# Patient Record
Sex: Female | Born: 1955 | Race: White | Hispanic: No | Marital: Married | Smoking: Never smoker
Health system: Southern US, Community
[De-identification: ages and names within clinical notes are randomized; demographics above are authoritative.]

## PROBLEM LIST (undated history)

## (undated) DIAGNOSIS — M1990 Unspecified osteoarthritis, unspecified site: Secondary | ICD-10-CM

## (undated) DIAGNOSIS — I6529 Occlusion and stenosis of unspecified carotid artery: Secondary | ICD-10-CM

## (undated) DIAGNOSIS — T7840XA Allergy, unspecified, initial encounter: Secondary | ICD-10-CM

## (undated) DIAGNOSIS — H269 Unspecified cataract: Secondary | ICD-10-CM

## (undated) DIAGNOSIS — M8580 Other specified disorders of bone density and structure, unspecified site: Secondary | ICD-10-CM

## (undated) DIAGNOSIS — R569 Unspecified convulsions: Secondary | ICD-10-CM

## (undated) DIAGNOSIS — G709 Myoneural disorder, unspecified: Secondary | ICD-10-CM

## (undated) HISTORY — DX: Unspecified cataract: H26.9

## (undated) HISTORY — DX: Allergy, unspecified, initial encounter: T78.40XA

## (undated) HISTORY — DX: Unspecified osteoarthritis, unspecified site: M19.90

## (undated) HISTORY — DX: Other specified disorders of bone density and structure, unspecified site: M85.80

## (undated) HISTORY — DX: Occlusion and stenosis of unspecified carotid artery: I65.29

## (undated) HISTORY — DX: Unspecified convulsions: R56.9

---

## 1998-02-28 ENCOUNTER — Other Ambulatory Visit: Admission: RE | Admit: 1998-02-28 | Discharge: 1998-02-28 | Payer: Self-pay | Admitting: Obstetrics and Gynecology

## 1998-03-13 ENCOUNTER — Other Ambulatory Visit: Admission: RE | Admit: 1998-03-13 | Discharge: 1998-03-13 | Payer: Self-pay | Admitting: Obstetrics and Gynecology

## 1998-09-10 ENCOUNTER — Ambulatory Visit (HOSPITAL_COMMUNITY): Admission: RE | Admit: 1998-09-10 | Discharge: 1998-09-10 | Payer: Self-pay | Admitting: Obstetrics and Gynecology

## 1998-09-10 ENCOUNTER — Encounter: Payer: Self-pay | Admitting: Obstetrics and Gynecology

## 1999-07-30 ENCOUNTER — Other Ambulatory Visit: Admission: RE | Admit: 1999-07-30 | Discharge: 1999-07-30 | Payer: Self-pay | Admitting: Obstetrics and Gynecology

## 1999-11-15 ENCOUNTER — Encounter: Payer: Self-pay | Admitting: Obstetrics and Gynecology

## 1999-11-15 ENCOUNTER — Ambulatory Visit (HOSPITAL_COMMUNITY): Admission: RE | Admit: 1999-11-15 | Discharge: 1999-11-15 | Payer: Self-pay | Admitting: Obstetrics and Gynecology

## 2000-08-27 ENCOUNTER — Other Ambulatory Visit: Admission: RE | Admit: 2000-08-27 | Discharge: 2000-08-27 | Payer: Self-pay | Admitting: Obstetrics and Gynecology

## 2000-09-07 ENCOUNTER — Encounter: Admission: RE | Admit: 2000-09-07 | Discharge: 2000-09-07 | Payer: Self-pay | Admitting: Obstetrics and Gynecology

## 2000-09-07 ENCOUNTER — Encounter: Payer: Self-pay | Admitting: Obstetrics and Gynecology

## 2001-11-01 ENCOUNTER — Other Ambulatory Visit: Admission: RE | Admit: 2001-11-01 | Discharge: 2001-11-01 | Payer: Self-pay | Admitting: Obstetrics and Gynecology

## 2002-09-08 ENCOUNTER — Encounter: Payer: Self-pay | Admitting: Obstetrics and Gynecology

## 2002-09-08 ENCOUNTER — Encounter: Admission: RE | Admit: 2002-09-08 | Discharge: 2002-09-08 | Payer: Self-pay | Admitting: Obstetrics and Gynecology

## 2002-11-28 ENCOUNTER — Other Ambulatory Visit: Admission: RE | Admit: 2002-11-28 | Discharge: 2002-11-28 | Payer: Self-pay | Admitting: Obstetrics and Gynecology

## 2003-07-21 ENCOUNTER — Encounter: Admission: RE | Admit: 2003-07-21 | Discharge: 2003-07-21 | Payer: Self-pay | Admitting: Family Medicine

## 2003-11-22 ENCOUNTER — Encounter: Admission: RE | Admit: 2003-11-22 | Discharge: 2003-11-22 | Payer: Self-pay | Admitting: Obstetrics and Gynecology

## 2003-11-27 ENCOUNTER — Encounter: Admission: RE | Admit: 2003-11-27 | Discharge: 2003-11-27 | Payer: Self-pay | Admitting: Obstetrics and Gynecology

## 2003-12-11 ENCOUNTER — Other Ambulatory Visit: Admission: RE | Admit: 2003-12-11 | Discharge: 2003-12-11 | Payer: Self-pay | Admitting: Obstetrics and Gynecology

## 2004-11-29 ENCOUNTER — Encounter: Admission: RE | Admit: 2004-11-29 | Discharge: 2004-11-29 | Payer: Self-pay | Admitting: Obstetrics and Gynecology

## 2005-10-06 ENCOUNTER — Encounter: Admission: RE | Admit: 2005-10-06 | Discharge: 2005-10-06 | Payer: Self-pay | Admitting: Obstetrics and Gynecology

## 2006-11-02 ENCOUNTER — Encounter: Admission: RE | Admit: 2006-11-02 | Discharge: 2006-11-02 | Payer: Self-pay | Admitting: Obstetrics and Gynecology

## 2007-11-05 ENCOUNTER — Encounter: Admission: RE | Admit: 2007-11-05 | Discharge: 2007-11-05 | Payer: Self-pay | Admitting: Obstetrics and Gynecology

## 2008-07-01 ENCOUNTER — Emergency Department (HOSPITAL_COMMUNITY): Admission: EM | Admit: 2008-07-01 | Discharge: 2008-07-01 | Payer: Self-pay | Admitting: Emergency Medicine

## 2008-07-05 HISTORY — PX: ANKLE FRACTURE SURGERY: SHX122

## 2008-12-15 ENCOUNTER — Encounter: Admission: RE | Admit: 2008-12-15 | Discharge: 2008-12-15 | Payer: Self-pay | Admitting: Obstetrics and Gynecology

## 2009-03-02 ENCOUNTER — Encounter: Payer: Self-pay | Admitting: Internal Medicine

## 2009-12-18 ENCOUNTER — Encounter: Admission: RE | Admit: 2009-12-18 | Discharge: 2009-12-18 | Payer: Self-pay | Admitting: Obstetrics and Gynecology

## 2012-02-02 ENCOUNTER — Other Ambulatory Visit: Payer: Self-pay | Admitting: Obstetrics

## 2012-02-02 DIAGNOSIS — Z1231 Encounter for screening mammogram for malignant neoplasm of breast: Secondary | ICD-10-CM

## 2012-03-04 ENCOUNTER — Ambulatory Visit
Admission: RE | Admit: 2012-03-04 | Discharge: 2012-03-04 | Disposition: A | Discharge status: Home / Self Care | Payer: BC Managed Care – PPO | Source: Ambulatory Visit | Attending: Obstetrics | Admitting: Obstetrics

## 2012-03-04 DIAGNOSIS — Z1231 Encounter for screening mammogram for malignant neoplasm of breast: Secondary | ICD-10-CM

## 2012-12-28 ENCOUNTER — Other Ambulatory Visit: Payer: Self-pay

## 2012-12-28 DIAGNOSIS — Z1231 Encounter for screening mammogram for malignant neoplasm of breast: Secondary | ICD-10-CM

## 2013-01-06 ENCOUNTER — Telehealth: Payer: Self-pay | Admitting: Nurse Practitioner

## 2013-01-06 NOTE — Telephone Encounter (Signed)
Spoke to patient explaining to her we do not make referrals to PCP's. She is fine with that and will see [place] in Dec 2014 for her follow-up appt with cm.

## 2013-02-17 ENCOUNTER — Telehealth: Payer: Self-pay | Admitting: Nurse Practitioner

## 2013-02-18 ENCOUNTER — Other Ambulatory Visit: Payer: Self-pay | Admitting: *Deleted

## 2013-02-18 ENCOUNTER — Encounter: Payer: Self-pay | Admitting: Diagnostic Neuroimaging

## 2013-02-24 ENCOUNTER — Other Ambulatory Visit: Payer: Self-pay

## 2013-02-24 MED ORDER — PHENOBARBITAL 64.8 MG PO TABS: 129.6000 mg | ORAL_TABLET | Freq: Every day | ORAL | Status: DC

## 2013-02-24 NOTE — Telephone Encounter (Signed)
Rx signed and faxed.

## 2013-02-24 NOTE — Telephone Encounter (Signed)
Dr Marjory Lies is out of the office, forwarding request to Dr Anne Hahn, [facility]

## 2013-03-10 ENCOUNTER — Ambulatory Visit
Admission: RE | Admit: 2013-03-10 | Discharge: 2013-03-10 | Disposition: A | Discharge status: Home / Self Care | Payer: BC Managed Care – PPO | Source: Ambulatory Visit

## 2013-03-10 DIAGNOSIS — Z1231 Encounter for screening mammogram for malignant neoplasm of breast: Secondary | ICD-10-CM

## 2013-05-17 ENCOUNTER — Encounter: Payer: Self-pay | Admitting: Internal Medicine

## 2013-06-23 ENCOUNTER — Encounter: Payer: Self-pay | Admitting: Nurse Practitioner

## 2013-06-24 ENCOUNTER — Encounter (INDEPENDENT_AMBULATORY_CARE_PROVIDER_SITE_OTHER): Payer: Self-pay

## 2013-06-24 ENCOUNTER — Encounter: Payer: Self-pay | Admitting: Nurse Practitioner

## 2013-06-24 ENCOUNTER — Ambulatory Visit (INDEPENDENT_AMBULATORY_CARE_PROVIDER_SITE_OTHER): Payer: BC Managed Care – PPO | Admitting: Nurse Practitioner

## 2013-06-24 ENCOUNTER — Telehealth: Payer: Self-pay

## 2013-06-24 VITALS — BP 119/74 | HR 63 | Ht 64.5 in | Wt 156.0 lb

## 2013-06-24 DIAGNOSIS — Z79899 Other long term (current) drug therapy: Secondary | ICD-10-CM

## 2013-06-24 DIAGNOSIS — G40209 Localization-related (focal) (partial) symptomatic epilepsy and epileptic syndromes with complex partial seizures, not intractable, without status epilepticus: Secondary | ICD-10-CM | POA: Insufficient documentation

## 2013-06-24 DIAGNOSIS — G40309 Generalized idiopathic epilepsy and epileptic syndromes, not intractable, without status epilepticus: Secondary | ICD-10-CM | POA: Insufficient documentation

## 2013-06-24 MED ORDER — CARBAMAZEPINE ER 400 MG PO TB12: 400.0000 mg | ORAL_TABLET | Freq: Two times a day (BID) | ORAL | Status: DC

## 2013-06-24 MED ORDER — PHENOBARBITAL 64.8 MG PO TABS: 129.6000 mg | ORAL_TABLET | Freq: Every day | ORAL | Status: DC

## 2013-06-24 NOTE — Patient Instructions (Signed)
Continue Tegretol at current dose will refill Continue Phenobarb at current dose will refill Will check blood levels today F/U yearly and prn

## 2013-06-24 NOTE — Telephone Encounter (Signed)
Rx faxed

## 2013-06-24 NOTE — Progress Notes (Signed)
[facility]  PATIENT: Brenda Woods DOB: [birth year]   REASON FOR VISIT: Followup for seizure disorder   HISTORY OF PRESENT ILLNESS: Brenda Woods, 57 year old female returns for followup she has a history of seizure disorder with last seizure occurring in 1990. She is currently well-controlled on Tegretol and phenobarbital. She had recent labs at her primary care however she has not had levels of her drugs checked. She needs refills on her medications. She is here for her yearly evaluation  HISTORY: She had a history of seizure disorder that is well controlled on Tegretol and Phenobarbital.  Her last seizure occurred in 1990.  Denies side effects to her medication. She had been followed at [facility] since 1968 with onset of seizures at age 29. She has had multiple trials of medications. She has no other significant medical problems.  REVIEW OF SYSTEMS: Full 14 system review of systems performed and notable only for those listed, all others are neg:  Constitutional: N/A  Cardiovascular: N/A  Ear/Nose/Throat: N/A  Skin: N/A  Eyes: N/A  Respiratory: N/A  Gastroitestinal: N/A  Hematology/Lymphatic: N/A  Endocrine: N/A Musculoskeletal:joint pain, back pain Allergy/Immunology: N/A  Neurological: N/A Psychiatric: N/A   ALLERGIES: No Known Allergies  HOME MEDICATIONS: Outpatient Prescriptions Prior to Visit  Medication Sig Dispense Refill  . calcium citrate-vitamin D (CITRACAL+D) 315-200 MG-UNIT per tablet Take 1 tablet by mouth 2 (two) times daily.      . carbamazepine (TEGRETOL XR) 400 MG 12 hr tablet Take 400 mg by mouth 2 (two) times daily.      Marland Kitchen NUTRITIONAL SUPPLEMENTS PO Take by mouth. Equate Liquid      . PHENobarbital (LUMINAL) 64.8 MG tablet Take 2 tablets (129.6 mg total) by mouth at bedtime.  180 tablet  1  . diphenhydrAMINE (BENADRYL) 25 mg capsule Take 25 mg by mouth 2 (two) times daily.       No facility-administered medications prior to visit.    PAST  MEDICAL HISTORY: Past Medical History  Diagnosis Date  . Seizure     PAST SURGICAL HISTORY: Past Surgical History  Procedure Laterality Date  . Ankle fracture surgery  07-05-89    FAMILY HISTORY: No family history on file.  SOCIAL HISTORY: History   Social History  . Marital Status: Married    Spouse Name: Casimiro Needle    Number of Children: 0  . Years of Education: 12   Occupational History  .  [employer]   Social History Main Topics  . Smoking status: Never Smoker   . Smokeless tobacco: Never Used  . Alcohol Use: No  . Drug Use: No  . Sexual Activity: Not on file   Other Topics Concern  . Not on file   Social History Narrative   Patient is married to Notus.    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    Patient has a high school education.   Patient is right-handed.   Patient drinks about 2 glasses of soda daily.     PHYSICAL EXAM  Filed Vitals:   06/24/13 0924  BP: 119/74  Pulse: 63  Height: 5' 4.5" (1.638 m)  Weight: 156 lb (70.761 kg)   Body mass index is 26.37 kg/(m^2).  Generalized: Well developed, in no acute distress  Neurological examination   Mentation: Alert oriented to time, place, history taking. Follows all commands speech and language fluent  Cranial nerve II-XII: Pupils were equal  round reactive to light extraocular movements were full, visual field were full on confrontational test. Facial sensation and strength were normal. hearing was intact to finger rubbing bilaterally. Uvula tongue midline. head turning and shoulder shrug were normal and symmetric.Tongue protrusion into cheek strength was normal. Motor: normal bulk and tone, full strength in the BUE, BLE, fine finger movements normal, no pronator drift. No focal weakness Coordination: finger-nose-finger, heel-to-shin bilaterally, no dysmetria Reflexes: 1+ upper lower and symmetric Gait and Station: Rising  up from seated position without assistance, normal stance,  moderate stride, good arm swing, smooth turning, able to perform tiptoe, and heel walking without difficulty. Tandem gait is steady  DIAGNOSTIC DATA (LABS, IMAGING, TESTING) -recent labs have been done not in EPIC    ASSESSMENT AND PLAN  57 y.o. year old female  has a past medical history of Seizure. here to follow up.   Continue Tegretol at current dose will refill Continue Phenobarb at current dose will refill Will check blood levels today F/U yearly and prn Nilda Riggs, [facility], [facility], APRN  [facility] [address] [place], [place] [address] [phone]

## 2013-06-27 NOTE — Progress Notes (Signed)
Quick Note:  Left message that labs look good, per Eber Jones. Told to call office with any questions. ______

## 2013-07-22 ENCOUNTER — Ambulatory Visit (AMBULATORY_SURGERY_CENTER): Payer: Self-pay | Admitting: *Deleted

## 2013-07-22 VITALS — Ht 64.0 in | Wt 155.2 lb

## 2013-07-22 DIAGNOSIS — Z1211 Encounter for screening for malignant neoplasm of colon: Secondary | ICD-10-CM

## 2013-07-22 MED ORDER — PEG-KCL-NACL-NASULF-NA ASC-C 100 G PO SOLR: ORAL | Status: DC

## 2013-07-22 NOTE — Progress Notes (Signed)
I reviewed note and agree with plan.   Penni Bombard, MD 1/54/0086, 7:61 PM Certified in Neurology, Neurophysiology and Neuroimaging  [facility] [address], [place], [place] [address] [phone]

## 2013-07-22 NOTE — Progress Notes (Signed)
No egg or soy allergy  Pt has hx of seizures- last one 1991

## 2013-07-25 ENCOUNTER — Encounter: Payer: Self-pay | Admitting: Internal Medicine

## 2013-07-29 ENCOUNTER — Telehealth: Payer: Self-pay | Admitting: Nurse Practitioner

## 2013-08-01 NOTE — Telephone Encounter (Signed)
Patient was called again, left message concerning her labs results being normal. I advised the patient that if she has any other problems, questions or concerns to call the office.

## 2013-08-05 ENCOUNTER — Encounter: Payer: Self-pay | Admitting: Internal Medicine

## 2013-08-05 ENCOUNTER — Ambulatory Visit (AMBULATORY_SURGERY_CENTER): Payer: BC Managed Care – PPO | Admitting: Internal Medicine

## 2013-08-05 VITALS — BP 121/61 | HR 62 | Temp 97.4°F | Resp 14 | Ht 64.0 in | Wt 155.0 lb

## 2013-08-05 DIAGNOSIS — D126 Benign neoplasm of colon, unspecified: Secondary | ICD-10-CM

## 2013-08-05 DIAGNOSIS — Z1211 Encounter for screening for malignant neoplasm of colon: Secondary | ICD-10-CM

## 2013-08-05 MED ORDER — SODIUM CHLORIDE 0.9 % IV SOLN: 500.0000 mL | INTRAVENOUS | Status: DC

## 2013-08-05 NOTE — Patient Instructions (Signed)
YOU HAD AN ENDOSCOPIC PROCEDURE TODAY AT THE [facility]: Refer to the procedure report that was given to you for any specific questions about what was found during the examination.  If the procedure report does not answer your questions, please call your gastroenterologist to clarify.  If you requested that your care partner not be given the details of your procedure findings, then the procedure report has been included in a sealed envelope for you to review at your convenience later.  YOU SHOULD EXPECT: Some feelings of bloating in the abdomen. Passage of more gas than usual.  Walking can help get rid of the air that was put into your GI tract during the procedure and reduce the bloating. If you had a lower endoscopy (such as a colonoscopy or flexible sigmoidoscopy) you may notice spotting of blood in your stool or on the toilet paper. If you underwent a bowel prep for your procedure, then you may not have a normal bowel movement for a few days.  DIET: Your first meal following the procedure should be a light meal and then it is ok to progress to your normal diet.  A half-sandwich or bowl of soup is an example of a good first meal.  Heavy or fried foods are harder to digest and may make you feel nauseous or bloated.  Likewise meals heavy in dairy and vegetables can cause extra gas to form and this can also increase the bloating.  Drink plenty of fluids but you should avoid alcoholic beverages for 24 hours.  High fiber diet.  ACTIVITY: Your care partner should take you home directly after the procedure.  You should plan to take it easy, moving slowly for the rest of the day.  You can resume normal activity the day after the procedure however you should NOT DRIVE or use heavy machinery for 24 hours (because of the sedation medicines used during the test).    SYMPTOMS TO REPORT IMMEDIATELY: A gastroenterologist can be reached at any hour.  During normal business hours, 8:30 AM to 5:00 PM Monday  through Friday, call [phone].  After hours and on weekends, please call the GI answering service at [phone] who will take a message and have the physician on call contact you.   Following lower endoscopy (colonoscopy or flexible sigmoidoscopy):  Excessive amounts of blood in the stool  Significant tenderness or worsening of abdominal pains  Swelling of the abdomen that is new, acute  Fever of 100F or higher  FOLLOW UP: If any biopsies were taken you will be contacted by phone or by letter within the next 1-3 weeks.  Call your gastroenterologist if you have not heard about the biopsies in 3 weeks.  Our staff will call the home number listed on your records the next business day following your procedure to check on you and address any questions or concerns that you may have at that time regarding the information given to you following your procedure. This is a courtesy call and so if there is no answer at the home number and we have not heard from you through the emergency physician on call, we will assume that you have returned to your regular daily activities without incident.  SIGNATURES/CONFIDENTIALITY: You and/or your care partner have signed paperwork which will be entered into your electronic medical record.  These signatures attest to the fact that that the information above on your After Visit Summary has been reviewed and is understood.  Full responsibility of   of the confidentiality of this discharge information lies with you and/or your care-partner.

## 2013-08-05 NOTE — Progress Notes (Signed)
Called to room to assist during endoscopic procedure.  Patient ID and intended procedure confirmed with present staff. Received instructions for my participation in the procedure from the performing physician.  

## 2013-08-05 NOTE — Op Note (Signed)
[place]  [address] [place], [id number]   COLONOSCOPY PROCEDURE REPORT  PATIENT: Brenda Woods, Brenda Woods  MR#: [id number] BIRTHDATE: 1956-05-20 , 53  yrs. old GENDER: Female ENDOSCOPIST: Lafayette Dragon, MD REFERRED NT:[id number] Ernie Hew, M.D. PROCEDURE DATE:  08/05/2013 PROCEDURE:   Colonoscopy with cold biopsy polypectomy First Screening Colonoscopy - Avg.  risk and is 50 yrs.  old or older Yes.  Prior Negative Screening - Now for repeat screening. N/A  History of Adenoma - Now for follow-up colonoscopy & has been > or = to 3 yrs.  N/A  Polyps Removed Today? Yes. ASA CLASS:   Class I INDICATIONS:Average risk patient for colon cancer. MEDICATIONS: MAC sedation, administered by CRNA and propofol (Diprivan) 300mg  IV  DESCRIPTION OF PROCEDURE:   After the risks benefits and alternatives of the procedure were thoroughly explained, informed consent was obtained.  A digital rectal exam revealed no abnormalities of the rectum.   The LB [id number]  endoscope was introduced through the anus and advanced to the cecum, which was identified by both the appendix and ileocecal valve. No adverse events experienced.   The quality of the prep was good, using MoviPrep  The instrument was then slowly withdrawn as the colon was fully examined.      COLON FINDINGS: A diminutive sessile polyp was found in the ascending colon.  A polypectomy was performed with cold forceps. The resection was complete and the polyp tissue was completely retrieved.  Retroflexed views revealed no abnormalities. The time to cecum=8 minutes 35 seconds.  Withdrawal time=6 minutes 15 seconds.  The scope was withdrawn and the procedure completed. COMPLICATIONS: There were no complications.  ENDOSCOPIC IMPRESSION: Diminutive sessile polyp was found in the ascending colon; polypectomy was performed with cold forceps  RECOMMENDATIONS: 1.  Await pathology results 2.  high-fiber diet Recall colonoscopy  pending pathology report   eSigned:  Lafayette Dragon, MD 08/05/2013 11:58 AM   cc:   PATIENT NAME:  Zyann, Mabry MR#: [id number]

## 2013-08-08 ENCOUNTER — Telehealth: Payer: Self-pay | Admitting: *Deleted

## 2013-08-08 NOTE — Telephone Encounter (Signed)
  Follow up Call-  Call back number 08/05/2013  Post procedure Call Back phone  # [phone], [phone]  Permission to leave phone message Yes     Patient questions:  Do you have a fever, pain , or abdominal swelling? no Pain Score  0 *  Have you tolerated food without any problems? yes  Have you been able to return to your normal activities? yes  Do you have any questions about your discharge instructions: Diet   no Medications  no Follow up visit  no  Do you have questions or concerns about your Care? no  Actions: * If pain score is 4 or above: No action needed, pain <4.

## 2013-08-09 ENCOUNTER — Encounter: Payer: Self-pay | Admitting: Internal Medicine

## 2013-08-10 ENCOUNTER — Encounter: Payer: Self-pay | Admitting: *Deleted

## 2013-11-05 ENCOUNTER — Inpatient Hospital Stay (HOSPITAL_COMMUNITY)
Admission: EM | Admit: 2013-11-05 | Discharge: 2013-11-07 | DRG: 101 | Disposition: A | Discharge status: Home / Self Care | Payer: BC Managed Care – PPO | Attending: Family Medicine | Admitting: Family Medicine

## 2013-11-05 ENCOUNTER — Encounter (HOSPITAL_COMMUNITY): Payer: Self-pay | Admitting: Emergency Medicine

## 2013-11-05 DIAGNOSIS — G40209 Localization-related (focal) (partial) symptomatic epilepsy and epileptic syndromes with complex partial seizures, not intractable, without status epilepticus: Secondary | ICD-10-CM

## 2013-11-05 DIAGNOSIS — R569 Unspecified convulsions: Secondary | ICD-10-CM | POA: Diagnosis present

## 2013-11-05 DIAGNOSIS — G40309 Generalized idiopathic epilepsy and epileptic syndromes, not intractable, without status epilepticus: Principal | ICD-10-CM | POA: Diagnosis present

## 2013-11-05 DIAGNOSIS — M129 Arthropathy, unspecified: Secondary | ICD-10-CM | POA: Diagnosis present

## 2013-11-05 DIAGNOSIS — R918 Other nonspecific abnormal finding of lung field: Secondary | ICD-10-CM | POA: Diagnosis present

## 2013-11-05 DIAGNOSIS — E860 Dehydration: Secondary | ICD-10-CM | POA: Diagnosis present

## 2013-11-05 DIAGNOSIS — Z803 Family history of malignant neoplasm of breast: Secondary | ICD-10-CM

## 2013-11-05 LAB — CBG MONITORING, ED
Glucose-Capillary: 124 mg/dL — ABNORMAL HIGH (ref 70–99)
Glucose-Capillary: 121 mg/dL — ABNORMAL HIGH (ref 70–99)

## 2013-11-05 LAB — PHENOBARBITAL LEVEL: Phenobarbital: 20.6 ug/mL (ref 15.0–40.0)

## 2013-11-05 LAB — CARBAMAZEPINE LEVEL, TOTAL: Carbamazepine Lvl: 4.5 ug/mL (ref 4.0–12.0)

## 2013-11-05 MED ORDER — LORAZEPAM 2 MG/ML IJ SOLN
2.0000 mg | Freq: Once | INTRAMUSCULAR | Status: AC
  Administered 2013-11-05: 2 mg via INTRAVENOUS

## 2013-11-05 MED ORDER — LORAZEPAM 2 MG/ML IJ SOLN
INTRAMUSCULAR | Status: AC
  Filled 2013-11-05: qty 1

## 2013-11-05 NOTE — ED Provider Notes (Signed)
CSN: [id number]     Arrival date & time 11/05/13  1941 History   First MD Initiated Contact with Patient 11/05/13 2002     Chief Complaint  Patient presents with  . Seizures     (Consider location/radiation/quality/duration/timing/severity/associated sxs/prior Treatment) HPI 58 year old female with history of seizure disorder last seizure over 20 years ago presents with a witnessed generalized seizure with no trauma today, she was shopping at [facility] with her significant other when the patient was witnessed to start shaking and slid to the ground with the assistance of her significant other to make sure she did not her head, patient then had a few minutes of generalized shaking movements with abnormal breathing sounds and EMS was notified; patient then woke up and has been postictal essentially nonverbal since then except she is able to whisper her name and follow a few simple commands and she denies pain. There is no treatment prior to arrival. Past Medical History  Diagnosis Date  . Allergy   . Arthritis     elbows  . Seizure     last seizure 1991   Past Surgical History  Procedure Laterality Date  . Ankle fracture surgery  07-05-08   Family History  Problem Relation Age of Onset  . Breast cancer Maternal Aunt   . Colon cancer Neg Hx   . Esophageal cancer Neg Hx   . Rectal cancer Neg Hx   . Stomach cancer Neg Hx    History  Substance Use Topics  . Smoking status: Never Smoker   . Smokeless tobacco: Never Used  . Alcohol Use: No   OB History   Grav Para Term Preterm Abortions TAB SAB Ect Mult Living                 Review of Systems  Unable to perform ROS: Mental status change      Allergies  Review of patient's allergies indicates no known allergies.  Home Medications   Prior to Admission medications   Medication Sig Start Date End Date Taking? Authorizing Provider  calcium citrate-vitamin D (CITRACAL+D) 315-200 MG-UNIT per tablet Take 1 tablet by mouth 2  (two) times daily.    Historical Provider, MD  carbamazepine (TEGRETOL XR) 400 MG 12 hr tablet Take 1 tablet (400 mg total) by mouth 2 (two) times daily. 06/24/13   Dennie Bible, NP  Glucosamine HCl (GLUCOSAMINE PO) Take by mouth. Taking 3 chews daily.    Historical Provider, MD  loratadine (CLARITIN) 10 MG tablet Take 10 mg by mouth daily.    Historical Provider, MD  PHENobarbital (LUMINAL) 64.8 MG tablet Take 2 tablets (129.6 mg total) by mouth at bedtime. 06/24/13   Dennie Bible, NP   BP 133/69  Pulse 76  Temp(Src) 98.4 F (36.9 C) (Oral)  Resp 16  Ht 5\' 4"  (1.626 m)  Wt 158 lb 11.7 oz (72 kg)  BMI 27.23 kg/m2  SpO2 96% Physical Exam  Nursing note and vitals reviewed. Constitutional:  Awake, alert, nontoxic appearance with baseline speech for patient.  HENT:  Head: Atraumatic.  Mouth/Throat: No oropharyngeal exudate.  Bilateral lateral tongue biting with abrasions present consistent with seizure history  Eyes: EOM are normal. Pupils are equal, round, and reactive to light. Right eye exhibits no discharge. Left eye exhibits no discharge.  Neck: Neck supple.  Cervical spine nontender  Cardiovascular: Normal rate and regular rhythm.   No murmur heard. Pulmonary/Chest: Effort normal and breath sounds normal. No stridor. No respiratory distress. She  has no wheezes. She has no rales. She exhibits no tenderness.  Abdominal: Soft. Bowel sounds are normal. She exhibits no mass. There is no tenderness. There is no rebound.  Musculoskeletal: She exhibits no edema and no tenderness.  Baseline ROM, moves extremities with no obvious new focal weakness.  Lymphadenopathy:    She has no cervical adenopathy.  Neurological: She is alert.  Awake, alert, cooperative to follow simple commands oriented to person only and states her name answers other questions with yes/no nodding of her head; motor strength 4/5 bilaterally; sensation normal to light touch bilaterally; peripheral visual  fields full to confrontation; no facial asymmetry; tongue midline; major cranial nerves appear intact; no pronator drift, normal finger to nose bilaterally  Skin: No rash noted.  Psychiatric: She has a normal mood and affect.    ED Course  Procedures (including critical care time) RN witnessed Pt having seizure in ED after patient had woken up and was walking to the bathroom without injury; seizure now resolved and Pt sleeping after Ativan. 2145  2300- patient still postictal not following commands with Tegretol and phenobarbital levels in the low end of therapeutic range; [facility] paged for Obs and [facility] paged for recommendations.  Neuro recs increase Tegretol dose to 500mg  bid. 2340 D/w [facility] for admit. [place] Reviewed  CBC WITH DIFFERENTIAL - Abnormal; Notable for the following:    RBC 3.82 (*)    HCT 35.2 (*)    All other components within normal limits  COMPREHENSIVE METABOLIC PANEL - Abnormal; Notable for the following:    Albumin 3.0 (*)    Total Bilirubin <0.2 (*)    All other components within normal limits  CBC - Abnormal; Notable for the following:    RBC 3.47 (*)    Hemoglobin 11.0 (*)    HCT 32.0 (*)    All other components within normal limits  CBG MONITORING, ED - Abnormal; Notable for the following:    Glucose-Capillary 124 (*)    All other components within normal limits  CBG MONITORING, ED - Abnormal; Notable for the following:    Glucose-Capillary 121 (*)    All other components within normal limits  I-STAT CHEM 8, ED - Abnormal; Notable for the following:    Glucose, Bld 110 (*)    All other components within normal limits  URINE CULTURE  CARBAMAZEPINE LEVEL, TOTAL  PHENOBARBITAL LEVEL  URINALYSIS, ROUTINE W REFLEX MICROSCOPIC  MAGNESIUM  PHOSPHORUS  TSH    Imaging Review No results found.   EKG Interpretation None      MDM   Final diagnoses:  Seizure    The patient appears reasonably stabilized for admission  considering the current resources, flow, and capabilities available in the ED at this time, and I doubt any other [facility] requiring further screening and/or treatment in the ED prior to admission.    Babette Relic, MD 11/09/13 [phone]

## 2013-11-05 NOTE — ED Notes (Addendum)
CBG 124 

## 2013-11-05 NOTE — ED Notes (Signed)
Husband notes that pt. Was feeling weak at dinner.

## 2013-11-05 NOTE — ED Notes (Signed)
Per EMS, Pt had seizure in [employer]. Husband noticed wife start to slump against shelves, then pt. Slid to floor. No obvious injury from fall. Pt. Postictal. Pt has hx of seizures but has not had on in 25 years. Pt hasn't had medication change in years and consistently takes medication. Pt denies any pain but does not remember the event. No urination or obvious injury to tongue.

## 2013-11-05 NOTE — ED Notes (Signed)
Bed: WA06 Expected date:  Expected time:  Means of arrival:  Comments: EMS/58 yo female with seizure

## 2013-11-05 NOTE — ED Notes (Signed)
Pt walked to the bathroom and had seizure on the toilet. Pt taken back to bed and given 2mg  of ativan, per MD.

## 2013-11-06 ENCOUNTER — Inpatient Hospital Stay (HOSPITAL_COMMUNITY): Payer: BC Managed Care – PPO

## 2013-11-06 ENCOUNTER — Encounter (HOSPITAL_COMMUNITY): Payer: Self-pay | Admitting: Internal Medicine

## 2013-11-06 DIAGNOSIS — R918 Other nonspecific abnormal finding of lung field: Secondary | ICD-10-CM | POA: Diagnosis present

## 2013-11-06 DIAGNOSIS — E860 Dehydration: Secondary | ICD-10-CM

## 2013-11-06 DIAGNOSIS — G40309 Generalized idiopathic epilepsy and epileptic syndromes, not intractable, without status epilepticus: Principal | ICD-10-CM

## 2013-11-06 DIAGNOSIS — R569 Unspecified convulsions: Secondary | ICD-10-CM

## 2013-11-06 LAB — COMPREHENSIVE METABOLIC PANEL
ALBUMIN: 3 g/dL — AB (ref 3.5–5.2)
ALK PHOS: 93 U/L (ref 39–117)
ALT: 13 U/L (ref 0–35)
AST: 17 U/L (ref 0–37)
BUN: 15 mg/dL (ref 6–23)
CHLORIDE: 104 meq/L (ref 96–112)
CO2: 24 mEq/L (ref 19–32)
Calcium: 8.4 mg/dL (ref 8.4–10.5)
Creatinine, Ser: 0.58 mg/dL (ref 0.50–1.10)
GFR calc Af Amer: >90 mL/min (ref >90)
GFR calc non Af Amer: >90 mL/min (ref >90)
GLUCOSE: 92 mg/dL (ref 70–99)
POTASSIUM: 3.8 meq/L (ref 3.7–5.3)
SODIUM: 139 meq/L (ref 137–147)
Total Protein: 6.1 g/dL (ref 6.0–8.3)

## 2013-11-06 LAB — I-STAT CHEM 8, ED
BUN: 14 mg/dL (ref 6–23)
CREATININE: 0.6 mg/dL (ref 0.50–1.10)
Calcium, Ion: 1.14 mmol/L (ref 1.13–1.30)
Chloride: 101 mEq/L (ref 96–112)
Glucose, Bld: 110 mg/dL — ABNORMAL HIGH (ref 70–99)
HCT: 39 % (ref 36.0–46.0)
Hemoglobin: 13.3 g/dL (ref 12.0–15.0)
Potassium: 4 mEq/L (ref 3.7–5.3)
SODIUM: 138 meq/L (ref 137–147)
TCO2: 23 mmol/L (ref 0–100)

## 2013-11-06 LAB — CBC WITH DIFFERENTIAL/PLATELET
Basophils Absolute: 0 10*3/uL (ref 0.0–0.1)
Basophils Relative: 0 % (ref 0–1)
Eosinophils Absolute: 0.1 10*3/uL (ref 0.0–0.7)
Eosinophils Relative: 2 % (ref 0–5)
HEMATOCRIT: 35.2 % — AB (ref 36.0–46.0)
HEMOGLOBIN: 12.1 g/dL (ref 12.0–15.0)
Lymphocytes Relative: 25 % (ref 12–46)
Lymphs Abs: 1.7 10*3/uL (ref 0.7–4.0)
MCH: 31.7 pg (ref 26.0–34.0)
MCHC: 34.4 g/dL (ref 30.0–36.0)
MCV: 92.1 fL (ref 78.0–100.0)
Monocytes Absolute: 0.6 10*3/uL (ref 0.1–1.0)
Monocytes Relative: 8 % (ref 3–12)
Neutro Abs: 4.4 10*3/uL (ref 1.7–7.7)
Neutrophils Relative %: 65 % (ref 43–77)
Platelets: 287 10*3/uL (ref 150–400)
RBC: 3.82 MIL/uL — ABNORMAL LOW (ref 3.87–5.11)
RDW: 13.4 % (ref 11.5–15.5)
WBC: 6.8 10*3/uL (ref 4.0–10.5)

## 2013-11-06 LAB — URINALYSIS, ROUTINE W REFLEX MICROSCOPIC
BILIRUBIN URINE: NEGATIVE
Glucose, UA: NEGATIVE mg/dL
HGB URINE DIPSTICK: NEGATIVE
Ketones, ur: NEGATIVE mg/dL
Leukocytes, UA: NEGATIVE
Nitrite: NEGATIVE
PROTEIN: NEGATIVE mg/dL
SPECIFIC GRAVITY, URINE: 1.019 (ref 1.005–1.030)
Urobilinogen, UA: 0.2 mg/dL (ref 0.0–1.0)
pH: 7 (ref 5.0–8.0)

## 2013-11-06 LAB — TSH: TSH: 2 u[IU]/mL (ref 0.350–4.500)

## 2013-11-06 LAB — CBC
HCT: 32 % — ABNORMAL LOW (ref 36.0–46.0)
Hemoglobin: 11 g/dL — ABNORMAL LOW (ref 12.0–15.0)
MCH: 31.7 pg (ref 26.0–34.0)
MCHC: 34.4 g/dL (ref 30.0–36.0)
MCV: 92.2 fL (ref 78.0–100.0)
PLATELETS: 258 10*3/uL (ref 150–400)
RBC: 3.47 MIL/uL — ABNORMAL LOW (ref 3.87–5.11)
RDW: 13.5 % (ref 11.5–15.5)
WBC: 7.3 10*3/uL (ref 4.0–10.5)

## 2013-11-06 LAB — MAGNESIUM: Magnesium: 2.1 mg/dL (ref 1.5–2.5)

## 2013-11-06 LAB — PHOSPHORUS: Phosphorus: 3.8 mg/dL (ref 2.3–4.6)

## 2013-11-06 MED ORDER — ACETAMINOPHEN 325 MG PO TABS: 650.0000 mg | ORAL_TABLET | Freq: Four times a day (QID) | ORAL | Status: DC | PRN

## 2013-11-06 MED ORDER — SODIUM CHLORIDE 0.9 % IV SOLN
INTRAVENOUS | Status: AC
  Administered 2013-11-06: 03:00:00 via INTRAVENOUS

## 2013-11-06 MED ORDER — LORATADINE 10 MG PO TABS
10.0000 mg | ORAL_TABLET | Freq: Every day | ORAL | Status: DC
  Administered 2013-11-06 – 2013-11-07 (×2): 10 mg via ORAL
  Filled 2013-11-06 (×2): qty 1

## 2013-11-06 MED ORDER — ONDANSETRON HCL 4 MG/2ML IJ SOLN: 4.0000 mg | Freq: Four times a day (QID) | INTRAMUSCULAR | Status: DC | PRN

## 2013-11-06 MED ORDER — ACETAMINOPHEN 650 MG RE SUPP: 650.0000 mg | Freq: Four times a day (QID) | RECTAL | Status: DC | PRN

## 2013-11-06 MED ORDER — SODIUM CHLORIDE 0.9 % IJ SOLN
3.0000 mL | Freq: Two times a day (BID) | INTRAMUSCULAR | Status: DC
  Administered 2013-11-06 – 2013-11-07 (×3): 3 mL via INTRAVENOUS

## 2013-11-06 MED ORDER — HYDROCODONE-ACETAMINOPHEN 5-325 MG PO TABS: 1.0000 | ORAL_TABLET | ORAL | Status: DC | PRN

## 2013-11-06 MED ORDER — CARBAMAZEPINE ER 400 MG PO TB12
500.0000 mg | ORAL_TABLET | Freq: Two times a day (BID) | ORAL | Status: DC
  Administered 2013-11-06 – 2013-11-07 (×3): 500 mg via ORAL
  Filled 2013-11-06 (×4): qty 1

## 2013-11-06 MED ORDER — ONDANSETRON HCL 4 MG PO TABS: 4.0000 mg | ORAL_TABLET | Freq: Four times a day (QID) | ORAL | Status: DC | PRN

## 2013-11-06 MED ORDER — DOCUSATE SODIUM 100 MG PO CAPS
100.0000 mg | ORAL_CAPSULE | Freq: Two times a day (BID) | ORAL | Status: DC
  Administered 2013-11-06 – 2013-11-07 (×2): 100 mg via ORAL
  Filled 2013-11-06 (×4): qty 1

## 2013-11-06 MED ORDER — PHENOBARBITAL 97.2 MG PO TABS
129.6000 mg | ORAL_TABLET | Freq: Every day | ORAL | Status: DC
  Administered 2013-11-06: 129.6 mg via ORAL
  Filled 2013-11-06 (×2): qty 1

## 2013-11-06 NOTE — H&P (Signed)
PCP: Rachell Cipro, MD    Chief Complaint:  seizure  HPI: Brenda Woods is a 58 y.o. female   has a past medical history of Allergy; Arthritis; and Seizure.   Presented with  Patient has hx of Seizure Do last seizure was in 1991. Patient stated she felt weak and unwell. They were shopping in [place] and collapsed. And started to have mal grand seizure. No incontinence patient was postictal thereafter. She was brought to ER and was doing better when she had an other episode. Neuro Hospitalist was consulted and recommended increasing Tegretol to 500 mg po BID. Family denies any fever or neurological complaints. As any coughing no chest pain or shortness of breath. No loss of weight. Patient is compliant with medications.    Hospitalist was called for admission for seizure  Review of Systems:    Pertinent positives include: fatigue,   Constitutional:  No weight loss, night sweats, Fevers, chills, weight loss  HEENT:  No headaches, Difficulty swallowing,Tooth/dental problems,Sore throat,  No sneezing, itching, ear ache, nasal congestion, post nasal drip,  Cardio-vascular:  No chest pain, Orthopnea, PND, anasarca, dizziness, palpitations.no Bilateral lower extremity swelling  GI:  No heartburn, indigestion, abdominal pain, nausea, vomiting, diarrhea, change in bowel habits, loss of appetite, melena, blood in stool, hematemesis Resp:  no shortness of breath at rest. No dyspnea on exertion, No excess mucus, no productive cough, No non-productive cough, No coughing up of blood.No change in color of mucus.No wheezing. Skin:  no rash or lesions. No jaundice GU:  no dysuria, change in color of urine, no urgency or frequency. No straining to urinate.  No flank pain.  Musculoskeletal:  No joint pain or no joint swelling. No decreased range of motion. No back pain.  Psych:  No change in mood or affect. No depression or anxiety. No memory loss.  Neuro: no localizing neurological  complaints, no tingling, no weakness, no double vision, no gait abnormality, no slurred speech, no confusion  Otherwise ROS are negative except for above, 10 systems were reviewed  Past Medical History: Past Medical History  Diagnosis Date  . Allergy   . Arthritis     elbows  . Seizure     last seizure 1991   Past Surgical History  Procedure Laterality Date  . Ankle fracture surgery  07-05-08     Medications: Prior to Admission medications   Medication Sig Start Date End Date Taking? Authorizing Provider  calcium citrate-vitamin D (CITRACAL+D) 315-200 MG-UNIT per tablet Take 1 tablet by mouth 2 (two) times daily.   Yes Historical Provider, MD  carbamazepine (TEGRETOL XR) 400 MG 12 hr tablet Take 1 tablet (400 mg total) by mouth 2 (two) times daily. 06/24/13  Yes Dennie Bible, NP  Glucosamine HCl (GLUCOSAMINE PO) Take by mouth. Taking 3 chews daily.   Yes Historical Provider, MD  loratadine (CLARITIN) 10 MG tablet Take 10 mg by mouth daily.   Yes Historical Provider, MD  PHENobarbital (LUMINAL) 64.8 MG tablet Take 2 tablets (129.6 mg total) by mouth at bedtime. 06/24/13  Yes Dennie Bible, NP    Allergies:  No Known Allergies  Social History:  Ambulatory independently  Lives at home With family   reports that she has never smoked. She has never used smokeless tobacco. She reports that she does not drink alcohol or use illicit drugs.    Family History: family history includes Breast cancer in her maternal aunt. There is no history of Colon cancer, Esophageal cancer, Rectal cancer,  or Stomach cancer.    Physical Exam: Patient Vitals for the past 24 hrs:  BP Temp Temp src Pulse Resp SpO2  11/06/13 0015 119/58 mmHg - - 81 18 97 %  11/06/13 0000 132/71 mmHg - - 103 11 99 %  11/05/13 2345 131/72 mmHg - - 98 13 99 %  11/05/13 2330 140/66 mmHg - - 104 20 99 %  11/05/13 2315 130/68 mmHg - - 100 14 99 %  11/05/13 2300 134/58 mmHg - - 52 13 97 %  11/05/13 2245  110/61 mmHg - - 107 22 99 %  11/05/13 2132 - 98.5 F (36.9 C) Rectal - - -  11/05/13 2126 142/59 mmHg - - 99 22 99 %  11/05/13 1949 135/68 mmHg 98.1 F (36.7 C) Oral 94 18 95 %  11/05/13 1944 - - - - - 95 %    1. General:  in No Acute distress 2. Psychological: Alert and   Oriented 3. Head/ENT:   Moist   Mucous Membranes                          Head Non traumatic, neck supple                          Normal   Dentition 4. SKIN: normal  Skin turgor,  Skin clean Dry and intact no rash 5. Heart: Regular rate and rhythm no Murmur, Rub or gallop 6. Lungs:  no wheezes or crackles  diminished on the left 7. Abdomen: Soft, non-tender, Non distended 8. Lower extremities: no clubbing, cyanosis, or edema 9. Neurologically strength 5 out of 5 in all 4 extremities cranial nerves II through XII intact 10. MSK: Normal range of motion  body mass index is unknown because there is no weight on file.   Labs on Admission:   Recent Labs  11/06/13 0011 11/06/13 0018  NA  --  138  K  --  4.0  CL  --  101  GLUCOSE  --  110*  BUN  --  14  CREATININE  --  0.60  MG 2.1  --    No results found for this basename: AST, ALT, ALKPHOS, BILITOT, PROT, ALBUMIN,  in the last 72 hours No results found for this basename: LIPASE, AMYLASE,  in the last 72 hours  Recent Labs  11/06/13 0011 11/06/13 0018  WBC 6.8  --   NEUTROABS 4.4  --   HGB 12.1 13.3  HCT 35.2* 39.0  MCV 92.1  --   PLT 287  --    No results found for this basename: CKTOTAL, CKMB, CKMBINDEX, TROPONINI,  in the last 72 hours No results found for this basename: TSH, T4TOTAL, FREET3, T3FREE, THYROIDAB,  in the last 72 hours No results found for this basename: VITAMINB12, FOLATE, FERRITIN, TIBC, IRON, RETICCTPCT,  in the last 72 hours No results found for this basename: HGBA1C    The CrCl is unknown because both a height and weight (above a minimum accepted value) are required for this calculation. ABG    Component Value Date/Time    TCO2 23 11/06/2013 0018     No results found for this basename: DDIMER     Other results:   BNP (last 3 results) No results found for this basename: PROBNP,  in the last 8760 hours  There were no vitals filed for this visit.   Cultures: No results found for this  basename: sdes, specrequest, cult, reptstatus    Radiological Exams on Admission: No results found.  Chart has been reviewed  Assessment/Plan  She is a 49-year-old female with history of seizure disorder and no history of seizures for the past 24 years. Presents with generalized tonic-clonic seizure x2  Present on Admission:  . Generalized convulsive epilepsy without mention of intractable epilepsy - ER has discussed this with near hospital as recommended increasing Tegretol 500 mg twice a day. We'll obtain CT of the head for completion given change in stable course. Obtain UA. Magnesium level within normal limits. Patient appears to be clinically dehydrated we'll give IV fluids.  . Lung field abnormal finding on examination - patient has diminished air movement on the left. Obtain chest x-ray  . Dehydration give IV fluids check orthostatics in a.m.   Prophylaxis: SCDs ,   CODE STATUS:  FULL CODE   Other plan as per orders.  I have spent a total of 55 min on this admission  Liam Cammarata 11/06/2013, 12:43 AM

## 2013-11-06 NOTE — Progress Notes (Signed)
Patient seen and evaluated earlier this AM by my associate. Please refer to her H and P regarding assessment and plan.  Will reassess next am.  Velvet Bathe

## 2013-11-06 NOTE — ED Notes (Signed)
Pt asking to urinate. Unable to go in bedpan. Bedside commode placed in room. Pt assisted and able to urinate.

## 2013-11-07 ENCOUNTER — Telehealth: Payer: Self-pay | Admitting: Nurse Practitioner

## 2013-11-07 LAB — URINE CULTURE: Colony Count: 9000

## 2013-11-07 MED ORDER — CARBAMAZEPINE ER 100 MG PO TB12: 500.0000 mg | ORAL_TABLET | Freq: Two times a day (BID) | ORAL | Status: DC

## 2013-11-07 NOTE — Telephone Encounter (Signed)
Needs to let Brenda Woods know she had a seizure over the weekend in a store and was taken to [facility]. The increased her medication on carbamazepine (TEGRETOL XR) 100 MG 12 hr tablet to 500mg  a day and she needs a RX sent for the increase sent to her pharmacy she thinks its [facility].

## 2013-11-07 NOTE — Discharge Summary (Signed)
Physician Discharge Summary  Brenda Woods WCH:[id number] DOB: [birth year] DOA: 11/05/2013  PCP: Rachell Cipro, MD  Admit date: 11/05/2013 Discharge date: 11/07/2013  Time spent: > 35 minutes  Recommendations for Outpatient Follow-up:  1. Recommended avoiding swimming in lakes, baths, or driving. Patient verbalizes agreement. 2. She will need to f/u with Neurologist  Discharge Diagnoses:  Active Problems:   Generalized convulsive epilepsy without mention of intractable epilepsy   Seizure   Lung field abnormal finding on examination   Dehydration   Discharge Condition: stable  Diet recommendation:  Low sodium/heart healthy diet. Filed Weights   11/06/13 0236  Weight: 72 kg (158 lb 11.7 oz)    History of present illness:  Brenda Woods is a 58 y.o. female  has a past medical history of Allergy; Arthritis; and Seizure.  That presented to the ED after developing a seizure  Hospital Course:  Seizure d/o - CT scan of head reported no acute intracranial abnormalities - No more seizure activity after recommended change by neurologist in anti seizure medication - Will discharge on increased dose of carbamazepine - Please see d/c instructions below regarding recommended limitations.  Procedures:  As listed below  Consultations:  The case was initially discussed with neurohospitalist per EMR reports  Discharge Exam: Filed Vitals:   11/07/13 1006  BP: 133/69  Pulse: 76  Temp: 98.4 F (36.9 C)  Resp: 16    General: Pt in NAD, alert and awake Cardiovascular: RRR, no MrG Respiratory: CTA BL, no wheezes  Discharge Instructions You were cared for by a hospitalist during your hospital stay. If you have any questions about your discharge medications or the care you received while you were in the hospital after you are discharged, you can call the unit and asked to speak with the hospitalist on call if the hospitalist that took care of you is not available. Once you are  discharged, your primary care physician will handle any further medical issues. Please note that NO REFILLS for any discharge medications will be authorized once you are discharged, as it is imperative that you return to your primary care physician (or establish a relationship with a primary care physician if you do not have one) for your aftercare needs so that they can reassess your need for medications and monitor your lab values.  Discharge Orders   Future Appointments Provider Department Dept Phone   07/14/2014 8:30 AM Dennie Bible, NP [facility] [phone]   Future Orders Complete By Expires   Call MD for:  difficulty breathing, headache or visual disturbances  As directed    Call MD for:  extreme fatigue  As directed    Call MD for:  temperature >100.4  As directed    Diet - low sodium heart healthy  As directed    Discharge instructions  As directed    Increase activity slowly  As directed        Medication List         calcium citrate-vitamin D 315-200 MG-UNIT per tablet  Commonly known as:  CITRACAL+D  Take 1 tablet by mouth 2 (two) times daily.     carbamazepine 100 MG 12 hr tablet  Commonly known as:  TEGRETOL XR  Take 5 tablets (500 mg total) by mouth 2 (two) times daily.     GLUCOSAMINE PO  Take by mouth. Taking 3 chews daily.     loratadine 10 MG tablet  Commonly known as:  CLARITIN  Take 10 mg by mouth  daily.     PHENobarbital 64.8 MG tablet  Commonly known as:  LUMINAL  Take 2 tablets (129.6 mg total) by mouth at bedtime.       No Known Allergies    The results of significant diagnostics from this hospitalization (including imaging, microbiology, ancillary and laboratory) are listed below for reference.    Significant Diagnostic Studies: X-ray Chest Pa And Lateral   11/06/2013   CLINICAL DATA:  Weakness.  Seizure.  EXAM: CHEST  2 VIEW  COMPARISON:  None.  FINDINGS: The heart size and mediastinal contours are within normal  limits. Both lungs are clear. The bony thorax is intact.  IMPRESSION: No active cardiopulmonary disease.   Electronically Signed   By: Lajean Manes M.D.   On: 11/06/2013 12:55   Ct Head Wo Contrast  11/06/2013   CLINICAL DATA:  Seizures.  EXAM: CT HEAD WITHOUT CONTRAST  TECHNIQUE: Contiguous axial images were obtained from the base of the skull through the vertex without intravenous contrast.  COMPARISON:  None.  FINDINGS: Some images limited due to motion artifact. Ventricles and sulci appear symmetrical. No ventricular dilatation. Patchy low-attenuation changes in the deep white matter consistent with small vessel ischemia. No mass effect or midline shift. No abnormal extra-axial fluid collections. Gray-white matter junctions are distinct. Basal cisterns are not effaced. No evidence of acute intracranial hemorrhage. No depressed skull fractures. Visualized paranasal sinuses and mastoid air cells are not opacified.  IMPRESSION: No acute intracranial abnormalities. Mild chronic small vessel ischemic changes suggested.   Electronically Signed   By: Lucienne Capers M.D.   On: 11/06/2013 04:55    Microbiology: Recent Results (from the past 240 hour(s))  URINE CULTURE     Status: None   Collection Time    11/06/13  3:06 AM      Result Value Ref Range Status   Specimen Description URINE, RANDOM   Final   Special Requests NONE   Final   Culture  Setup Time     Final   Value: 11/06/2013 13:32     Performed at [facility]     Final   Value: 9,000 COLONIES/ML     Performed at [facility]   Culture     Final   Value: INSIGNIFICANT GROWTH     Performed at [facility]   Report Status 11/07/2013 FINAL   Final     Labs: Basic Metabolic Panel:  Recent Labs Lab 11/06/13 0011 11/06/13 0018 11/06/13 0520  NA  --  138 139  K  --  4.0 3.8  CL  --  101 104  CO2  --   --  24  GLUCOSE  --  110* 92  BUN  --  14 15  CREATININE  --  0.60 0.58  CALCIUM  --    --  8.4  MG 2.1  --   --   PHOS  --   --  3.8   Liver Function Tests:  Recent Labs Lab 11/06/13 0520  AST 17  ALT 13  ALKPHOS 93  BILITOT <0.2*  PROT 6.1  ALBUMIN 3.0*   No results found for this basename: LIPASE, AMYLASE,  in the last 168 hours No results found for this basename: AMMONIA,  in the last 168 hours CBC:  Recent Labs Lab 11/06/13 0011 11/06/13 0018 11/06/13 0520  WBC 6.8  --  7.3  NEUTROABS 4.4  --   --   HGB 12.1 13.3 11.0*  HCT 35.2* 39.0 32.0*  MCV 92.1  --  92.2  PLT 287  --  258   Cardiac Enzymes: No results found for this basename: CKTOTAL, CKMB, CKMBINDEX, TROPONINI,  in the last 168 hours BNP: BNP (last 3 results) No results found for this basename: PROBNP,  in the last 8760 hours CBG:  Recent Labs Lab 11/05/13 2010 11/05/13 2125  GLUCAP 124* 121*       Signed:  Velvet Bathe  [facility] 11/07/2013, 12:12 PM

## 2013-11-07 NOTE — Telephone Encounter (Signed)
Updated Rx has been sent to mail order per patient request.

## 2013-11-16 ENCOUNTER — Ambulatory Visit: Payer: BC Managed Care – PPO | Admitting: Nurse Practitioner

## 2013-12-26 ENCOUNTER — Other Ambulatory Visit: Payer: Self-pay | Admitting: Nurse Practitioner

## 2013-12-26 MED ORDER — CARBAMAZEPINE ER 100 MG PO TB12: 500.0000 mg | ORAL_TABLET | Freq: Two times a day (BID) | ORAL | Status: DC

## 2013-12-26 MED ORDER — PHENOBARBITAL 64.8 MG PO TABS: 129.6000 mg | ORAL_TABLET | Freq: Every day | ORAL | Status: DC

## 2013-12-26 NOTE — Telephone Encounter (Signed)
Rx signed and faxed.

## 2013-12-26 NOTE — Telephone Encounter (Signed)
Patient's leaving for [place] on 16th of July and returning July 31st.  Requesting refill for  PHENobarbital (LUMINAL) 64.8 MG tablet and wanting to know if carbamazepine (TEGRETOL XR) 100 MG 12 hr tablet that she's taking presently would last [place].  Also wanted to know if she can take Vivarin, to help keep her awake during layover's at airport for 24 hours.  Please call and advise.

## 2013-12-30 ENCOUNTER — Telehealth: Payer: Self-pay | Admitting: Nurse Practitioner

## 2013-12-30 NOTE — Telephone Encounter (Signed)
Patient is going on a trip to [place]--the trip will be 18 hours--patient wants to know if she can take a caffeine pill to keep her awake during the trip--please advise patient--thank you.

## 2014-01-02 NOTE — Telephone Encounter (Signed)
Called pt to inform her per Hoyle Sauer, NP for the pt to read the label on the bottle to see if it is contraindicated with tegretol or ask the pharmacist. I advised the pt the if she has any other problems, questions or concerns to call the office. Pt verbalized understanding.

## 2014-01-02 NOTE — Telephone Encounter (Signed)
Pt is calling stating that she is going on a trip to [place] and wanted to know if it would be ok to take a caffeine pill to stay awake for the 18 hour trip. Pt takes tegretol 100 mg 5 tabs twice daily and phenobarb 64.8 mg 2 tab at bedtime, next OV is 02/20/14. Please advise

## 2014-01-02 NOTE — Telephone Encounter (Signed)
Need to read the label on the bottle to see if it is contraindicated with Tegretol or ask the pharmacist.

## 2014-02-20 ENCOUNTER — Encounter: Payer: Self-pay | Admitting: Nurse Practitioner

## 2014-02-20 ENCOUNTER — Ambulatory Visit (INDEPENDENT_AMBULATORY_CARE_PROVIDER_SITE_OTHER): Payer: BC Managed Care – PPO | Admitting: Nurse Practitioner

## 2014-02-20 VITALS — BP 149/76 | HR 69 | Ht 64.5 in | Wt 153.4 lb

## 2014-02-20 DIAGNOSIS — Z5181 Encounter for therapeutic drug level monitoring: Secondary | ICD-10-CM

## 2014-02-20 DIAGNOSIS — G40209 Localization-related (focal) (partial) symptomatic epilepsy and epileptic syndromes with complex partial seizures, not intractable, without status epilepticus: Secondary | ICD-10-CM

## 2014-02-20 DIAGNOSIS — G40309 Generalized idiopathic epilepsy and epileptic syndromes, not intractable, without status epilepticus: Secondary | ICD-10-CM

## 2014-02-20 NOTE — Patient Instructions (Signed)
Will check levels of drug today Reorder medicines after levels are back Followup in 6 months

## 2014-02-20 NOTE — Progress Notes (Signed)
[facility]  PATIENT: Brenda Woods DOB: [birth year]   REASON FOR VISIT: follow up for seizure disorder   HISTORY OF PRESENT ILLNESS:Brenda Woods, 58 year old female returns for followup,  she has a history of seizure disorder with last seizure occurring in May 2015 and then previously in 1990. She claims she was in the grocery store and was reaching for something when she started having jerking of her extremities. 911 was called. She has no recollection of the events. According to the hospital record she was dehydrated. Her Tegretol dose was increased to 500 mg twice daily. She is also on phenobarbital. She had recent labs at her ER visits  however she has not had levels of her drugs checked. She needs refills on her medications. She is here to followup. She has not had further seizure events.  HISTORY: She had a history of seizure disorder that is well controlled on Tegretol and Phenobarbital. Her last seizure occurred in 1990. Denies side effects to her medication. She had been followed at [facility] since 1968 with onset of seizures at age 66. She has had multiple trials of medications. She has no other significant medical problems.    REVIEW OF SYSTEMS: Full 14 system review of systems performed and notable only for those listed, all others are neg:  Constitutional: N/A  Cardiovascular: N/A  Ear/Nose/Throat: N/A  Skin: N/A  Eyes: N/A  Respiratory: N/A  Gastroitestinal: N/A  Hematology/Lymphatic: N/A  Endocrine: N/A Musculoskeletal:N/A  Allergy/Immunology: N/A  Neurological: seizure Psychiatric: N/A Sleep : NA   ALLERGIES: No Known Allergies  HOME MEDICATIONS: Outpatient Prescriptions Prior to Visit  Medication Sig Dispense Refill  . calcium citrate-vitamin D (CITRACAL+D) 315-200 MG-UNIT per tablet Take 1 tablet by mouth 2 (two) times daily.      . carbamazepine (TEGRETOL XR) 100 MG 12 hr tablet Take 5 tablets (500 mg total) by mouth 2 (two) times daily.  900 tablet   1  . Glucosamine HCl (GLUCOSAMINE PO) Take by mouth. Taking 3 chews daily.      Marland Kitchen loratadine (CLARITIN) 10 MG tablet Take 10 mg by mouth daily.      Marland Kitchen PHENobarbital (LUMINAL) 64.8 MG tablet Take 2 tablets (129.6 mg total) by mouth at bedtime.  180 tablet  1   No facility-administered medications prior to visit.    PAST MEDICAL HISTORY: Past Medical History  Diagnosis Date  . Allergy   . Arthritis     elbows  . Seizure     last seizure 1991    PAST SURGICAL HISTORY: Past Surgical History  Procedure Laterality Date  . Ankle fracture surgery  07-05-08    FAMILY HISTORY: Family History  Problem Relation Age of Onset  . Breast cancer Maternal Aunt   . Colon cancer Neg Hx   . Esophageal cancer Neg Hx   . Rectal cancer Neg Hx   . Stomach cancer Neg Hx     SOCIAL HISTORY: History   Social History  . Marital Status: Married    Spouse Name: Legrand Como    Number of Children: 0  . Years of Education: 12   Occupational History  .  [employer]   Social History Main Topics  . Smoking status: Never Smoker   . Smokeless tobacco: Never Used  . Alcohol Use: No  . Drug Use: No  . Sexual Activity: Not on file   Other Topics Concern  . Not on file   Social History Narrative   Patient is married  to Legrand Como.    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    Patient has a high school education.   Patient is right-handed.   Patient drinks about 2 glasses of soda daily.     PHYSICAL EXAM  Filed Vitals:   02/20/14 0853  BP: 149/76  Pulse: 69  Height: 5' 4.5" (1.638 m)  Weight: 153 lb 6.4 oz (69.582 kg)   Body mass index is 25.93 kg/(m^2). Generalized: Well developed, in no acute distress  Neurological examination  Mentation: Alert oriented to time, place, history taking. Follows all commands speech and language fluent  Cranial nerve II-XII: Pupils were equal round reactive to light extraocular  movements were full, visual field were full on confrontational test. Facial sensation and strength were normal. hearing was intact to finger rubbing bilaterally. Uvula tongue midline. head turning and shoulder shrug were normal and symmetric.Tongue protrusion into cheek strength was normal.  Motor: normal bulk and tone, full strength in the BUE, BLE, fine finger movements normal, no pronator drift. No focal weakness  Coordination: finger-nose-finger, heel-to-shin bilaterally, no dysmetria  Reflexes: 1+ upper lower and symmetric  Gait and Station: Rising up from seated position without assistance, normal stance, moderate stride, good arm swing, smooth turning, able to perform tiptoe, and heel walking without difficulty. Tandem gait is steady     DIAGNOSTIC DATA (LABS, IMAGING, TESTING) - I reviewed patient records, labs, notes, testing and imaging myself where available.  Lab Results  Component Value Date   WBC 7.3 11/06/2013   HGB 11.0* 11/06/2013   HCT 32.0* 11/06/2013   MCV 92.2 11/06/2013   PLT 258 11/06/2013      Component Value Date/Time   NA 139 11/06/2013 0520   K 3.8 11/06/2013 0520   CL 104 11/06/2013 0520   CO2 24 11/06/2013 0520   GLUCOSE 92 11/06/2013 0520   BUN 15 11/06/2013 0520   CREATININE 0.58 11/06/2013 0520   CALCIUM 8.4 11/06/2013 0520   PROT 6.1 11/06/2013 0520   ALBUMIN 3.0* 11/06/2013 0520   AST 17 11/06/2013 0520   ALT 13 11/06/2013 0520   ALKPHOS 93 11/06/2013 0520   BILITOT <0.2* 11/06/2013 0520   GFRNONAA >90 11/06/2013 0520   GFRAA >90 11/06/2013 0520    Lab Results  Component Value Date   TSH 2.000 11/06/2013      ASSESSMENT AND PLAN  58 y.o. year old female  has a past medical history of Allergy; Arthritis; and Seizure. here to followup. Recent seizure 2015 with visit to the emergency room. Tegretol was increased to 500 mg twice daily. She remains on phenobarbital. Hospital records reviewed.  Will check levels of drug today Reorder medicines after levels are back Call for  further seizure activity Followup in 6 months Dennie Bible, [facility], [facility], [facility] [address], [place], [place] [address] [phone]

## 2014-02-21 ENCOUNTER — Telehealth: Payer: Self-pay | Admitting: Nurse Practitioner

## 2014-02-21 LAB — PHENOBARBITAL LEVEL: PHENOBARBITAL LVL: 23 ug/mL (ref 15–40)

## 2014-02-21 LAB — CARBAMAZEPINE LEVEL, TOTAL: Carbamazepine Lvl: 4.8 ug/mL (ref 4.0–12.0)

## 2014-02-21 MED ORDER — PHENOBARBITAL 64.8 MG PO TABS: 129.6000 mg | ORAL_TABLET | Freq: Every day | ORAL | Status: DC

## 2014-02-21 MED ORDER — CARBAMAZEPINE ER 100 MG PO TB12: 500.0000 mg | ORAL_TABLET | Freq: Two times a day (BID) | ORAL | Status: DC

## 2014-02-21 NOTE — Telephone Encounter (Signed)
See result note, meds reordered

## 2014-02-23 NOTE — Progress Notes (Signed)
I reviewed note and agree with plan.   Alisabeth Selkirk R. Annissa Andreoni, MD  Certified in Neurology, Neurophysiology and Neuroimaging  [facility] [address], [address] [place], [place] [address] [phone]   

## 2014-02-28 ENCOUNTER — Other Ambulatory Visit: Payer: Self-pay

## 2014-02-28 DIAGNOSIS — Z1231 Encounter for screening mammogram for malignant neoplasm of breast: Secondary | ICD-10-CM

## 2014-03-20 ENCOUNTER — Ambulatory Visit
Admission: RE | Admit: 2014-03-20 | Discharge: 2014-03-20 | Disposition: A | Discharge status: Home / Self Care | Payer: BC Managed Care – PPO | Source: Ambulatory Visit

## 2014-03-20 DIAGNOSIS — Z1231 Encounter for screening mammogram for malignant neoplasm of breast: Secondary | ICD-10-CM

## 2014-03-21 ENCOUNTER — Other Ambulatory Visit: Payer: Self-pay | Admitting: Obstetrics

## 2014-03-21 DIAGNOSIS — Z1231 Encounter for screening mammogram for malignant neoplasm of breast: Secondary | ICD-10-CM

## 2014-03-24 ENCOUNTER — Ambulatory Visit
Admission: RE | Admit: 2014-03-24 | Discharge: 2014-03-24 | Disposition: A | Discharge status: Home / Self Care | Payer: BC Managed Care – PPO | Source: Ambulatory Visit | Attending: Obstetrics | Admitting: Obstetrics

## 2014-03-24 DIAGNOSIS — Z1231 Encounter for screening mammogram for malignant neoplasm of breast: Secondary | ICD-10-CM

## 2014-03-28 ENCOUNTER — Ambulatory Visit: Payer: BC Managed Care – PPO

## 2014-07-14 ENCOUNTER — Ambulatory Visit: Payer: BC Managed Care – PPO | Admitting: Nurse Practitioner

## 2014-07-20 ENCOUNTER — Ambulatory Visit (INDEPENDENT_AMBULATORY_CARE_PROVIDER_SITE_OTHER): Payer: BLUE CROSS/BLUE SHIELD | Admitting: Nurse Practitioner

## 2014-07-20 ENCOUNTER — Encounter: Payer: Self-pay | Admitting: Nurse Practitioner

## 2014-07-20 ENCOUNTER — Telehealth: Payer: Self-pay | Admitting: *Deleted

## 2014-07-20 VITALS — BP 150/81 | HR 60 | Ht 64.0 in | Wt 146.2 lb

## 2014-07-20 DIAGNOSIS — Z5181 Encounter for therapeutic drug level monitoring: Secondary | ICD-10-CM

## 2014-07-20 DIAGNOSIS — G40209 Localization-related (focal) (partial) symptomatic epilepsy and epileptic syndromes with complex partial seizures, not intractable, without status epilepticus: Secondary | ICD-10-CM

## 2014-07-20 DIAGNOSIS — G40309 Generalized idiopathic epilepsy and epileptic syndromes, not intractable, without status epilepticus: Secondary | ICD-10-CM

## 2014-07-20 NOTE — Telephone Encounter (Signed)
Form on [place] desk.

## 2014-07-20 NOTE — Progress Notes (Signed)
[facility]  PATIENT: Brenda Woods DOB: [birth year]   REASON FOR VISIT: Follow-up for history of seizure disorder   HISTORY OF PRESENT ILLNESS:Brenda Woods, 59 year old female returns for followup, she has a history of seizure disorder with last seizure occurring in May 2015 and then previously in 1990. She was last seen in this office 02/20/2014. Her last seizure occurred  in the grocery store and was reaching for something when she started having jerking of her extremities. 911 was called. She has no recollection of the events. According to the hospital record she was dehydrated. Her Tegretol dose was increased to 500 mg twice daily. She is also on phenobarbital. She had recent labs at her ER visits however no levels of her drugs checked. Carbamazepine level on 03/29/2014 was 4.8. Phenobarbital level was 23. Both of these were therapeutic. On return visit today she has not had recurrent seizure activity, she needs refills on her medications, she needs her DMV form filled out. No new no new neurologic complaints   HISTORY: She had a history of seizure disorder that is well controlled on Tegretol and Phenobarbital. Her last seizure occurred in 1990. Denies side effects to her medication. She had been followed at [facility] since 1968 with onset of seizures at age 50. She has had multiple trials of medications. She has no other significant medical problems.   REVIEW OF SYSTEMS: Full 14 system review of systems performed and notable only for those listed, all others are neg:  Constitutional: N/A  Cardiovascular: N/A  Ear/Nose/Throat: N/A  Skin: N/A  Eyes: N/A  Respiratory: N/A  Gastroitestinal: N/A  Hematology/Lymphatic: N/A  Endocrine: N/A Musculoskeletal:N/A  Allergy/Immunology: N/A  Neurological: N/A Psychiatric: N/A Sleep : NA   ALLERGIES: No Known Allergies  HOME MEDICATIONS: Outpatient Prescriptions Prior to Visit  Medication Sig Dispense Refill  . calcium  citrate-vitamin D (CITRACAL+D) 315-200 MG-UNIT per tablet Take 1 tablet by mouth 2 (two) times daily.    . carbamazepine (TEGRETOL XR) 100 MG 12 hr tablet Take 5 tablets (500 mg total) by mouth 2 (two) times daily. 900 tablet 1  . Glucosamine HCl (GLUCOSAMINE PO) Take by mouth. Taking 3 chews daily.    Marland Kitchen loratadine (CLARITIN) 10 MG tablet Take 10 mg by mouth daily.    Marland Kitchen PHENobarbital (LUMINAL) 64.8 MG tablet Take 2 tablets (129.6 mg total) by mouth at bedtime. 180 tablet 1   No facility-administered medications prior to visit.    PAST MEDICAL HISTORY: Past Medical History  Diagnosis Date  . Allergy   . Arthritis     elbows  . Seizure     last seizure 1991    PAST SURGICAL HISTORY: Past Surgical History  Procedure Laterality Date  . Ankle fracture surgery  07-05-08    FAMILY HISTORY: Family History  Problem Relation Age of Onset  . Breast cancer Maternal Aunt   . Colon cancer Neg Hx   . Esophageal cancer Neg Hx   . Rectal cancer Neg Hx   . Stomach cancer Neg Hx     SOCIAL HISTORY: History   Social History  . Marital Status: Married    Spouse Name: Brenda Woods    Number of Children: 0  . Years of Education: 12   Occupational History  .  [employer]   Social History Main Topics  . Smoking status: Never Smoker   . Smokeless tobacco: Never Used  . Alcohol Use: No  . Drug Use: No  . Sexual Activity: Not  on file   Other Topics Concern  . Not on file   Social History Narrative   Patient is married to Brenda Woods.    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    Patient has a high school education.   Patient is right-handed.   Patient drinks about 2 glasses of soda daily.     PHYSICAL EXAM  Filed Vitals:   07/20/14 0852  BP: 150/81  Pulse: 60  Height: 5\' 4"  (1.626 m)  Weight: 146 lb 3.2 oz (66.316 kg)   Body mass index is 25.08 kg/(m^2). Generalized: Well developed, in no acute distress   Neurological examination  Mentation: Alert oriented to time, place, history taking. Follows all commands speech and language fluent  Cranial nerve II-XII: Pupils were equal round reactive to light extraocular movements were full, visual field were full on confrontational test. Facial sensation and strength were normal. hearing was intact to finger rubbing bilaterally. Uvula tongue midline. head turning and shoulder shrug were normal and symmetric.Tongue protrusion into cheek strength was normal.  Motor: normal bulk and tone, full strength in the BUE, BLE, fine finger movements normal, no pronator drift. No focal weakness  Coordination: finger-nose-finger, heel-to-shin bilaterally, no dysmetria  Reflexes: 1+ upper lower and symmetric  Gait and Station: Rising up from seated position without assistance, normal stance, moderate stride, good arm swing, smooth turning, able to perform tiptoe, and heel walking without difficulty. Tandem gait is steady .No assistive device     DIAGNOSTIC DATA (LABS, IMAGING, TESTING) - I reviewed patient records, labs, notes, testing and imaging myself where available.  Lab Results  Component Value Date   WBC 7.3 11/06/2013   HGB 11.0* 11/06/2013   HCT 32.0* 11/06/2013   MCV 92.2 11/06/2013   PLT 258 11/06/2013      Component Value Date/Time   NA 139 11/06/2013 0520   K 3.8 11/06/2013 0520   CL 104 11/06/2013 0520   CO2 24 11/06/2013 0520   GLUCOSE 92 11/06/2013 0520   BUN 15 11/06/2013 0520   CREATININE 0.58 11/06/2013 0520   CALCIUM 8.4 11/06/2013 0520   PROT 6.1 11/06/2013 0520   ALBUMIN 3.0* 11/06/2013 0520   AST 17 11/06/2013 0520   ALT 13 11/06/2013 0520   ALKPHOS 93 11/06/2013 0520   BILITOT <0.2* 11/06/2013 0520   GFRNONAA >90 11/06/2013 0520   GFRAA >90 11/06/2013 0520    Lab Results  Component Value Date   TSH 2.000 11/06/2013      ASSESSMENT AND PLAN  59 y.o. year old female  has a past medical history of  Seizure.  Disorder here to follow-up. Last seizure occurred in May 2015. Last carbamazepine level 4.8 , last phenobarbital level 23, both within the therapeutic range.   Continue Phenobarb and Tegretol at current dose.will refill once labs are back Will check labs today, CBC, CMP, CBZ level and Phenobarb level Call for seizure activity Will fill out DMV forms F/U yearly and prn Dennie Bible, [facility], [facility], APRN  [facility] [address] [place], [place] [address] [phone]

## 2014-07-20 NOTE — Patient Instructions (Signed)
Continue Phenobarb and Tegretol at current dose. Will check labs today, CBC, CMP, CBZ level and Phenobarb level Call for seizure activity Will fill out DMV forms F/U yearly and prn

## 2014-07-21 DIAGNOSIS — Z0289 Encounter for other administrative examinations: Secondary | ICD-10-CM

## 2014-07-21 LAB — COMPREHENSIVE METABOLIC PANEL
A/G RATIO: 1.6 (ref 1.1–2.5)
ALBUMIN: 4.5 g/dL (ref 3.6–4.8)
ALT: 14 IU/L (ref 0–32)
AST: 17 IU/L (ref 0–40)
Alkaline Phosphatase: 118 IU/L — ABNORMAL HIGH (ref 39–117)
BUN / CREAT RATIO: 14 (ref 11–26)
BUN: 8 mg/dL (ref 8–27)
CO2: 24 mmol/L (ref 18–29)
Calcium: 9.7 mg/dL (ref 8.7–10.3)
Chloride: 88 mmol/L — ABNORMAL LOW (ref 97–108)
Creatinine, Ser: 0.58 mg/dL (ref 0.57–1.00)
GFR calc Af Amer: 116 mL/min/{1.73_m2} (ref >59)
GFR calc non Af Amer: 101 mL/min/{1.73_m2} (ref >59)
Globulin, Total: 2.9 g/dL (ref 1.5–4.5)
Glucose: 85 mg/dL (ref 65–99)
Potassium: 4.5 mmol/L (ref 3.5–5.2)
Sodium: 127 mmol/L — ABNORMAL LOW (ref 134–144)
TOTAL PROTEIN: 7.4 g/dL (ref 6.0–8.5)
Total Bilirubin: 0.3 mg/dL (ref 0.0–1.2)

## 2014-07-21 LAB — CBC WITH DIFFERENTIAL/PLATELET
Basophils Absolute: 0 10*3/uL (ref 0.0–0.2)
Basos: 0 %
Eos: 3 %
Eosinophils Absolute: 0.1 10*3/uL (ref 0.0–0.4)
HCT: 36.8 % (ref 34.0–46.6)
Hemoglobin: 12.7 g/dL (ref 11.1–15.9)
IMMATURE GRANS (ABS): 0 10*3/uL (ref 0.0–0.1)
IMMATURE GRANULOCYTES: 0 %
LYMPHS: 40 %
Lymphocytes Absolute: 1.8 10*3/uL (ref 0.7–3.1)
MCH: 31.8 pg (ref 26.6–33.0)
MCHC: 34.5 g/dL (ref 31.5–35.7)
MCV: 92 fL (ref 79–97)
Monocytes Absolute: 0.5 10*3/uL (ref 0.1–0.9)
Monocytes: 10 %
Neutrophils Absolute: 2.1 10*3/uL (ref 1.4–7.0)
Neutrophils Relative %: 47 %
RBC: 4 x10E6/uL (ref 3.77–5.28)
RDW: 14.1 % (ref 12.3–15.4)
WBC: 4.6 10*3/uL (ref 3.4–10.8)

## 2014-07-21 LAB — CARBAMAZEPINE LEVEL, TOTAL: Carbamazepine Lvl: 6.9 ug/mL (ref 4.0–12.0)

## 2014-07-21 LAB — PHENOBARBITAL LEVEL: PHENOBARBITAL LVL: 23 ug/mL (ref 15–40)

## 2014-08-08 ENCOUNTER — Other Ambulatory Visit: Payer: Self-pay

## 2014-08-08 MED ORDER — PHENOBARBITAL 64.8 MG PO TABS: 129.6000 mg | ORAL_TABLET | Freq: Every day | ORAL | Status: DC

## 2014-08-09 ENCOUNTER — Telehealth: Payer: Self-pay | Admitting: Nurse Practitioner

## 2014-08-09 DIAGNOSIS — Z5181 Encounter for therapeutic drug level monitoring: Secondary | ICD-10-CM

## 2014-08-09 NOTE — Telephone Encounter (Signed)
Patient needs repeat BMP. Orders in the system. Please call the patient and have her come in , no appointment necessary

## 2014-08-09 NOTE — Telephone Encounter (Signed)
Called patient and told patient to come have BMP done orders in the system. Ready for draw.

## 2014-08-09 NOTE — Telephone Encounter (Signed)
Rx signed and faxed.

## 2014-08-15 ENCOUNTER — Other Ambulatory Visit (INDEPENDENT_AMBULATORY_CARE_PROVIDER_SITE_OTHER): Payer: Self-pay

## 2014-08-15 ENCOUNTER — Other Ambulatory Visit: Payer: Self-pay | Admitting: Nurse Practitioner

## 2014-08-15 DIAGNOSIS — Z0289 Encounter for other administrative examinations: Secondary | ICD-10-CM

## 2014-08-15 DIAGNOSIS — Z5181 Encounter for therapeutic drug level monitoring: Secondary | ICD-10-CM

## 2014-08-16 ENCOUNTER — Telehealth: Payer: Self-pay

## 2014-08-16 LAB — BASIC METABOLIC PANEL
BUN/Creatinine Ratio: 22 (ref 11–26)
BUN: 13 mg/dL (ref 8–27)
CHLORIDE: 92 mmol/L — AB (ref 97–108)
CO2: 25 mmol/L (ref 18–29)
Calcium: 9.5 mg/dL (ref 8.7–10.3)
Creatinine, Ser: 0.6 mg/dL (ref 0.57–1.00)
GFR calc non Af Amer: 99 mL/min/{1.73_m2} (ref >59)
GFR, EST AFRICAN AMERICAN: 115 mL/min/{1.73_m2} (ref >59)
Glucose: 84 mg/dL (ref 65–99)
Potassium: 4.6 mmol/L (ref 3.5–5.2)
SODIUM: 130 mmol/L — AB (ref 134–144)

## 2014-08-16 NOTE — Telephone Encounter (Signed)
-----   Message from Dennie Bible, NP sent at 08/16/2014  9:06 AM EST ----- Labs better. Please call patient

## 2014-08-16 NOTE — Telephone Encounter (Signed)
Called patient. No answer. Left vmail.  

## 2014-08-22 NOTE — Progress Notes (Signed)
I reviewed note and agree with plan.   Hyland Mollenkopf R. Tehani Mersman, MD  Certified in Neurology, Neurophysiology and Neuroimaging  [facility] [address], [address] [place], [place] [address] [phone]   

## 2014-08-25 ENCOUNTER — Ambulatory Visit: Payer: BC Managed Care – PPO | Admitting: Nurse Practitioner

## 2014-09-28 ENCOUNTER — Telehealth: Payer: Self-pay | Admitting: *Deleted

## 2014-09-28 NOTE — Telephone Encounter (Signed)
TC to patient. The Dayquil  has a small amount of alcohol. It should be ok.Do not drink any alcoholic drinks with the medication. She says she understands.

## 2014-09-28 NOTE — Telephone Encounter (Signed)
Patient calling stating that she been sick and when she went to the doctor they told her she can take Dayquil. The patient states that dayquil has alcohol in it and wants to make sure that this is ok to take. Please advise.

## 2014-10-09 ENCOUNTER — Telehealth: Payer: Self-pay | Admitting: Diagnostic Neuroimaging

## 2014-10-09 NOTE — Telephone Encounter (Signed)
Patient requesting referral to ENT having problems with ears.  Can hear herself chew and drink, been having problems for 2 weeks.  Please call and advise.

## 2014-10-10 NOTE — Telephone Encounter (Signed)
Left message asking the pt to call back to the office. If she does, please let her know that Dr. Leta Baptist stated that he is managing her seizure disorder and she should ask her PCP for an ENT referral. Thanks

## 2014-12-06 NOTE — Telephone Encounter (Signed)
Done

## 2015-02-20 ENCOUNTER — Other Ambulatory Visit: Payer: Self-pay

## 2015-02-20 DIAGNOSIS — Z1231 Encounter for screening mammogram for malignant neoplasm of breast: Secondary | ICD-10-CM

## 2015-03-26 ENCOUNTER — Ambulatory Visit: Payer: Self-pay

## 2015-04-02 ENCOUNTER — Ambulatory Visit
Admission: RE | Admit: 2015-04-02 | Discharge: 2015-04-02 | Disposition: A | Discharge status: Home / Self Care | Payer: BLUE CROSS/BLUE SHIELD | Source: Ambulatory Visit

## 2015-04-02 DIAGNOSIS — Z1231 Encounter for screening mammogram for malignant neoplasm of breast: Secondary | ICD-10-CM

## 2015-04-20 ENCOUNTER — Other Ambulatory Visit: Payer: Self-pay | Admitting: Diagnostic Neuroimaging

## 2015-04-24 ENCOUNTER — Other Ambulatory Visit: Payer: Self-pay

## 2015-04-24 MED ORDER — CARBAMAZEPINE ER 100 MG PO TB12: 500.0000 mg | ORAL_TABLET | Freq: Two times a day (BID) | ORAL | Status: DC

## 2015-04-24 MED ORDER — PHENOBARBITAL 64.8 MG PO TABS: ORAL_TABLET | ORAL | Status: DC

## 2015-04-24 NOTE — Telephone Encounter (Signed)
Rx signed and faxed.

## 2015-07-27 ENCOUNTER — Ambulatory Visit (INDEPENDENT_AMBULATORY_CARE_PROVIDER_SITE_OTHER): Payer: BLUE CROSS/BLUE SHIELD | Admitting: Nurse Practitioner

## 2015-07-27 ENCOUNTER — Encounter: Payer: Self-pay | Admitting: Nurse Practitioner

## 2015-07-27 VITALS — BP 148/73 | HR 65 | Ht 64.0 in | Wt 136.8 lb

## 2015-07-27 DIAGNOSIS — G40209 Localization-related (focal) (partial) symptomatic epilepsy and epileptic syndromes with complex partial seizures, not intractable, without status epilepticus: Secondary | ICD-10-CM

## 2015-07-27 DIAGNOSIS — G40309 Generalized idiopathic epilepsy and epileptic syndromes, not intractable, without status epilepticus: Secondary | ICD-10-CM | POA: Diagnosis not present

## 2015-07-27 DIAGNOSIS — Z5181 Encounter for therapeutic drug level monitoring: Secondary | ICD-10-CM

## 2015-07-27 NOTE — Progress Notes (Signed)
[facility]  PATIENT: PLUMER ERICKSEN DOB: 1956-01-27   REASON FOR VISIT: Follow-up for history of generalized seizure disorder HISTORY FROM: Patient    HISTORY OF PRESENT ILLNESS:Ms. Speers, 60 year old female returns for followup, she has a history of seizure disorder with last seizure occurring in May 2015 and then previously in 1990. She was last seen in this office 07/20/14. Her last seizure occurred in the grocery store and was reaching for something when she started having jerking of her extremities. 911 was called. She has no recollection of the events. According to the hospital record she was dehydrated. Her Tegretol dose was increased to 500 mg twice daily. She is also on phenobarbital.  On return visit today she has not had recurrent seizure activity, she needs refills on her medications and labs. No new no new neurologic complaints  HISTORY: She had a history of seizure disorder that is well controlled on Tegretol and Phenobarbital. Her last seizure occurred in 1990. Denies side effects to her medication. She had been followed at [facility] since 1968 with onset of seizures at age 2. She has had multiple trials of medications. She has no other significant medical problems.    REVIEW OF SYSTEMS: Full 14 system review of systems performed and notable only for those listed, all others are neg:  Constitutional: neg  Cardiovascular: neg Ear/Nose/Throat: neg  Skin: neg Eyes: neg Respiratory: neg Gastroitestinal: neg  Hematology/Lymphatic: neg  Endocrine: neg Musculoskeletal:neg Allergy/Immunology: neg Neurological: neg Psychiatric: neg Sleep : neg   ALLERGIES: No Known Allergies  HOME MEDICATIONS: Outpatient Prescriptions Prior to Visit  Medication Sig Dispense Refill  . calcium citrate-vitamin D (CITRACAL+D) 315-200 MG-UNIT per tablet Take 1 tablet by mouth 2 (two) times daily.    . carbamazepine (TEGRETOL XR) 100 MG 12 hr tablet Take 5 tablets (500 mg  total) by mouth 2 (two) times daily. 900 tablet 1  . PHENobarbital (LUMINAL) 64.8 MG tablet TAKE 2 BY MOUTH AT BEDTIME 180 tablet 1  . Glucosamine HCl (GLUCOSAMINE PO) Take by mouth. Reported on 07/27/2015    . loratadine (CLARITIN) 10 MG tablet Take 10 mg by mouth daily. Reported on 07/27/2015     No facility-administered medications prior to visit.    PAST MEDICAL HISTORY: Past Medical History  Diagnosis Date  . Allergy   . Arthritis     elbows  . Seizure ([place])     last seizure 1991    PAST SURGICAL HISTORY: Past Surgical History  Procedure Laterality Date  . Ankle fracture surgery  07-05-08    FAMILY HISTORY: Family History  Problem Relation Age of Onset  . Breast cancer Maternal Aunt   . Colon cancer Neg Hx   . Esophageal cancer Neg Hx   . Rectal cancer Neg Hx   . Stomach cancer Neg Hx     SOCIAL HISTORY: Social History   Social History  . Marital Status: Married    Spouse Name: Legrand Como  . Number of Children: 0  . Years of Education: 12   Occupational History  .  [employer]   Social History Main Topics  . Smoking status: Never Smoker   . Smokeless tobacco: Never Used  . Alcohol Use: No  . Drug Use: No  . Sexual Activity: Not on file   Other Topics Concern  . Not on file   Social History Narrative   Patient is married to [place].    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    Patient has a high school education.   Patient is right-handed.   Patient drinks about 2 glasses of soda daily.     PHYSICAL EXAM  Filed Vitals:   07/27/15 0828 07/27/15 0835  BP:  148/73  Pulse:  65  Height: 5\' 4"  (1.626 m) 5\' 4"  (1.626 m)  Weight: 136 lb 12.8 oz (62.052 kg) 136 lb 12.8 oz (62.052 kg)   Body mass index is 23.47 kg/(m^2). Generalized: Well developed, in no acute distress  Neurological examination  Mentation: Alert oriented to time, place, history taking. Follows all commands  speech and language fluent  Cranial nerve II-XII: Pupils were equal round reactive to light extraocular movements were full, visual field were full on confrontational test. Facial sensation and strength were normal. hearing was intact to finger rubbing bilaterally. Uvula tongue midline. head turning and shoulder shrug were normal and symmetric.Tongue protrusion into cheek strength was normal.  Motor: normal bulk and tone, full strength in the BUE, BLE, fine finger movements normal, no pronator drift. No focal weakness  Coordination: finger-nose-finger, heel-to-shin bilaterally, no dysmetria  Reflexes: 1+ upper lower and symmetric  Gait and Station: Rising up from seated position without assistance, normal stance, moderate stride, good arm swing, smooth turning, able to perform tiptoe, and heel walking without difficulty. Tandem gait is steady .No assistive device    DIAGNOSTIC DATA (LABS, IMAGING, TESTING) -  ASSESSMENT AND PLAN 60 y.o. year old female has a past medical history of Seizure Disorder here to follow-up. Last seizure occurred in May 2015.    Continue Phenobarb and Tegretol at current dose.will refill once labs are back Will check labs today, CBC, CMP, CBZ level and Phenobarb level Call for seizure activity F/U yearly and prn Dennie Bible, [facility], [facility], APRN  [facility] [address] [place], [place] [address] [phone]

## 2015-07-27 NOTE — Patient Instructions (Signed)
Continue Phenobarb and Tegretol at current dose.will refill once labs are back Will check labs today, CBC, CMP, CBZ level and Phenobarb level Call for seizure activity F/U yearly and prn

## 2015-07-28 LAB — COMPREHENSIVE METABOLIC PANEL
ALT: 18 IU/L (ref 0–32)
AST: 25 IU/L (ref 0–40)
Albumin/Globulin Ratio: 1.7 (ref 1.1–2.5)
Albumin: 4.2 g/dL (ref 3.6–4.8)
Alkaline Phosphatase: 118 IU/L — ABNORMAL HIGH (ref 39–117)
BUN/Creatinine Ratio: 24 (ref 11–26)
BUN: 11 mg/dL (ref 8–27)
CHLORIDE: 88 mmol/L — AB (ref 96–106)
CO2: 26 mmol/L (ref 18–29)
Calcium: 9.2 mg/dL (ref 8.7–10.3)
Creatinine, Ser: 0.46 mg/dL — ABNORMAL LOW (ref 0.57–1.00)
GFR calc Af Amer: 124 mL/min/{1.73_m2} (ref >59)
GFR, EST NON AFRICAN AMERICAN: 108 mL/min/{1.73_m2} (ref >59)
GLOBULIN, TOTAL: 2.5 g/dL (ref 1.5–4.5)
Glucose: 88 mg/dL (ref 65–99)
POTASSIUM: 5.3 mmol/L — AB (ref 3.5–5.2)
Sodium: 126 mmol/L — ABNORMAL LOW (ref 134–144)
Total Protein: 6.7 g/dL (ref 6.0–8.5)

## 2015-07-28 LAB — CBC WITH DIFFERENTIAL/PLATELET
BASOS: 1 %
Basophils Absolute: 0 10*3/uL (ref 0.0–0.2)
EOS (ABSOLUTE): 0.1 10*3/uL (ref 0.0–0.4)
Eos: 2 %
HEMATOCRIT: 34.6 % (ref 34.0–46.6)
Hemoglobin: 11.8 g/dL (ref 11.1–15.9)
IMMATURE GRANULOCYTES: 1 %
Immature Grans (Abs): 0 10*3/uL (ref 0.0–0.1)
Lymphocytes Absolute: 1.6 10*3/uL (ref 0.7–3.1)
Lymphs: 36 %
MCH: 31 pg (ref 26.6–33.0)
MCHC: 34.1 g/dL (ref 31.5–35.7)
MCV: 91 fL (ref 79–97)
MONOS ABS: 0.7 10*3/uL (ref 0.1–0.9)
Monocytes: 16 %
NEUTROS PCT: 44 %
Neutrophils Absolute: 2 10*3/uL (ref 1.4–7.0)
Platelets: 294 10*3/uL (ref 150–379)
RBC: 3.81 x10E6/uL (ref 3.77–5.28)
RDW: 13.9 % (ref 12.3–15.4)
WBC: 4.4 10*3/uL (ref 3.4–10.8)

## 2015-07-28 LAB — PHENOBARBITAL LEVEL: PHENOBARBITAL, SERUM: 24 ug/mL (ref 15–40)

## 2015-07-28 LAB — CARBAMAZEPINE LEVEL, TOTAL: CARBAMAZEPINE LVL: 7.4 ug/mL (ref 4.0–12.0)

## 2015-07-30 ENCOUNTER — Telehealth: Payer: Self-pay | Admitting: *Deleted

## 2015-07-30 NOTE — Telephone Encounter (Signed)
Attempted to reach patient on home phone; no answer, unable to leave message. Reached patient on her mobile phone and informed her , per Daun Peacock NP, her labs look good except for low sodium. Advised her C Hassell Done will repeat lab in one month. She stated "I have been trying to eat better." Gave pt lab hours of operation and instructions for coming in. She verbalized understanding, appreciation.

## 2015-08-29 ENCOUNTER — Telehealth: Payer: Self-pay | Admitting: Nurse Practitioner

## 2015-08-29 DIAGNOSIS — Z5181 Encounter for therapeutic drug level monitoring: Secondary | ICD-10-CM

## 2015-08-29 NOTE — Telephone Encounter (Signed)
Please call patient and  remind her to get repeat BMP done due to sodium level. Order in the system.

## 2015-08-29 NOTE — Telephone Encounter (Signed)
I called and LMVM that I was calling to remind her about getting her 1 month lab recheck (low na).  Gave her hours and if she has questions to call us back.

## 2015-09-04 ENCOUNTER — Other Ambulatory Visit: Payer: Self-pay | Admitting: *Deleted

## 2015-09-04 ENCOUNTER — Other Ambulatory Visit (INDEPENDENT_AMBULATORY_CARE_PROVIDER_SITE_OTHER): Payer: Self-pay

## 2015-09-04 DIAGNOSIS — Z0289 Encounter for other administrative examinations: Secondary | ICD-10-CM

## 2015-09-04 DIAGNOSIS — G40309 Generalized idiopathic epilepsy and epileptic syndromes, not intractable, without status epilepticus: Secondary | ICD-10-CM

## 2015-09-04 NOTE — Progress Notes (Signed)
[facility] unable to release order.  Redid under order entry, future.

## 2015-09-04 NOTE — Addendum Note (Signed)
Addended byOliver Hum on: 09/04/2015 08:31 AM   Modules accepted: Orders

## 2015-09-05 LAB — BASIC METABOLIC PANEL
BUN/Creatinine Ratio: 19 (ref 11–26)
BUN: 9 mg/dL (ref 8–27)
CALCIUM: 9.3 mg/dL (ref 8.7–10.3)
CO2: 27 mmol/L (ref 18–29)
CREATININE: 0.47 mg/dL — AB (ref 0.57–1.00)
Chloride: 90 mmol/L — ABNORMAL LOW (ref 96–106)
GFR calc Af Amer: 123 mL/min/{1.73_m2} (ref >59)
GFR, EST NON AFRICAN AMERICAN: 107 mL/min/{1.73_m2} (ref >59)
GLUCOSE: 76 mg/dL (ref 65–99)
POTASSIUM: 4.6 mmol/L (ref 3.5–5.2)
Sodium: 128 mmol/L — ABNORMAL LOW (ref 134–144)

## 2015-09-06 ENCOUNTER — Telehealth: Payer: Self-pay | Admitting: Nurse Practitioner

## 2015-09-06 NOTE — Progress Notes (Signed)
Quick Note:  Called and spoke relayed labs were stable . Patient understood. ______

## 2015-09-06 NOTE — Telephone Encounter (Signed)
-----   Message from Dennie Bible, NP sent at 09/05/2015  2:54 PM EST ----- Labs stable please call the patient

## 2015-09-06 NOTE — Telephone Encounter (Signed)
Called and spoke to patient relayed labs were stable. Patient understood.  °

## 2015-11-05 ENCOUNTER — Other Ambulatory Visit: Payer: Self-pay | Admitting: Nurse Practitioner

## 2015-11-06 ENCOUNTER — Telehealth: Payer: Self-pay | Admitting: *Deleted

## 2015-11-06 MED ORDER — CARBAMAZEPINE ER 100 MG PO TB12: 500.0000 mg | ORAL_TABLET | Freq: Two times a day (BID) | ORAL | Status: DC

## 2015-11-06 NOTE — Telephone Encounter (Signed)
I LMVM for pt on home and mobile # to call back with clarification of pharmacy.  May tell operator and then they will forward message to me.

## 2015-11-07 NOTE — Telephone Encounter (Signed)
Patient returned Sandy's call. Pharmacy is [facility], states it's the same pharmacy that she has used for the past 2 years.

## 2015-11-07 NOTE — Telephone Encounter (Signed)
Fax confirmation [facility].

## 2016-01-12 ENCOUNTER — Other Ambulatory Visit: Payer: Self-pay | Admitting: Nurse Practitioner

## 2016-01-14 ENCOUNTER — Other Ambulatory Visit: Payer: Self-pay | Admitting: Obstetrics

## 2016-01-14 DIAGNOSIS — Z1231 Encounter for screening mammogram for malignant neoplasm of breast: Secondary | ICD-10-CM

## 2016-01-15 ENCOUNTER — Telehealth: Payer: Self-pay | Admitting: *Deleted

## 2016-01-15 MED ORDER — CARBAMAZEPINE ER 100 MG PO TB12: 500.0000 mg | ORAL_TABLET | Freq: Two times a day (BID) | ORAL | Status: DC

## 2016-01-15 NOTE — Telephone Encounter (Signed)
Rx for Phenobarbital faxed and confirmed to [facility] at [phone].

## 2016-01-21 NOTE — Telephone Encounter (Signed)
Still had the original rx w/ the fax confirmation from 01/15/16.  Faxed again today - receipt confirmed.  Called patient to let her know this had been done.

## 2016-01-21 NOTE — Telephone Encounter (Signed)
Patient called, states she received phone call from [facility], was advised, prescription for Phenobarbital has not been received.

## 2016-01-24 ENCOUNTER — Telehealth: Payer: Self-pay

## 2016-01-24 ENCOUNTER — Telehealth: Payer: Self-pay | Admitting: *Deleted

## 2016-01-24 NOTE — Telephone Encounter (Signed)
I agree with instructions given to patient. TG

## 2016-01-24 NOTE — Telephone Encounter (Signed)
Received paper message form patient stating her pharmacy still has not received faxed refill for phenobarbital.  Called [facility] and spoke with pharmacist, Nidi. Gave her verbal refill with one additional refill.

## 2016-01-24 NOTE — Telephone Encounter (Signed)
Gypsie lvm stating that she was returning a call to Sagar. She said that it could be medication related.  I called Lisania back. She is not a patient at our clinic. She said that she received a few vms from [place] and was given our office number to call back. The number that she received the messages on is: [phone]. She said that she is a patient at [facility]. I explained that our office is pediatric neurology and that the message could have been an error. She said that she was having difficulty getting a refill on her seizure medication and that she was waiting to hear back from [facility] regarding the refill authorization. I suggested that she call [facility] to discuss.

## 2016-01-24 NOTE — Telephone Encounter (Signed)
Patient returned call

## 2016-01-24 NOTE — Telephone Encounter (Signed)
Pt called in after getting off the phone with [facility] and is being told they have not received the rx for Phenobarbital. She is asking some one call the medication in for her. She will be out of medication in a couple of weeks and it takes at least a week for the medication to be shipped. [facility]- [phone]

## 2016-01-24 NOTE — Telephone Encounter (Signed)
LVM informing patient this RN just spoke with Nidi, pharmacist at [facility] and gave her verbal refill order. Left name, number for any questions.

## 2016-04-04 ENCOUNTER — Ambulatory Visit
Admission: RE | Admit: 2016-04-04 | Discharge: 2016-04-04 | Disposition: A | Discharge status: Home / Self Care | Payer: BLUE CROSS/BLUE SHIELD | Source: Ambulatory Visit | Attending: Obstetrics | Admitting: Obstetrics

## 2016-04-04 DIAGNOSIS — Z1231 Encounter for screening mammogram for malignant neoplasm of breast: Secondary | ICD-10-CM

## 2016-04-21 ENCOUNTER — Telehealth: Payer: Self-pay | Admitting: Nurse Practitioner

## 2016-04-21 NOTE — Telephone Encounter (Signed)
Patient called to advise, she received refill of Tegretol from mail order pharmacy, states she received generic of this medication and remembers NP, CM telling her to only take BRAND of this medication, please call to advise.

## 2016-04-21 NOTE — Telephone Encounter (Signed)
Looking back on med history she has been getting generic since before 2014 . She has not had seizures in years. Please call

## 2016-04-21 NOTE — Telephone Encounter (Addendum)
I called [facility] and spoke to christine, pharmacist.  She stated that prescriptions filled as generic( substitution permitted unless pt has requested BRAND).  Looking at notes I do not see specifically using brand name or notes relating to this.

## 2016-04-22 NOTE — Telephone Encounter (Signed)
I spoke to pt this am.  Relayed that she has been on generic tegretol (carbamazepine).  Pt stated her last sz was 2015.  She was under impression that she had been taking Brand Name.  Changed pharmacy and got new refill and had different name (carbamazepine), but looked the same and she wanted to make sure she was taking the right med.  I told her that yes , that is generic for tegretol and reiterated that she has been taking generic.  She will continue to take this.  If problems or question will call.

## 2016-07-04 ENCOUNTER — Other Ambulatory Visit: Payer: Self-pay | Admitting: *Deleted

## 2016-07-04 MED ORDER — CARBAMAZEPINE ER 100 MG PO TB12: 500.0000 mg | ORAL_TABLET | Freq: Two times a day (BID) | ORAL | 3 refills | Status: DC

## 2016-07-04 NOTE — Telephone Encounter (Signed)
Spoke to pharmacist at [facility].  They have converged, and no longer transferring medications.  They did not have the latest tegretol prescription.   I redid to the [facility] in [place], [place] which is where new prescriptions need to go.  (90 days supply with 3 refills).

## 2016-07-25 ENCOUNTER — Ambulatory Visit (INDEPENDENT_AMBULATORY_CARE_PROVIDER_SITE_OTHER): Payer: BLUE CROSS/BLUE SHIELD | Admitting: Nurse Practitioner

## 2016-07-25 ENCOUNTER — Encounter: Payer: Self-pay | Admitting: Nurse Practitioner

## 2016-07-25 ENCOUNTER — Other Ambulatory Visit: Payer: Self-pay | Admitting: Nurse Practitioner

## 2016-07-25 VITALS — BP 132/72 | HR 66 | Ht 64.0 in | Wt 129.4 lb

## 2016-07-25 DIAGNOSIS — G40209 Localization-related (focal) (partial) symptomatic epilepsy and epileptic syndromes with complex partial seizures, not intractable, without status epilepticus: Secondary | ICD-10-CM

## 2016-07-25 DIAGNOSIS — Z5181 Encounter for therapeutic drug level monitoring: Secondary | ICD-10-CM

## 2016-07-25 DIAGNOSIS — G40309 Generalized idiopathic epilepsy and epileptic syndromes, not intractable, without status epilepticus: Secondary | ICD-10-CM

## 2016-07-25 MED ORDER — PHENOBARBITAL 64.8 MG PO TABS: ORAL_TABLET | ORAL | 5 refills | Status: DC

## 2016-07-25 NOTE — Progress Notes (Signed)
Received fax confirmation phenobarbital. [phone].

## 2016-07-25 NOTE — Patient Instructions (Signed)
Continue Phenobarb and Tegretol at current dose.will refill  Will check labs today, CBC, CMP, CBZ level and Phenobarb level Call for seizure activity F/U yearly and prn

## 2016-07-25 NOTE — Progress Notes (Signed)
[facility]  PATIENT: Brenda Woods DOB: 12-22-1955   REASON FOR VISIT: Follow-up for history of generalized seizure disorder HISTORY FROM: Patient    HISTORY OF PRESENT ILLNESS:Ms. Pettengill, 61 year old female returns for yearly followup, with a history of seizure disorder with last seizure occurring in May 2015 and then previously in 1990.  Her last seizure occurred in the grocery store and was reaching for something when she started having jerking of her extremities. 911 was called. She has no recollection of the events. According to the hospital record she was dehydrated. Her Tegretol dose was increased to 500 mg twice daily. She is also on phenobarbital.  On return visit today she has not had recurrent seizure activity, she needs refills on her medications and labs. No new no new neurologic complaints  HISTORY: She had a history of seizure disorder that is well controlled on Tegretol and Phenobarbital. Her last seizure occurred in 1990. Denies side effects to her medication. She had been followed at [facility] since 1968 with onset of seizures at age 41. She has had multiple trials of medications. She has no other significant medical problems.    REVIEW OF SYSTEMS: Full 14 system review of systems performed and notable only for those listed, all others are neg:  Constitutional: neg  Cardiovascular: neg Ear/Nose/Throat: neg  Skin: neg Eyes: neg Respiratory: neg Gastroitestinal: neg  Hematology/Lymphatic: neg  Endocrine: neg Musculoskeletal:neg Allergy/Immunology: neg Neurological: neg Psychiatric: neg Sleep : neg   ALLERGIES: No Known Allergies  HOME MEDICATIONS: Outpatient Medications Prior to Visit  Medication Sig Dispense Refill  . carbamazepine (TEGRETOL XR) 100 MG 12 hr tablet Take 5 tablets (500 mg total) by mouth 2 (two) times daily. 900 tablet 3  . PHENobarbital (LUMINAL) 64.8 MG tablet TAKE 2 BY MOUTH AT BEDTIME 180 tablet 5  . calcium  citrate-vitamin D (CITRACAL+D) 315-200 MG-UNIT per tablet Take 1 tablet by mouth 2 (two) times daily.    . Omega-3 Fatty Acids (FISH OIL) 1200 MG CAPS Take by mouth daily.     No facility-administered medications prior to visit.     PAST MEDICAL HISTORY: Past Medical History:  Diagnosis Date  . Allergy   . Arthritis    elbows  . Seizure ([place])    last seizure 1991    PAST SURGICAL HISTORY: Past Surgical History:  Procedure Laterality Date  . ANKLE FRACTURE SURGERY  07-05-08    FAMILY HISTORY: Family History  Problem Relation Age of Onset  . Breast cancer Maternal Aunt   . Colon cancer Neg Hx   . Esophageal cancer Neg Hx   . Rectal cancer Neg Hx   . Stomach cancer Neg Hx     SOCIAL HISTORY: Social History   Social History  . Marital status: Married    Spouse name: Legrand Como  . Number of children: 0  . Years of education: 12   Occupational History  .  [employer]   Social History Main Topics  . Smoking status: Never Smoker  . Smokeless tobacco: Never Used  . Alcohol use No  . Drug use: No  . Sexual activity: Not on file   Other Topics Concern  . Not on file   Social History Narrative   Patient is married to [place].    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    Patient has a high school education.   Patient is right-handed.  Patient drinks about 2 glasses of soda daily.     PHYSICAL EXAM  Vitals:   07/25/16 0813  BP: 132/72  Pulse: 66  Weight: 129 lb 6.4 oz (58.7 kg)  Height: 5\' 4"  (1.626 m)   Body mass index is 22.21 kg/m. Generalized: Well developed, in no acute distress  Neurological examination  Mentation: Alert oriented to time, place, history taking. Follows all commands speech and language fluent  Cranial nerve II-XII: Pupils were equal round reactive to light extraocular movements were full, visual field were full on confrontational test. Facial sensation and  strength were normal. hearing was intact to finger rubbing bilaterally. Uvula tongue midline. head turning and shoulder shrug were normal and symmetric.Tongue protrusion into cheek strength was normal.  Motor: normal bulk and tone, full strength in the BUE, BLE, fine finger movements normal, no pronator drift. No focal weakness  Coordination: finger-nose-finger, heel-to-shin bilaterally, no dysmetria  Reflexes: 1+ upper lower and symmetric  Gait and Station: Rising up from seated position without assistance, normal stance, moderate stride, good arm swing, smooth turning, able to perform tiptoe, and heel walking without difficulty. Tandem gait is steady .No assistive device    DIAGNOSTIC DATA (LABS, IMAGING, TESTING) -  ASSESSMENT AND PLAN 61 y.o. year old female has a past medical history of Seizure Disorder here to follow-up. Last seizure occurred in May 2015.    Continue Phenobarb and Tegretol at current dose.will refill  Will check labs today, CBC, CMP, CBZ level and Phenobarb level to monitor therapeutic ranges Call for seizure activity F/U yearly and prn Dennie Bible, [facility], [facility], APRN  [facility] [address], [place], [place] [address] [phone]

## 2016-07-26 LAB — COMPREHENSIVE METABOLIC PANEL
ALK PHOS: 116 IU/L (ref 39–117)
ALT: 12 IU/L (ref 0–32)
AST: 20 IU/L (ref 0–40)
Albumin/Globulin Ratio: 1.5 (ref 1.2–2.2)
Albumin: 4.4 g/dL (ref 3.6–4.8)
BUN/Creatinine Ratio: 26 (ref 12–28)
BUN: 13 mg/dL (ref 8–27)
Bilirubin Total: <0.2 mg/dL (ref 0.0–1.2)
CO2: 23 mmol/L (ref 18–29)
CREATININE: 0.5 mg/dL — AB (ref 0.57–1.00)
Calcium: 9.2 mg/dL (ref 8.7–10.3)
Chloride: 91 mmol/L — ABNORMAL LOW (ref 96–106)
GFR calc Af Amer: 120 mL/min/{1.73_m2} (ref >59)
GFR calc non Af Amer: 104 mL/min/{1.73_m2} (ref >59)
GLOBULIN, TOTAL: 2.9 g/dL (ref 1.5–4.5)
Glucose: 88 mg/dL (ref 65–99)
POTASSIUM: 4.7 mmol/L (ref 3.5–5.2)
SODIUM: 131 mmol/L — AB (ref 134–144)
Total Protein: 7.3 g/dL (ref 6.0–8.5)

## 2016-07-26 LAB — CBC WITH DIFFERENTIAL/PLATELET
BASOS ABS: 0 10*3/uL (ref 0.0–0.2)
Basos: 0 %
EOS (ABSOLUTE): 0.1 10*3/uL (ref 0.0–0.4)
Eos: 2 %
Hematocrit: 35.6 % (ref 34.0–46.6)
Hemoglobin: 12.4 g/dL (ref 11.1–15.9)
IMMATURE GRANULOCYTES: 0 %
Immature Grans (Abs): 0 10*3/uL (ref 0.0–0.1)
Lymphocytes Absolute: 1.8 10*3/uL (ref 0.7–3.1)
Lymphs: 40 %
MCH: 32.1 pg (ref 26.6–33.0)
MCHC: 34.8 g/dL (ref 31.5–35.7)
MCV: 92 fL (ref 79–97)
MONOS ABS: 0.6 10*3/uL (ref 0.1–0.9)
Monocytes: 13 %
NEUTROS PCT: 45 %
Neutrophils Absolute: 2.1 10*3/uL (ref 1.4–7.0)
PLATELETS: 326 10*3/uL (ref 150–379)
RBC: 3.86 x10E6/uL (ref 3.77–5.28)
RDW: 14.4 % (ref 12.3–15.4)
WBC: 4.7 10*3/uL (ref 3.4–10.8)

## 2016-07-26 LAB — CARBAMAZEPINE LEVEL, TOTAL: CARBAMAZEPINE LVL: 8.5 ug/mL (ref 4.0–12.0)

## 2016-07-26 LAB — PHENOBARBITAL LEVEL: Phenobarbital, Serum: 21 ug/mL (ref 15–40)

## 2016-10-16 ENCOUNTER — Telehealth: Payer: Self-pay | Admitting: *Deleted

## 2016-10-16 NOTE — Telephone Encounter (Signed)
Pt DMV form @ front desk for pick up.

## 2016-10-17 DIAGNOSIS — Z0289 Encounter for other administrative examinations: Secondary | ICD-10-CM

## 2017-03-05 ENCOUNTER — Other Ambulatory Visit: Payer: Self-pay | Admitting: Obstetrics

## 2017-03-20 ENCOUNTER — Other Ambulatory Visit: Payer: Self-pay | Admitting: Nurse Practitioner

## 2017-03-23 ENCOUNTER — Other Ambulatory Visit: Payer: Self-pay | Admitting: *Deleted

## 2017-03-23 NOTE — Telephone Encounter (Signed)
Phenobarbital refill Rx faxed to [facility].

## 2017-03-26 ENCOUNTER — Other Ambulatory Visit: Payer: Self-pay | Admitting: Nurse Practitioner

## 2017-03-26 ENCOUNTER — Other Ambulatory Visit: Payer: Self-pay | Admitting: Obstetrics

## 2017-03-26 DIAGNOSIS — Z1231 Encounter for screening mammogram for malignant neoplasm of breast: Secondary | ICD-10-CM

## 2017-04-10 ENCOUNTER — Ambulatory Visit
Admission: RE | Admit: 2017-04-10 | Discharge: 2017-04-10 | Disposition: A | Discharge status: Home / Self Care | Payer: BLUE CROSS/BLUE SHIELD | Source: Ambulatory Visit | Attending: Nurse Practitioner | Admitting: Nurse Practitioner

## 2017-04-10 DIAGNOSIS — Z1231 Encounter for screening mammogram for malignant neoplasm of breast: Secondary | ICD-10-CM

## 2017-06-19 ENCOUNTER — Other Ambulatory Visit: Payer: Self-pay | Admitting: Nurse Practitioner

## 2017-06-23 NOTE — Telephone Encounter (Signed)
Faxed to [facility]

## 2017-06-23 NOTE — Telephone Encounter (Signed)
Please print if ok to refill.

## 2017-07-30 NOTE — Progress Notes (Signed)
[facility]  PATIENT: Brenda Woods DOB: Aug 21, 1955   REASON FOR VISIT: Follow-up for history of generalized seizure disorder HISTORY FROM: Patient    HISTORY OF PRESENT ILLNESS:Brenda Woods, 62 year old female returns for yearly followup, with a history of seizure disorder with last seizure occurring in May 2015 and then previously in 1990.  Her last seizure occurred in the grocery store and was reaching for something when she started having jerking of her extremities. 911 was called. She has no recollection of the events. According to the hospital record she was dehydrated. Her Tegretol dose was increased to 500 mg twice daily. She is also on phenobarbital.  On return visit today she has not had recurrent seizure activity, she needs refills on her medications and labs. No new  neurologic complaints  HISTORY: She had a history of seizure disorder that is well controlled on Tegretol and Phenobarbital. Her last seizure occurred in 1990. Denies side effects to her medication. She had been followed at [facility] since 1968 with onset of seizures at age 9. She has had multiple trials of medications. She has no other significant medical problems.    REVIEW OF SYSTEMS: Full 14 system review of systems performed and notable only for those listed, all others are neg:  Constitutional: neg  Cardiovascular: neg Ear/Nose/Throat: neg  Skin: neg Eyes: neg Respiratory: neg Gastroitestinal: neg  Hematology/Lymphatic: neg  Endocrine: neg Musculoskeletal:neg Allergy/Immunology: neg Neurological: neg Psychiatric: neg Sleep : neg   ALLERGIES: No Known Allergies  HOME MEDICATIONS: Outpatient Medications Prior to Visit  Medication Sig Dispense Refill  . carbamazepine (TEGRETOL XR) 100 MG 12 hr tablet TAKE 5 TABLETS BY MOUTH TWICE DAILY 900 tablet 0  . PHENobarbital (LUMINAL) 64.8 MG tablet TAKE 2 TABLETS BY MOUTH AT BEDTIME. GENERIC EQUIVALENT FOR LUMINAL 180 tablet 0  . calcium  citrate-vitamin D (CITRACAL+D) 315-200 MG-UNIT per tablet Take 1 tablet by mouth 2 (two) times daily.    . Omega-3 Fatty Acids (FISH OIL) 1200 MG CAPS Take by mouth daily.     No facility-administered medications prior to visit.     PAST MEDICAL HISTORY: Past Medical History:  Diagnosis Date  . Allergy   . Arthritis    elbows  . Seizure ([place])    last seizure 1991    PAST SURGICAL HISTORY: Past Surgical History:  Procedure Laterality Date  . ANKLE FRACTURE SURGERY  07-05-08    FAMILY HISTORY: Family History  Problem Relation Age of Onset  . Breast cancer Maternal Aunt   . Colon cancer Neg Hx   . Esophageal cancer Neg Hx   . Rectal cancer Neg Hx   . Stomach cancer Neg Hx     SOCIAL HISTORY: Social History   Socioeconomic History  . Marital status: Married    Spouse name: Brenda Woods  . Number of children: 0  . Years of education: 37  . Highest education level: Not on file  Social Needs  . Financial resource strain: Not on file  . Food insecurity - worry: Not on file  . Food insecurity - inability: Not on file  . Transportation needs - medical: Not on file  . Transportation needs - non-medical: Not on file  Occupational History    Employer: [employer]  Tobacco Use  . Smoking status: Never Smoker  . Smokeless tobacco: Never Used  Substance and Sexual Activity  . Alcohol use: No  . Drug use: No  . Sexual activity: Not on file  Other Topics Concern  .  Not on file  Social History Narrative   Patient is married to [place].    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    Patient has a high school education.   Patient is right-handed.   Patient drinks about 2 glasses of soda daily.     PHYSICAL EXAM  Vitals:   07/31/17 0754  BP: (!) 146/72  Pulse: 68  Weight: 131 lb 3.2 oz (59.5 kg)   Body mass index is 22.52 kg/m. Generalized: Well developed, in no acute distress  Neurological  examination  Mentation: Alert oriented to time, place, history taking. Follows all commands speech and language fluent  Cranial nerve II-XII: Pupils were equal round reactive to light extraocular movements were full, visual field were full on confrontational test. Facial sensation and strength were normal. hearing was intact to finger rubbing bilaterally. Uvula tongue midline. head turning and shoulder shrug were normal and symmetric.Tongue protrusion into cheek strength was normal.  Motor: normal bulk and tone, full strength in the BUE, BLE, fine finger movements normal, no pronator drift. No focal weakness  Coordination: finger-nose-finger, heel-to-shin bilaterally, no dysmetria  Reflexes: 1+ upper lower and symmetric  Gait and Station: Rising up from seated position without assistance, normal stance, moderate stride, good arm swing, smooth turning, able to perform tiptoe, and heel walking without difficulty. Tandem gait is steady    DIAGNOSTIC DATA (LABS, IMAGING, TESTING) -  ASSESSMENT AND PLAN 62 y.o. year old female has a past medical history of Seizure Disorder here to follow-up. Last seizure occurred in May 2015.    Continue Phenobarb and Tegretol at current dose. Will check labs today, CBC, CMP, to monitor adverse effects of Tegretol and phenobarbital CBZ level and Phenobarb level to monitor therapeutic ranges Call for seizure activity F/U yearly and prn Brenda Woods, [facility], [facility], APRN  [facility] [address], [place], [place] [address] [phone]

## 2017-07-31 ENCOUNTER — Ambulatory Visit: Payer: BLUE CROSS/BLUE SHIELD | Admitting: Nurse Practitioner

## 2017-07-31 ENCOUNTER — Encounter: Payer: Self-pay | Admitting: Nurse Practitioner

## 2017-07-31 VITALS — BP 146/72 | HR 68 | Wt 131.2 lb

## 2017-07-31 DIAGNOSIS — G40209 Localization-related (focal) (partial) symptomatic epilepsy and epileptic syndromes with complex partial seizures, not intractable, without status epilepticus: Secondary | ICD-10-CM

## 2017-07-31 DIAGNOSIS — Z5181 Encounter for therapeutic drug level monitoring: Secondary | ICD-10-CM

## 2017-07-31 DIAGNOSIS — G40309 Generalized idiopathic epilepsy and epileptic syndromes, not intractable, without status epilepticus: Secondary | ICD-10-CM | POA: Diagnosis not present

## 2017-07-31 MED ORDER — CARBAMAZEPINE ER 100 MG PO TB12: ORAL_TABLET | ORAL | 3 refills | Status: DC

## 2017-07-31 NOTE — Progress Notes (Signed)
I have reviewed and agreed above plan. 

## 2017-07-31 NOTE — Patient Instructions (Signed)
Continue Phenobarb and Tegretol at current dose.will refill  Will check labs today, CBC, CMP, CBZ level and Phenobarb level to monitor therapeutic ranges Call for seizure activity F/U yearly and prn

## 2017-08-01 LAB — CBC WITH DIFFERENTIAL/PLATELET
BASOS ABS: 0 10*3/uL (ref 0.0–0.2)
Basos: 1 %
EOS (ABSOLUTE): 0.1 10*3/uL (ref 0.0–0.4)
Eos: 2 %
HEMOGLOBIN: 12 g/dL (ref 11.1–15.9)
Hematocrit: 34.8 % (ref 34.0–46.6)
Immature Grans (Abs): 0 10*3/uL (ref 0.0–0.1)
Immature Granulocytes: 0 %
LYMPHS ABS: 1.7 10*3/uL (ref 0.7–3.1)
Lymphs: 42 %
MCH: 31.8 pg (ref 26.6–33.0)
MCHC: 34.5 g/dL (ref 31.5–35.7)
MCV: 92 fL (ref 79–97)
Monocytes Absolute: 0.5 10*3/uL (ref 0.1–0.9)
Monocytes: 12 %
NEUTROS ABS: 1.8 10*3/uL (ref 1.4–7.0)
Neutrophils: 43 %
Platelets: 302 10*3/uL (ref 150–379)
RBC: 3.77 x10E6/uL (ref 3.77–5.28)
RDW: 13.7 % (ref 12.3–15.4)
WBC: 4 10*3/uL (ref 3.4–10.8)

## 2017-08-01 LAB — COMPREHENSIVE METABOLIC PANEL
ALBUMIN: 4.3 g/dL (ref 3.6–4.8)
ALK PHOS: 120 IU/L — AB (ref 39–117)
ALT: 20 IU/L (ref 0–32)
AST: 28 IU/L (ref 0–40)
Albumin/Globulin Ratio: 1.7 (ref 1.2–2.2)
BUN / CREAT RATIO: 20 (ref 12–28)
BUN: 11 mg/dL (ref 8–27)
CHLORIDE: 91 mmol/L — AB (ref 96–106)
CO2: 22 mmol/L (ref 20–29)
Calcium: 9.4 mg/dL (ref 8.7–10.3)
Creatinine, Ser: 0.54 mg/dL — ABNORMAL LOW (ref 0.57–1.00)
GFR calc Af Amer: 116 mL/min/{1.73_m2} (ref >59)
GFR calc non Af Amer: 101 mL/min/{1.73_m2} (ref >59)
GLOBULIN, TOTAL: 2.6 g/dL (ref 1.5–4.5)
Glucose: 87 mg/dL (ref 65–99)
Potassium: 4.6 mmol/L (ref 3.5–5.2)
SODIUM: 130 mmol/L — AB (ref 134–144)
Total Protein: 6.9 g/dL (ref 6.0–8.5)

## 2017-08-01 LAB — CARBAMAZEPINE LEVEL, TOTAL: CARBAMAZEPINE LVL: 7.2 ug/mL (ref 4.0–12.0)

## 2017-08-04 ENCOUNTER — Telehealth: Payer: Self-pay | Admitting: *Deleted

## 2017-08-04 NOTE — Telephone Encounter (Signed)
LVM informing patient that her labs are stable. Left number for any questions.

## 2017-10-14 ENCOUNTER — Other Ambulatory Visit: Payer: Self-pay | Admitting: Nurse Practitioner

## 2017-10-15 NOTE — Telephone Encounter (Signed)
Fax confirmation received [place] [phone].

## 2018-02-08 ENCOUNTER — Other Ambulatory Visit: Payer: Self-pay | Admitting: Nurse Practitioner

## 2018-02-08 DIAGNOSIS — Z1231 Encounter for screening mammogram for malignant neoplasm of breast: Secondary | ICD-10-CM

## 2018-03-23 ENCOUNTER — Other Ambulatory Visit: Payer: Self-pay | Admitting: Nurse Practitioner

## 2018-03-24 ENCOUNTER — Other Ambulatory Visit: Payer: Self-pay | Admitting: Nurse Practitioner

## 2018-03-24 MED ORDER — PHENOBARBITAL 64.8 MG PO TABS: ORAL_TABLET | ORAL | 1 refills | Status: DC

## 2018-04-16 ENCOUNTER — Ambulatory Visit
Admission: RE | Admit: 2018-04-16 | Discharge: 2018-04-16 | Disposition: A | Discharge status: Home / Self Care | Payer: BLUE CROSS/BLUE SHIELD | Source: Ambulatory Visit | Attending: Nurse Practitioner | Admitting: Nurse Practitioner

## 2018-04-16 DIAGNOSIS — Z1231 Encounter for screening mammogram for malignant neoplasm of breast: Secondary | ICD-10-CM

## 2018-07-30 NOTE — Progress Notes (Signed)
[facility]  PATIENT: Cait Locust Magallon DOB: 01/22/1956   REASON FOR VISIT: Follow-up for history of generalized seizure disorder HISTORY FROM: Patient    HISTORY OF PRESENT ILLNESS:Ms. Darko, 63 year old female returns for yearly followup, with a history of seizure disorder with last seizure occurring in May 2015 and then previously in 1990.  Her last seizure occurred in the grocery store and was reaching for something when she started having jerking of her extremities. 911 was called. She has no recollection of the events. According to the hospital record she was dehydrated. Her Tegretol dose was increased to 500 mg twice daily. She is also on phenobarbital.  On return visit today she has not had recurrent seizure activity, she needs refills on her medications and labs. No new  neurologic complaints.  No interval medical issues.  She continues to work full-time.  She continues to drive a car without difficulty  HISTORY: She had a history of seizure disorder that is well controlled on Tegretol and Phenobarbital. Her last seizure occurred in 1990. Denies side effects to her medication. She had been followed at [facility] since 1968 with onset of seizures at age 49. She has had multiple trials of medications. She has no other significant medical problems.    REVIEW OF SYSTEMS: Full 14 system review of systems performed and notable only for those listed, all others are neg:  Constitutional: neg  Cardiovascular: neg Ear/Nose/Throat: neg  Skin: neg Eyes: neg Respiratory: neg Gastroitestinal: neg  Hematology/Lymphatic: neg  Endocrine: neg Musculoskeletal:neg Allergy/Immunology: neg Neurological: History of generalized seizure disorder Psychiatric: neg Sleep : neg   ALLERGIES: No Known Allergies  HOME MEDICATIONS: Outpatient Medications Prior to Visit  Medication Sig Dispense Refill  . carbamazepine (TEGRETOL XR) 100 MG 12 hr tablet TAKE 5 TABLETS BY MOUTH TWICE  DAILY 900 tablet 3  . PHENobarbital (LUMINAL) 64.8 MG tablet TAKE 2 TABLETS BY MOUTH AT BEDTIME. GENERIC EQUIVALENT FOR LUMINAL 180 tablet 1  . calcium citrate-vitamin D (CITRACAL+D) 315-200 MG-UNIT per tablet Take 1 tablet by mouth 2 (two) times daily.    . Omega-3 Fatty Acids (FISH OIL) 1200 MG CAPS Take by mouth daily.     No facility-administered medications prior to visit.     PAST MEDICAL HISTORY: Past Medical History:  Diagnosis Date  . Allergy   . Arthritis    elbows  . Seizure ([place])    last seizure 1991    PAST SURGICAL HISTORY: Past Surgical History:  Procedure Laterality Date  . ANKLE FRACTURE SURGERY  07-05-08    FAMILY HISTORY: Family History  Problem Relation Age of Onset  . Breast cancer Maternal Aunt   . Colon cancer Neg Hx   . Esophageal cancer Neg Hx   . Rectal cancer Neg Hx   . Stomach cancer Neg Hx     SOCIAL HISTORY: Social History   Socioeconomic History  . Marital status: Married    Spouse name: Legrand Como  . Number of children: 0  . Years of education: 69  . Highest education level: Not on file  Occupational History    Employer: [employer]  Social Needs  . Financial resource strain: Not on file  . Food insecurity:    Worry: Not on file    Inability: Not on file  . Transportation needs:    Medical: Not on file    Non-medical: Not on file  Tobacco Use  . Smoking status: Never Smoker  . Smokeless tobacco: Never Used  Substance  and Sexual Activity  . Alcohol use: No  . Drug use: No  . Sexual activity: Not on file  Lifestyle  . Physical activity:    Days per week: Not on file    Minutes per session: Not on file  . Stress: Not on file  Relationships  . Social connections:    Talks on phone: Not on file    Gets together: Not on file    Attends religious service: Not on file    Active member of club or organization: Not on file    Attends meetings of clubs or organizations: Not on file    Relationship status: Not on  file  . Intimate partner violence:    Fear of current or ex partner: Not on file    Emotionally abused: Not on file    Physically abused: Not on file    Forced sexual activity: Not on file  Other Topics Concern  . Not on file  Social History Narrative   Patient is married to [place].    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    Patient has a high school education.   Patient is right-handed.   Patient drinks about 2 glasses of soda daily.     PHYSICAL EXAM  Vitals:   08/02/18 0901  BP: 120/61  Pulse: 61  Weight: 123 lb 12.8 oz (56.2 kg)  Height: 5\' 3"  (1.6 m)   Body mass index is 21.93 kg/m. Generalized: Well developed, in no acute distress  Neurological examination  Mentation: Alert oriented to time, place, history taking. Follows all commands speech and language fluent  Cranial nerve II-XII: Pupils were equal round reactive to light extraocular movements were full, visual field were full on confrontational test. Facial sensation and strength were normal. hearing was intact to finger rubbing bilaterally. Uvula tongue midline. head turning and shoulder shrug were normal and symmetric.Tongue protrusion into cheek strength was normal.  Motor: normal bulk and tone, full strength in the BUE, BLE, fine finger movements normal, no pronator drift. No focal weakness  Coordination: finger-nose-finger, heel-to-shin bilaterally, no dysmetria  Reflexes: 1+ upper lower and symmetric  Gait and Station: Rising up from seated position without assistance, normal stance, moderate stride, good arm swing, smooth turning, able to perform tiptoe, and heel walking without difficulty. Tandem gait is steady .  No assistive device   DIAGNOSTIC DATA (LABS, IMAGING, TESTING) -  ASSESSMENT AND PLAN 63 y.o. year old female has a past medical history of Seizure Disorder here to follow-up. Last seizure occurred in May 2015.    Continue  Phenobarb and Tegretol at current dose.  Will refill once labs are back Will check labs today, CBC, CMP, to monitor adverse effects of Tegretol and phenobarbital CBZ level and Phenobarb level to monitor therapeutic ranges Call for seizure activity F/U yearly and prn Dennie Bible, [facility], [facility], APRN  [facility] [address] [place], [place] [address] [phone]

## 2018-08-02 ENCOUNTER — Ambulatory Visit: Payer: BLUE CROSS/BLUE SHIELD | Admitting: Nurse Practitioner

## 2018-08-02 ENCOUNTER — Encounter: Payer: Self-pay | Admitting: Nurse Practitioner

## 2018-08-02 VITALS — BP 120/61 | HR 61 | Ht 63.0 in | Wt 123.8 lb

## 2018-08-02 DIAGNOSIS — G40209 Localization-related (focal) (partial) symptomatic epilepsy and epileptic syndromes with complex partial seizures, not intractable, without status epilepticus: Secondary | ICD-10-CM | POA: Diagnosis not present

## 2018-08-02 DIAGNOSIS — G40309 Generalized idiopathic epilepsy and epileptic syndromes, not intractable, without status epilepticus: Secondary | ICD-10-CM

## 2018-08-02 DIAGNOSIS — Z5181 Encounter for therapeutic drug level monitoring: Secondary | ICD-10-CM

## 2018-08-02 NOTE — Progress Notes (Signed)
I have reviewed and agreed above plan. 

## 2018-08-02 NOTE — Patient Instructions (Signed)
Continue Phenobarb and Tegretol at current dose. Will check labs today, CBC, CMP, to monitor adverse effects of Tegretol and phenobarbital CBZ level and Phenobarb level to monitor therapeutic ranges Call for seizure activity F/U yearly and prn

## 2018-08-03 ENCOUNTER — Other Ambulatory Visit: Payer: Self-pay | Admitting: Nurse Practitioner

## 2018-08-03 ENCOUNTER — Encounter: Payer: Self-pay | Admitting: *Deleted

## 2018-08-03 ENCOUNTER — Telehealth: Payer: Self-pay | Admitting: *Deleted

## 2018-08-03 LAB — CBC WITH DIFFERENTIAL/PLATELET
BASOS ABS: 0 10*3/uL (ref 0.0–0.2)
BASOS: 1 %
EOS (ABSOLUTE): 0.3 10*3/uL (ref 0.0–0.4)
Eos: 8 %
Hematocrit: 33.4 % — ABNORMAL LOW (ref 34.0–46.6)
Hemoglobin: 11.6 g/dL (ref 11.1–15.9)
Immature Grans (Abs): 0 10*3/uL (ref 0.0–0.1)
Immature Granulocytes: 0 %
LYMPHS ABS: 1.4 10*3/uL (ref 0.7–3.1)
Lymphs: 41 %
MCH: 32.4 pg (ref 26.6–33.0)
MCHC: 34.7 g/dL (ref 31.5–35.7)
MCV: 93 fL (ref 79–97)
MONOS ABS: 0.5 10*3/uL (ref 0.1–0.9)
Monocytes: 13 %
NEUTROS ABS: 1.3 10*3/uL — AB (ref 1.4–7.0)
Neutrophils: 37 %
PLATELETS: 308 10*3/uL (ref 150–450)
RBC: 3.58 x10E6/uL — ABNORMAL LOW (ref 3.77–5.28)
RDW: 12.6 % (ref 11.7–15.4)
WBC: 3.5 10*3/uL (ref 3.4–10.8)

## 2018-08-03 LAB — COMPREHENSIVE METABOLIC PANEL
A/G RATIO: 1.7 (ref 1.2–2.2)
ALT: 15 IU/L (ref 0–32)
AST: 23 IU/L (ref 0–40)
Albumin: 4.4 g/dL (ref 3.8–4.8)
Alkaline Phosphatase: 166 IU/L — ABNORMAL HIGH (ref 39–117)
BUN / CREAT RATIO: 23 (ref 12–28)
BUN: 12 mg/dL (ref 8–27)
CALCIUM: 9 mg/dL (ref 8.7–10.3)
CHLORIDE: 93 mmol/L — AB (ref 96–106)
CO2: 23 mmol/L (ref 20–29)
Creatinine, Ser: 0.52 mg/dL — ABNORMAL LOW (ref 0.57–1.00)
GFR, EST AFRICAN AMERICAN: 117 mL/min/{1.73_m2} (ref >59)
GFR, EST NON AFRICAN AMERICAN: 101 mL/min/{1.73_m2} (ref >59)
GLOBULIN, TOTAL: 2.6 g/dL (ref 1.5–4.5)
Glucose: 89 mg/dL (ref 65–99)
POTASSIUM: 4.3 mmol/L (ref 3.5–5.2)
SODIUM: 131 mmol/L — AB (ref 134–144)
TOTAL PROTEIN: 7 g/dL (ref 6.0–8.5)

## 2018-08-03 LAB — CARBAMAZEPINE LEVEL, TOTAL: CARBAMAZEPINE LVL: 9.2 ug/mL (ref 4.0–12.0)

## 2018-08-03 LAB — PHENOBARBITAL LEVEL: Phenobarbital, Serum: 22 ug/mL (ref 15–40)

## 2018-08-03 MED ORDER — PHENOBARBITAL 64.8 MG PO TABS: ORAL_TABLET | ORAL | 1 refills | Status: DC

## 2018-08-03 MED ORDER — CARBAMAZEPINE ER 100 MG PO TB12: ORAL_TABLET | ORAL | 3 refills | Status: DC

## 2018-08-03 NOTE — Telephone Encounter (Signed)
Phenobarbital refill Rx successfully faxed to [facility]. My chart message sent re: labs are stable.

## 2018-08-09 ENCOUNTER — Telehealth: Payer: Self-pay | Admitting: Nurse Practitioner

## 2018-08-09 NOTE — Telephone Encounter (Signed)
Pt states she is approaching 42 and does not have a family doctor.  Pt would like a call back with a recommended family Dr from NP Hoyle Sauer

## 2018-08-09 NOTE — Telephone Encounter (Signed)
Unable to get in contact with Brenda Woods. I left her a voicemail asking her to return my call. Office number was provided

## 2018-08-09 NOTE — Telephone Encounter (Signed)
I have no idea who is taking new patients.Try [facility] they have lots of locations.

## 2018-08-10 NOTE — Telephone Encounter (Signed)
Noted! Thank you

## 2018-08-10 NOTE — Telephone Encounter (Signed)
Pt called back, Message from [place] relayed, pt was appreciative.

## 2018-08-12 ENCOUNTER — Encounter: Payer: Self-pay | Admitting: Gastroenterology

## 2018-09-12 ENCOUNTER — Other Ambulatory Visit: Payer: Self-pay | Admitting: Nurse Practitioner

## 2018-09-18 ENCOUNTER — Other Ambulatory Visit: Payer: Self-pay | Admitting: Nurse Practitioner

## 2019-01-03 ENCOUNTER — Ambulatory Visit (AMBULATORY_SURGERY_CENTER): Payer: Self-pay

## 2019-01-03 ENCOUNTER — Other Ambulatory Visit: Payer: Self-pay

## 2019-01-03 VITALS — Ht 63.0 in | Wt 128.0 lb

## 2019-01-03 DIAGNOSIS — Z86010 Personal history of colonic polyps: Secondary | ICD-10-CM

## 2019-01-03 MED ORDER — NA SULFATE-K SULFATE-MG SULF 17.5-3.13-1.6 GM/177ML PO SOLN: 1.0000 | Freq: Once | ORAL | 0 refills | Status: AC

## 2019-01-03 NOTE — Progress Notes (Signed)
Denies allergies to eggs or soy products. Denies complication of anesthesia or sedation. Denies use of weight loss medication. Denies use of O2.   Emmi instructions given for colonoscopy.  Pre-Visit was conducted by phone due to Covid 19. Instructions were reviewed and mailed to patients confirmed home address. I was not completely finished going over the instructions for taking the prep when the patient stated that she had to go because she was at work and had to go back in. The patient said, just send me the instructions and she could read them. Bekah said that she would call if she had any questions. I wanted to complete the visit but the patient insisted that she had to go. Patient was encouraged to call if she had any questions.

## 2019-01-05 ENCOUNTER — Encounter: Payer: Self-pay | Admitting: Gastroenterology

## 2019-01-14 ENCOUNTER — Telehealth: Payer: Self-pay | Admitting: Gastroenterology

## 2019-01-14 NOTE — Telephone Encounter (Signed)

## 2019-01-17 ENCOUNTER — Encounter: Payer: Self-pay | Admitting: Gastroenterology

## 2019-01-17 ENCOUNTER — Ambulatory Visit (AMBULATORY_SURGERY_CENTER): Payer: BC Managed Care – PPO | Admitting: Gastroenterology

## 2019-01-17 ENCOUNTER — Other Ambulatory Visit: Payer: Self-pay

## 2019-01-17 VITALS — BP 129/66 | HR 61 | Temp 98.5°F | Resp 25 | Ht 63.0 in | Wt 128.0 lb

## 2019-01-17 DIAGNOSIS — K635 Polyp of colon: Secondary | ICD-10-CM

## 2019-01-17 DIAGNOSIS — D123 Benign neoplasm of transverse colon: Secondary | ICD-10-CM

## 2019-01-17 DIAGNOSIS — Z86010 Personal history of colonic polyps: Secondary | ICD-10-CM | POA: Diagnosis not present

## 2019-01-17 MED ORDER — SODIUM CHLORIDE 0.9 % IV SOLN: 500.0000 mL | Freq: Once | INTRAVENOUS | Status: DC

## 2019-01-17 NOTE — Progress Notes (Signed)
Pt's states no medical or surgical changes since previsit or office visit. 

## 2019-01-17 NOTE — Progress Notes (Signed)
[place]- Temp JB- Vitals

## 2019-01-17 NOTE — Patient Instructions (Signed)
Handouts given for polyps and hemorrhoids.  YOU HAD AN ENDOSCOPIC PROCEDURE TODAY AT THE [facility]:   Refer to the procedure report that was given to you for any specific questions about what was found during the examination.  If the procedure report does not answer your questions, please call your gastroenterologist to clarify.  If you requested that your care partner not be given the details of your procedure findings, then the procedure report has been included in a sealed envelope for you to review at your convenience later.  YOU SHOULD EXPECT: Some feelings of bloating in the abdomen. Passage of more gas than usual.  Walking can help get rid of the air that was put into your GI tract during the procedure and reduce the bloating. If you had a lower endoscopy (such as a colonoscopy or flexible sigmoidoscopy) you may notice spotting of blood in your stool or on the toilet paper. If you underwent a bowel prep for your procedure, you may not have a normal bowel movement for a few days.  Please Note:  You might notice some irritation and congestion in your nose or some drainage.  This is from the oxygen used during your procedure.  There is no need for concern and it should clear up in a day or so.  SYMPTOMS TO REPORT IMMEDIATELY:   Following lower endoscopy (colonoscopy or flexible sigmoidoscopy):  Excessive amounts of blood in the stool  Significant tenderness or worsening of abdominal pains  Swelling of the abdomen that is new, acute  Fever of 100F or higher  For urgent or emergent issues, a gastroenterologist can be reached at any hour by calling [phone].   DIET:  We do recommend a small meal at first, but then you may proceed to your regular diet.  Drink plenty of fluids but you should avoid alcoholic beverages for 24 hours.  ACTIVITY:  You should plan to take it easy for the rest of today and you should NOT DRIVE or use heavy machinery until tomorrow (because of the  sedation medicines used during the test).    FOLLOW UP: Our staff will call the number listed on your records 48-72 hours following your procedure to check on you and address any questions or concerns that you may have regarding the information given to you following your procedure. If we do not reach you, we will leave a message.  We will attempt to reach you two times.  During this call, we will ask if you have developed any symptoms of COVID 19. If you develop any symptoms (ie: fever, flu-like symptoms, shortness of breath, cough etc.) before then, please call [phone].  If you test positive for Covid 19 in the 2 weeks post procedure, please call and report this information to us.    If any biopsies were taken you will be contacted by phone or by letter within the next 1-3 weeks.  Please call us at [phone] if you have not heard about the biopsies in 3 weeks.    SIGNATURES/CONFIDENTIALITY: You and/or your care partner have signed paperwork which will be entered into your electronic medical record.  These signatures attest to the fact that that the information above on your After Visit Summary has been reviewed and is understood.  Full responsibility of the confidentiality of this discharge information lies with you and/or your care-partner. 

## 2019-01-17 NOTE — Op Note (Signed)
[facility] Patient Name: Brenda Woods Procedure Date: 01/17/2019 10:07 AM MRN: [id number] Endoscopist: Mauri Pole , MD Age: 63 Referring MD:  Date of Birth: 1955/09/11 Gender: Female Account #: [id number] Procedure:                Colonoscopy Indications:              High risk colon cancer surveillance: Personal                            history of colonic polyps Medicines:                Monitored Anesthesia Care Procedure:                Pre-Anesthesia Assessment:                           - Prior to the procedure, a History and Physical                            was performed, and patient medications and                            allergies were reviewed. The patient's tolerance of                            previous anesthesia was also reviewed. The risks                            and benefits of the procedure and the sedation                            options and risks were discussed with the patient.                            All questions were answered, and informed consent                            was obtained. Prior Anticoagulants: The patient has                            taken no previous anticoagulant or antiplatelet                            agents. ASA Grade Assessment: II - A patient with                            mild systemic disease. After reviewing the risks                            and benefits, the patient was deemed in                            satisfactory condition to undergo the procedure.  After obtaining informed consent, the colonoscope                            was passed under direct vision. Throughout the                            procedure, the patient's blood pressure, pulse, and                            oxygen saturations were monitored continuously. The                            Model PCF-[id number]) scope was introduced                            through the anus and advanced to  the the cecum,                            identified by appendiceal orifice and ileocecal                            valve. The colonoscopy was technically difficult                            and complex due to restricted mobility of the colon                            and a tortuous colon. Successful completion of the                            procedure was aided by applying abdominal pressure.                            The patient tolerated the procedure well. The                            quality of the bowel preparation was good. The                            ileocecal valve, appendiceal orifice, and rectum                            were photographed. Scope In: 10:14:33 AM Scope Out: 10:39:59 AM Scope Withdrawal Time: 0 hours 9 minutes 59 seconds  Total Procedure Duration: 0 hours 25 minutes 26 seconds  Findings:                 The perianal and digital rectal examinations were                            normal.                           A 1 mm polyp was found in the hepatic flexure. The  polyp was sessile. The polyp was removed with a                            cold biopsy forceps. Resection and retrieval were                            complete.                           Non-bleeding internal hemorrhoids were found during                            retroflexion. The hemorrhoids were small.                           The exam was otherwise without abnormality. Complications:            No immediate complications. Estimated Blood Loss:     Estimated blood loss was minimal. Impression:               - One 1 mm polyp at the hepatic flexure, removed                            with a cold biopsy forceps. Resected and retrieved.                           - Non-bleeding internal hemorrhoids.                           - The examination was otherwise normal. Recommendation:           - Patient has a contact number available for                             emergencies. The signs and symptoms of potential                            delayed complications were discussed with the                            patient. Return to normal activities tomorrow.                            Written discharge instructions were provided to the                            patient.                           - Resume previous diet.                           - Continue present medications.                           - Await pathology results.                           -  Repeat colonoscopy in 7-10 years for surveillance                            based on pathology results. Mauri Pole, MD 01/17/2019 10:44:13 AM This report has been signed electronically.

## 2019-01-17 NOTE — Progress Notes (Signed)
Called to room to assist during endoscopic procedure.  Patient ID and intended procedure confirmed with present staff. Received instructions for my participation in the procedure from the performing physician.  

## 2019-01-17 NOTE — Progress Notes (Signed)
A/ox3, pleased with MAC, report to RN 

## 2019-01-19 ENCOUNTER — Telehealth: Payer: Self-pay | Admitting: *Deleted

## 2019-01-19 NOTE — Telephone Encounter (Signed)
1. Have you developed a fever since your procedure? no  2.   Have you had an respiratory symptoms (SOB or cough) since your procedure? no  3.   Have you tested positive for COVID 19 since your procedure no  4.   Have you had any family members/close contacts diagnosed with the COVID 19 since your procedure?  no   If yes to any of these questions please route to Joylene John, RN and Alphonsa Gin, Therapist, sports.  Follow up Call-  Call back number 01/17/2019  Post procedure Call Back phone  # [phone]  Permission to leave phone message Yes  Some recent data might be hidden     Patient questions:  Do you have a fever, pain , or abdominal swelling? No. Pain Score  0 *  Have you tolerated food without any problems? Yes.    Have you been able to return to your normal activities? Yes.    Do you have any questions about your discharge instructions: Diet   No. Medications  No. Follow up visit  No.  Do you have questions or concerns about your Care? No.  Actions: * If pain score is 4 or above: No action needed, pain <4.

## 2019-01-25 ENCOUNTER — Encounter: Payer: Self-pay | Admitting: Gastroenterology

## 2019-03-29 ENCOUNTER — Other Ambulatory Visit: Payer: Self-pay | Admitting: Family Medicine

## 2019-03-29 ENCOUNTER — Other Ambulatory Visit: Payer: Self-pay | Admitting: Adult Health

## 2019-03-29 DIAGNOSIS — Z1231 Encounter for screening mammogram for malignant neoplasm of breast: Secondary | ICD-10-CM

## 2019-04-11 ENCOUNTER — Other Ambulatory Visit: Payer: Self-pay

## 2019-04-11 MED ORDER — PHENOBARBITAL 64.8 MG PO TABS: ORAL_TABLET | ORAL | 1 refills | Status: DC

## 2019-04-11 NOTE — Telephone Encounter (Signed)
 Database Verified LR: 01-02-2019 Qty: 180 Pending appointment: 1-29-2021Debbora Presto, NP)

## 2019-05-16 ENCOUNTER — Other Ambulatory Visit: Payer: Self-pay

## 2019-05-16 ENCOUNTER — Ambulatory Visit: Payer: BC Managed Care – PPO

## 2019-05-16 ENCOUNTER — Ambulatory Visit
Admission: RE | Admit: 2019-05-16 | Discharge: 2019-05-16 | Disposition: A | Discharge status: Home / Self Care | Payer: BC Managed Care – PPO | Source: Ambulatory Visit | Attending: Adult Health | Admitting: Adult Health

## 2019-05-16 DIAGNOSIS — Z1231 Encounter for screening mammogram for malignant neoplasm of breast: Secondary | ICD-10-CM

## 2019-05-19 ENCOUNTER — Telehealth: Payer: Self-pay | Admitting: *Deleted

## 2019-05-19 NOTE — Telephone Encounter (Signed)
I called pt and relayed that we got her mammogram testing results.  I relayed that important for her to have a pcp for future.  She is 1 yrs of age.  I use [facility]. Dr. Ethlyn Gallery.  Check with insurance for in network providers.  Speak with family, friends for referrals.  She apologized for using amy lomax,np, will work on getting pcp.  She will be getting results in mail.

## 2019-08-05 ENCOUNTER — Ambulatory Visit: Payer: BLUE CROSS/BLUE SHIELD | Admitting: Family Medicine

## 2019-08-08 ENCOUNTER — Other Ambulatory Visit: Payer: Self-pay

## 2019-08-08 ENCOUNTER — Encounter: Payer: Self-pay | Admitting: Family Medicine

## 2019-08-08 ENCOUNTER — Ambulatory Visit: Payer: BC Managed Care – PPO | Admitting: Family Medicine

## 2019-08-08 VITALS — BP 170/75 | HR 65 | Temp 97.1°F | Ht 64.0 in | Wt 127.4 lb

## 2019-08-08 DIAGNOSIS — R569 Unspecified convulsions: Secondary | ICD-10-CM

## 2019-08-08 DIAGNOSIS — R030 Elevated blood-pressure reading, without diagnosis of hypertension: Secondary | ICD-10-CM

## 2019-08-08 MED ORDER — CARBAMAZEPINE ER 100 MG PO TB12: ORAL_TABLET | ORAL | 3 refills | Status: DC

## 2019-08-08 MED ORDER — PHENOBARBITAL 64.8 MG PO TABS: ORAL_TABLET | ORAL | 1 refills | Status: DC

## 2019-08-08 NOTE — Progress Notes (Signed)
PATIENT: Brenda Woods DOB: 1955-12-18  REASON FOR VISIT: follow up HISTORY FROM: patient  Chief Complaint  Patient presents with  . Follow-up    RM2. Alone. No questions, No concerns. States that she is doing well.     HISTORY OF PRESENT ILLNESS:  Brenda Woods is a 64 y.o. female here today for follow up for seizure. She continues carbamazepine 500mg  twice daily and phenobarbital 129.6mg  at bedtime (two 64.8mg  tablets). She denies recent seizure activity or missed doses of medication. She denies obvious adverse effects. She is staying well hydrated. She does not have an established PCP. She reports a history of white coat syndrome. She does not check BP at home. She denies chest pain, shortness or breath, dizziness or headaches.   HISTORY: (copied from [employer] note on 08/02/2018)  [name], 64 year old female returns for yearly followup, with a history of seizure disorder with last seizure occurring in May 2015 and then previously in 1990.  Her last seizure occurred in the grocery store and was reaching for something when she started having jerking of her extremities. 911 was called. She has no recollection of the events. According to the hospital record she was dehydrated. Her Tegretol dose was increased to 500 mg twice daily. She is also on phenobarbital.  On return visit today she has not had recurrent seizure activity, she needs refills on her medications and labs. No new  neurologic complaints.  No interval medical issues.  She continues to work full-time.  She continues to drive a car without difficulty  HISTORY: She had a history of seizure disorder that is well controlled on Tegretol and Phenobarbital. Her last seizure occurred in 1990. Denies side effects to her medication. She had been followed at [facility] with onset of seizures at age 35. She has had multiple trials of medications. She has no other significant medical problems.    REVIEW OF SYSTEMS:  Out of a complete 14 system review of symptoms, the patient complains only of the following symptoms, none and all other reviewed systems are negative.  ALLERGIES: No Known Allergies  HOME MEDICATIONS: Outpatient Medications Prior to Visit  Medication Sig Dispense Refill  . naproxen sodium (ALEVE) 220 MG tablet Take 220 mg by mouth as needed. OTC    . carbamazepine (TEGRETOL XR) 100 MG 12 hr tablet TAKE 5 TABLETS BY MOUTH TWICE DAILY 900 tablet 3  . PHENobarbital (LUMINAL) 64.8 MG tablet TAKE 2 TABLETS BY MOUTH AT BEDTIME. GENERIC EQUIVALENT FOR LUMINAL 180 tablet 1   No facility-administered medications prior to visit.    PAST MEDICAL HISTORY: Past Medical History:  Diagnosis Date  . Allergy   . Arthritis    elbows  . Cataract   . Osteopenia   . Seizure ([place])    last seizure 1991    PAST SURGICAL HISTORY: Past Surgical History:  Procedure Laterality Date  . ANKLE FRACTURE SURGERY  07-05-08    FAMILY HISTORY: Family History  Problem Relation Age of Onset  . Breast cancer Maternal Aunt   . Colon cancer Neg Hx   . Esophageal cancer Neg Hx   . Rectal cancer Neg Hx   . Stomach cancer Neg Hx     SOCIAL HISTORY: Social History   Socioeconomic History  . Marital status: Married    Spouse name: Legrand Como  . Number of children: 0  . Years of education: 17  . Highest education level: Not on file  Occupational History  Employer: [employer]  Tobacco Use  . Smoking status: Never Smoker  . Smokeless tobacco: Never Used  Substance and Sexual Activity  . Alcohol use: No  . Drug use: No  . Sexual activity: Not on file  Other Topics Concern  . Not on file  Social History Narrative   Patient is married to [place].    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    Patient has a high school education.   Patient is right-handed.   Patient drinks about 2 glasses of soda daily.   Social Determinants  of Health   Financial Resource Strain:   . Difficulty of Paying Living Expenses: Not on file  Food Insecurity:   . Worried About Charity fundraiser in the Last Year: Not on file  . Ran Out of Food in the Last Year: Not on file  Transportation Needs:   . Lack of Transportation (Medical): Not on file  . Lack of Transportation (Non-Medical): Not on file  Physical Activity:   . Days of Exercise per Week: Not on file  . Minutes of Exercise per Session: Not on file  Stress:   . Feeling of Stress : Not on file  Social Connections:   . Frequency of Communication with Friends and Family: Not on file  . Frequency of Social Gatherings with Friends and Family: Not on file  . Attends Religious Services: Not on file  . Active Member of Clubs or Organizations: Not on file  . Attends Archivist Meetings: Not on file  . Marital Status: Not on file  Intimate Partner Violence:   . Fear of Current or Ex-Partner: Not on file  . Emotionally Abused: Not on file  . Physically Abused: Not on file  . Sexually Abused: Not on file      PHYSICAL EXAM  Vitals:   08/08/19 0823  BP: (!) 170/75  Pulse: 65  Temp: (!) 97.1 F (36.2 C)  Weight: 127 lb 6.4 oz (57.8 kg)  Height: 5\' 4"  (1.626 m)   Body mass index is 21.87 kg/m.  Generalized: Well developed, in no acute distress  Cardiology: normal rate and rhythm, no murmur noted Respiratory: clear to auscultation bilaterally  Neurological examination  Mentation: Alert oriented to time, place, history taking. Follows all commands speech and language fluent Cranial nerve II-XII: Pupils were equal round reactive to light. Extraocular movements were full, visual field were full on confrontational test. Facial sensation and strength were normal. Uvula tongue midline. Head turning and shoulder shrug  were normal and symmetric. Motor: The motor testing reveals 5 over 5 strength of all 4 extremities. Good symmetric motor tone is noted throughout.   Sensory: Sensory testing is intact to soft touch on all 4 extremities. No evidence of extinction is noted.  Coordination: Cerebellar testing reveals good finger-nose-finger and heel-to-shin bilaterally.  Gait and station: Gait is normal.   DIAGNOSTIC DATA (LABS, IMAGING, TESTING) - I reviewed patient records, labs, notes, testing and imaging myself where available.  No flowsheet data found.   Lab Results  Component Value Date   WBC 3.5 08/02/2018   HGB 11.6 08/02/2018   HCT 33.4 (L) 08/02/2018   MCV 93 08/02/2018   PLT 308 08/02/2018      Component Value Date/Time   NA 131 (L) 08/02/2018 0921   K 4.3 08/02/2018 0921   CL 93 (L) 08/02/2018 0921   CO2 23 08/02/2018  [id number]   GLUCOSE 89 08/02/2018 0921   GLUCOSE 92 11/06/2013 0520   BUN 12 08/02/2018 0921   CREATININE 0.52 (L) 08/02/2018 0921   CALCIUM 9.0 08/02/2018 0921   PROT 7.0 08/02/2018 0921   ALBUMIN 4.4 08/02/2018 0921   AST 23 08/02/2018 0921   ALT 15 08/02/2018 0921   ALKPHOS 166 (H) 08/02/2018 0921   BILITOT <0.2 08/02/2018 0921   GFRNONAA 101 08/02/2018 0921   GFRAA 117 08/02/2018 0921   No results found for: CHOL, HDL, LDLCALC, LDLDIRECT, TRIG, CHOLHDL No results found for: HGBA1C No results found for: VITAMINB12 Lab Results  Component Value Date   TSH 2.000 11/06/2013       ASSESSMENT AND PLAN 64 y.o. year old female  has a past medical history of Allergy, Arthritis, Cataract, Osteopenia, and Seizure ([place]). here with     ICD-10-CM   1. Seizure ([place])  R56.9 Carbamazepine level, total    Phenytoin Level, Total    CBC with Differential/Platelets    CMP  2. Elevated BP without diagnosis of hypertension  R03.0     Kayann is doing well on carbamazepine and phenobarbital as prescribed. She will continue current treatment regimen. We will update labs today. She will keep a close eye on her BP at home. BP rechecked in office and 160/80. She is asymptomatic. She was given name for PCP in her area. She was  advised to establish care to monitor BP and CPE. Adequate hydration, well balanced diet and regular exercise advised. She will follow up in 1 year, sooner if needed. She verbalizes understanding and agreement with this plan.    Orders Placed This Encounter  Procedures  . Carbamazepine level, total  . Phenytoin Level, Total  . CBC with Differential/Platelets  . CMP     Meds ordered this encounter  Medications  . DISCONTD: PHENobarbital (LUMINAL) 64.8 MG tablet    Sig: TAKE 2 TABLETS BY MOUTH AT BEDTIME. GENERIC EQUIVALENT FOR LUMINAL    Dispense:  180 tablet    Refill:  1    Order Specific Question:   Supervising Provider    Answer:   Melvenia Beam [id number]  . carbamazepine (TEGRETOL XR) 100 MG 12 hr tablet    Sig: TAKE 5 TABLETS BY MOUTH TWICE DAILY    Dispense:  900 tablet    Refill:  3    Order Specific Question:   Supervising Provider    Answer:   Melvenia Beam [id number]  . PHENobarbital (LUMINAL) 64.8 MG tablet    Sig: TAKE 2 TABLETS BY MOUTH AT BEDTIME. GENERIC EQUIVALENT FOR LUMINAL    Dispense:  180 tablet    Refill:  1    Order Specific Question:   Supervising Provider    Answer:   Bess Harvest, FNP-C 08/08/2019, 10:54 AM [facility] [address], [place], [place] [address] [phone]

## 2019-08-08 NOTE — Patient Instructions (Addendum)
[facility] [address] [place], [place] [address]  [phone]    We will continue Tegretol and phenobarbital as prescribed.   Stay well hydrated   Establish care with PCP for BP monitoring and general care.   Follow up in 1 year, sooner if needed   Seizure, Adult A seizure is a sudden burst of abnormal electrical activity in the brain. Seizures usually last from 30 seconds to 2 minutes. They can cause many different symptoms. Usually, seizures are not harmful unless they last a long time. What are the causes? Common causes of this condition include:  Fever or infection.  Conditions that affect the brain, such as: ? A brain abnormality that you were born with. ? A brain or head injury. ? Bleeding in the brain. ? A tumor. ? Stroke. ? Brain disorders such as autism or cerebral palsy.  Low blood sugar.  Conditions that are passed from parent to child (are inherited).  Problems with substances, such as: ? Having a reaction to a drug or a medicine. ? Suddenly stopping the use of a substance (withdrawal). In some cases, the cause may not be known. A person who has repeated seizures over time without a clear cause has a condition called epilepsy. What increases the risk? You are more likely to get this condition if you have:  A family history of epilepsy.  Had a seizure in the past.  A brain disorder.  A history of head injury, lack of oxygen at birth, or strokes. What are the signs or symptoms? There are many types of seizures. The symptoms vary depending on the type of seizure you have. Examples of symptoms during a seizure include:  Shaking (convulsions).  Stiffness in the body.  Passing out (losing consciousness).  Head nodding.  Staring.  Not responding to sound or touch.  Loss of bladder control and bowel control. Some people have symptoms right before and right after a seizure happens. Symptoms before a seizure may  include:  Fear.  Worry (anxiety).  Feeling like you may vomit (nauseous).  Feeling like the room is spinning (vertigo).  Feeling like you saw or heard something before (dj vu).  Odd tastes or smells.  Changes in how you see. You may see flashing lights or spots. Symptoms after a seizure happens can include:  Confusion.  Sleepiness.  Headache.  Weakness on one side of the body. How is this treated? Most seizures will stop on their own in under 5 minutes. In these cases, no treatment is needed. Seizures that last longer than 5 minutes will usually need treatment. Treatment can include:  Medicines given through an IV tube.  Avoiding things that are known to cause your seizures. These can include medicines that you take for another condition.  Medicines to treat epilepsy.  Surgery to stop the seizures. This may be needed if medicines do not help. Follow these instructions at home: Medicines  Take over-the-counter and prescription medicines only as told by your doctor.  Do not eat or drink anything that may keep your medicine from working, such as alcohol. Activity  Do not do any activities that would be dangerous if you had another seizure, like driving or swimming. Wait until your doctor says it is safe for you to do them.  If you live in the [place] ask your local DMV (department of motor vehicles) when you can drive.  Get plenty of rest. Teaching others Teach friends and family what to do  when you have a seizure. They should:  Lay you on the ground.  Protect your head and body.  Loosen any tight clothing around your neck.  Turn you on your side.  Not hold you down.  Not put anything into your mouth.  Know whether or not you need emergency care.  Stay with you until you are better.  General instructions  Contact your doctor each time you have a seizure.  Avoid anything that gives you seizures.  Keep a seizure diary. Write down: ? What you think  caused each seizure. ? What you remember about each seizure.  Keep all follow-up visits as told by your doctor. This is important. Contact a doctor if:  You have another seizure.  You have seizures more often.  There is any change in what happens during your seizures.  You keep having seizures with treatment.  You have symptoms of being sick or having an infection. Get help right away if:  You have a seizure that: ? Lasts longer than 5 minutes. ? Is different than seizures you had before. ? Makes it harder to breathe. ? Happens after you hurt your head.  You have any of these symptoms after a seizure: ? Not being able to speak. ? Not being able to use a part of your body. ? Confusion. ? A bad headache.  You have two or more seizures in a row.  You do not wake up right after a seizure.  You get hurt during a seizure. These symptoms may be an emergency. Do not wait to see if the symptoms will go away. Get medical help right away. Call your local emergency services (911 in the [place] Do not drive yourself to the hospital. Summary  Seizures usually last from 30 seconds to 2 minutes. Usually, they are not harmful unless they last a long time.  Do not eat or drink anything that may keep your medicine from working, such as alcohol.  Teach friends and family what to do when you have a seizure.  Contact your doctor each time you have a seizure. This information is not intended to replace advice given to you by your health care provider. Make sure you discuss any questions you have with your health care provider. Document Revised: 09/10/2018 Document Reviewed: 09/10/2018 Elsevier Patient Education  [place].  Hypertension, Adult High blood pressure (hypertension) is when the force of blood pumping through the arteries is too strong. The arteries are the blood vessels that carry blood from the heart throughout the body. Hypertension forces the heart to work harder to  pump blood and may cause arteries to become narrow or stiff. Untreated or uncontrolled hypertension can cause a heart attack, heart failure, a stroke, kidney disease, and other problems. A blood pressure reading consists of a higher number over a lower number. Ideally, your blood pressure should be below 120/80. The first ("top") number is called the systolic pressure. It is a measure of the pressure in your arteries as your heart beats. The second ("bottom") number is called the diastolic pressure. It is a measure of the pressure in your arteries as the heart relaxes. What are the causes? The exact cause of this condition is not known. There are some conditions that result in or are related to high blood pressure. What increases the risk? Some risk factors for high blood pressure are under your control. The following factors may make you more likely to develop this condition:  Smoking.  Having type  2 diabetes mellitus, high cholesterol, or both.  Not getting enough exercise or physical activity.  Being overweight.  Having too much fat, sugar, calories, or salt (sodium) in your diet.  Drinking too much alcohol. Some risk factors for high blood pressure may be difficult or impossible to change. Some of these factors include:  Having chronic kidney disease.  Having a family history of high blood pressure.  Age. Risk increases with age.  Race. You may be at higher risk if you are African American.  Gender. Men are at higher risk than women before age 31. After age 34, women are at higher risk than men.  Having obstructive sleep apnea.  Stress. What are the signs or symptoms? High blood pressure may not cause symptoms. Very high blood pressure (hypertensive crisis) may cause:  Headache.  Anxiety.  Shortness of breath.  Nosebleed.  Nausea and vomiting.  Vision changes.  Severe chest pain.  Seizures. How is this diagnosed? This condition is diagnosed by measuring your  blood pressure while you are seated, with your arm resting on a flat surface, your legs uncrossed, and your feet flat on the floor. The cuff of the blood pressure monitor will be placed directly against the skin of your upper arm at the level of your heart. It should be measured at least twice using the same arm. Certain conditions can cause a difference in blood pressure between your right and left arms. Certain factors can cause blood pressure readings to be lower or higher than normal for a short period of time:  When your blood pressure is higher when you are in a health care provider's office than when you are at home, this is called white coat hypertension. Most people with this condition do not need medicines.  When your blood pressure is higher at home than when you are in a health care provider's office, this is called masked hypertension. Most people with this condition may need medicines to control blood pressure. If you have a high blood pressure reading during one visit or you have normal blood pressure with other risk factors, you may be asked to:  Return on a different day to have your blood pressure checked again.  Monitor your blood pressure at home for 1 week or longer. If you are diagnosed with hypertension, you may have other blood or imaging tests to help your health care provider understand your overall risk for other conditions. How is this treated? This condition is treated by making healthy lifestyle changes, such as eating healthy foods, exercising more, and reducing your alcohol intake. Your health care provider may prescribe medicine if lifestyle changes are not enough to get your blood pressure under control, and if:  Your systolic blood pressure is above 130.  Your diastolic blood pressure is above 80. Your personal target blood pressure may vary depending on your medical conditions, your age, and other factors. Follow these instructions at home: Eating and  drinking   Eat a diet that is high in fiber and potassium, and low in sodium, added sugar, and fat. An example eating plan is called the DASH (Dietary Approaches to Stop Hypertension) diet. To eat this way: ? Eat plenty of fresh fruits and vegetables. Try to fill one half of your plate at each meal with fruits and vegetables. ? Eat whole grains, such as whole-wheat pasta, brown rice, or whole-grain bread. Fill about one fourth of your plate with whole grains. ? Eat or drink low-fat dairy products, such as  skim milk or low-fat yogurt. ? Avoid fatty cuts of meat, processed or cured meats, and poultry with skin. Fill about one fourth of your plate with lean proteins, such as fish, chicken without skin, beans, eggs, or tofu. ? Avoid pre-made and processed foods. These tend to be higher in sodium, added sugar, and fat.  Reduce your daily sodium intake. Most people with hypertension should eat less than 1,500 mg of sodium a day.  Do not drink alcohol if: ? Your health care provider tells you not to drink. ? You are pregnant, may be pregnant, or are planning to become pregnant.  If you drink alcohol: ? Limit how much you use to:  0-1 drink a day for women.  0-2 drinks a day for men. ? Be aware of how much alcohol is in your drink. In the [place] one drink equals one 12 oz bottle of beer (355 mL), one 5 oz glass of wine (148 mL), or one 1 oz glass of hard liquor (44 mL). Lifestyle   Work with your health care provider to maintain a healthy body weight or to lose weight. Ask what an ideal weight is for you.  Get at least 30 minutes of exercise most days of the week. Activities may include walking, swimming, or biking.  Include exercise to strengthen your muscles (resistance exercise), such as Pilates or lifting weights, as part of your weekly exercise routine. Try to do these types of exercises for 30 minutes at least 3 days a week.  Do not use any products that contain nicotine or tobacco,  such as cigarettes, e-cigarettes, and chewing tobacco. If you need help quitting, ask your health care provider.  Monitor your blood pressure at home as told by your health care provider.  Keep all follow-up visits as told by your health care provider. This is important. Medicines  Take over-the-counter and prescription medicines only as told by your health care provider. Follow directions carefully. Blood pressure medicines must be taken as prescribed.  Do not skip doses of blood pressure medicine. Doing this puts you at risk for problems and can make the medicine less effective.  Ask your health care provider about side effects or reactions to medicines that you should watch for. Contact a health care provider if you:  Think you are having a reaction to a medicine you are taking.  Have headaches that keep coming back (recurring).  Feel dizzy.  Have swelling in your ankles.  Have trouble with your vision. Get help right away if you:  Develop a severe headache or confusion.  Have unusual weakness or numbness.  Feel faint.  Have severe pain in your chest or abdomen.  Vomit repeatedly.  Have trouble breathing. Summary  Hypertension is when the force of blood pumping through your arteries is too strong. If this condition is not controlled, it may put you at risk for serious complications.  Your personal target blood pressure may vary depending on your medical conditions, your age, and other factors. For most people, a normal blood pressure is less than 120/80.  Hypertension is treated with lifestyle changes, medicines, or a combination of both. Lifestyle changes include losing weight, eating a healthy, low-sodium diet, exercising more, and limiting alcohol. This information is not intended to replace advice given to you by your health care provider. Make sure you discuss any questions you have with your health care provider. Document Revised: 03/03/2018 Document Reviewed:  03/03/2018 Elsevier Patient Education  2020 [employer].

## 2019-08-09 LAB — CBC WITH DIFFERENTIAL/PLATELET
Basophils Absolute: 0 10*3/uL (ref 0.0–0.2)
Basos: 1 %
EOS (ABSOLUTE): 0.1 10*3/uL (ref 0.0–0.4)
Eos: 2 %
Hematocrit: 34.3 % (ref 34.0–46.6)
Hemoglobin: 11.8 g/dL (ref 11.1–15.9)
Immature Grans (Abs): 0 10*3/uL (ref 0.0–0.1)
Immature Granulocytes: 0 %
Lymphocytes Absolute: 1.4 10*3/uL (ref 0.7–3.1)
Lymphs: 32 %
MCH: 32.9 pg (ref 26.6–33.0)
MCHC: 34.4 g/dL (ref 31.5–35.7)
MCV: 96 fL (ref 79–97)
Monocytes Absolute: 0.4 10*3/uL (ref 0.1–0.9)
Monocytes: 10 %
Neutrophils Absolute: 2.4 10*3/uL (ref 1.4–7.0)
Neutrophils: 55 %
Platelets: 278 10*3/uL (ref 150–450)
RBC: 3.59 x10E6/uL — ABNORMAL LOW (ref 3.77–5.28)
RDW: 12.5 % (ref 11.7–15.4)
WBC: 4.3 10*3/uL (ref 3.4–10.8)

## 2019-08-09 LAB — COMPREHENSIVE METABOLIC PANEL
ALT: 15 IU/L (ref 0–32)
AST: 21 IU/L (ref 0–40)
Albumin/Globulin Ratio: 1.9 (ref 1.2–2.2)
Albumin: 4.4 g/dL (ref 3.8–4.8)
Alkaline Phosphatase: 135 IU/L — ABNORMAL HIGH (ref 39–117)
BUN/Creatinine Ratio: 18 (ref 12–28)
BUN: 7 mg/dL — ABNORMAL LOW (ref 8–27)
Bilirubin Total: <0.2 mg/dL (ref 0.0–1.2)
CO2: 22 mmol/L (ref 20–29)
Calcium: 8.8 mg/dL (ref 8.7–10.3)
Chloride: 95 mmol/L — ABNORMAL LOW (ref 96–106)
Creatinine, Ser: 0.38 mg/dL — ABNORMAL LOW (ref 0.57–1.00)
GFR calc Af Amer: 129 mL/min/{1.73_m2} (ref >59)
GFR calc non Af Amer: 112 mL/min/{1.73_m2} (ref >59)
Globulin, Total: 2.3 g/dL (ref 1.5–4.5)
Glucose: 84 mg/dL (ref 65–99)
Potassium: 4.6 mmol/L (ref 3.5–5.2)
Sodium: 131 mmol/L — ABNORMAL LOW (ref 134–144)
Total Protein: 6.7 g/dL (ref 6.0–8.5)

## 2019-08-09 LAB — PHENYTOIN LEVEL, TOTAL: Phenytoin (Dilantin), Serum: <0.8 ug/mL — ABNORMAL LOW (ref 10.0–20.0)

## 2019-08-09 LAB — CARBAMAZEPINE LEVEL, TOTAL: Carbamazepine (Tegretol), S: 10.3 ug/mL (ref 4.0–12.0)

## 2019-08-11 ENCOUNTER — Telehealth: Payer: Self-pay | Admitting: *Deleted

## 2019-08-11 NOTE — Telephone Encounter (Signed)
This was added on yesterday. AL made aware by Laqueta Linden In [place].

## 2019-08-11 NOTE — Telephone Encounter (Signed)
Asked Varney Biles in [place] (left her message) about adding phenobarbital level to lab draw pt had yesterday.

## 2019-08-15 ENCOUNTER — Ambulatory Visit: Payer: BC Managed Care – PPO | Attending: Internal Medicine

## 2019-08-15 DIAGNOSIS — Z23 Encounter for immunization: Secondary | ICD-10-CM | POA: Insufficient documentation

## 2019-08-15 NOTE — Progress Notes (Signed)
   Covid-19 Vaccination Clinic  Name:  Brenda Woods    MRN: [id number] DOB: May 13, 1956  08/15/2019  [name] was observed post Covid-19 immunization for 15 minutes without incidence. She was provided with Vaccine Information Sheet and instruction to access the V-Safe system.   [name] was instructed to call 911 with any severe reactions post vaccine: Marland Kitchen Difficulty breathing  . Swelling of your face and throat  . A fast heartbeat  . A bad rash all over your body  . Dizziness and weakness    Immunizations Administered    Name Date Dose VIS Date Route   Pfizer COVID-19 Vaccine 08/15/2019  5:23 PM 0.3 mL 06/17/2019 Intramuscular   Manufacturer: [place]   Lot: [id number]   [place]: [id number]

## 2019-08-17 ENCOUNTER — Telehealth: Payer: Self-pay | Admitting: *Deleted

## 2019-08-17 LAB — SPECIMEN STATUS REPORT

## 2019-08-17 NOTE — Telephone Encounter (Signed)
LMVm for pt on mobile that her labs looked good, sodium touch low but stable from last year.  Will monitor yearly.  Phenobarbital and carbamazepine levels therapeutic.  She is to call back if questions.

## 2019-08-17 NOTE — Telephone Encounter (Signed)
-----   Message from Debbora Presto, NP sent at 08/11/2019  7:52 AM EST ----- Labs look good. Sodium is a touch low but is stable from last year. We will continue to monitor yearly.

## 2019-08-18 LAB — SPECIMEN STATUS REPORT

## 2019-08-18 LAB — PHENOBARBITAL LEVEL: Phenobarbital, Serum: 19 ug/mL (ref 15–40)

## 2019-09-02 NOTE — Progress Notes (Signed)
I reviewed note and agree with plan.   Herron Fero R. Wallace Cogliano, MD 09/02/2019, 11:09 AM Certified in Neurology, Neurophysiology and Neuroimaging  [facility] [address], [address] [place], [place] [address] [phone]  

## 2019-09-09 ENCOUNTER — Ambulatory Visit: Payer: BC Managed Care – PPO | Attending: Internal Medicine

## 2019-09-09 DIAGNOSIS — Z23 Encounter for immunization: Secondary | ICD-10-CM | POA: Insufficient documentation

## 2019-09-09 NOTE — Progress Notes (Signed)
   Covid-19 Vaccination Clinic  Name:  Brenda Woods    MRN: [id number] DOB: Mar 08, 1956  09/09/2019  [name] was observed post Covid-19 immunization for 15 minutes without incident. She was provided with Vaccine Information Sheet and instruction to access the V-Safe system.   [name] was instructed to call 911 with any severe reactions post vaccine: Marland Kitchen Difficulty breathing  . Swelling of face and throat  . A fast heartbeat  . A bad rash all over body  . Dizziness and weakness   Immunizations Administered    Name Date Dose VIS Date Route   Pfizer COVID-19 Vaccine 09/09/2019  3:35 PM 0.3 mL 06/17/2019 Intramuscular   Manufacturer: [place]   Lot: [id number]   [place]: [id number]

## 2019-10-17 ENCOUNTER — Telehealth: Payer: Self-pay | Admitting: Family Medicine

## 2019-10-17 NOTE — Telephone Encounter (Signed)
I called and spoke to husband to call pharmacy and check on any prescription that were escribed 08-08-19 for both these medications.  These were confirmed as received. He will speak to live person.

## 2019-10-17 NOTE — Telephone Encounter (Signed)
[name],[name](husband on DPR) has called for a refill on pt's PHENobarbital (LUMINAL) 64.8 MG tablet & carbamazepine (TEGRETOL XR) 100 MG 12 hr tablet [id number]

## 2020-02-09 ENCOUNTER — Telehealth: Payer: Self-pay | Admitting: Family Medicine

## 2020-02-09 NOTE — Telephone Encounter (Signed)
She should be ok with take ibuprofen and her AEDs. I would recommend taking ibuprofen for the shortest length necessary. She should reach out to PCP if symptoms do not resolve.

## 2020-02-09 NOTE — Telephone Encounter (Signed)
I called and LMVM for pt that returned call, per AL/NP ok to take motrin with AEDs. (take for short period of time).    Seek care with pcp or urgent care if sx do not resolve.  Call back with questions.

## 2020-02-09 NOTE — Telephone Encounter (Signed)
Ok for pt to take motrin with her sz meds listed. Fell over cart at work, saw EMS, did not go to hospital.  Does not feel like broken, swollen and painful.

## 2020-02-09 NOTE — Telephone Encounter (Signed)
Pt called, had a fall at work, asking if ok to take ibuprofen with PHENobarbital (LUMINAL) 64.8 MG tablet and carbamazepine (TEGRETOL XR) 100 MG 12 hr tablet. Has pain in hand and wrist, bruise on face. Pt would like a call from the nurse.

## 2020-02-09 NOTE — Telephone Encounter (Signed)
I called pt and LMVM for tat returned call.  She can speak to her pharmacy about taking motrin with her sz medications. Return call if needed.  How fall??

## 2020-02-16 ENCOUNTER — Other Ambulatory Visit: Payer: Self-pay | Admitting: Obstetrics and Gynecology

## 2020-02-16 ENCOUNTER — Ambulatory Visit
Admission: RE | Admit: 2020-02-16 | Discharge: 2020-02-16 | Disposition: A | Discharge status: Home / Self Care | Payer: BC Managed Care – PPO | Source: Ambulatory Visit | Attending: Obstetrics and Gynecology | Admitting: Obstetrics and Gynecology

## 2020-02-16 DIAGNOSIS — M25539 Pain in unspecified wrist: Secondary | ICD-10-CM

## 2020-03-19 ENCOUNTER — Other Ambulatory Visit: Payer: Self-pay | Admitting: Obstetrics and Gynecology

## 2020-03-19 ENCOUNTER — Ambulatory Visit
Admission: RE | Admit: 2020-03-19 | Discharge: 2020-03-19 | Disposition: A | Discharge status: Home / Self Care | Payer: BC Managed Care – PPO | Source: Ambulatory Visit | Attending: Obstetrics and Gynecology | Admitting: Obstetrics and Gynecology

## 2020-03-19 ENCOUNTER — Other Ambulatory Visit: Payer: Self-pay

## 2020-03-19 DIAGNOSIS — S52501D Unspecified fracture of the lower end of right radius, subsequent encounter for closed fracture with routine healing: Secondary | ICD-10-CM

## 2020-04-19 ENCOUNTER — Telehealth: Payer: Self-pay | Admitting: Family Medicine

## 2020-04-19 ENCOUNTER — Other Ambulatory Visit: Payer: Self-pay | Admitting: Family Medicine

## 2020-04-19 MED ORDER — PHENOBARBITAL 64.8 MG PO TABS: ORAL_TABLET | ORAL | 1 refills | Status: DC

## 2020-04-19 NOTE — Telephone Encounter (Signed)
Pt called very upset needing to speak to the RN regarding her PHENobarbital (LUMINAL) 64.8 MG tablet Please advise.

## 2020-04-19 NOTE — Telephone Encounter (Signed)
Attempted to call pt, LVM that medication has been refilled. Pt may call back if anything else is needed

## 2020-04-19 NOTE — Telephone Encounter (Signed)
Med was refilled through chart. I missed the attached order in your previous encounter but she should be taken care of. Please let me know if she needs anything else.

## 2020-04-19 NOTE — Telephone Encounter (Signed)
Does she just need refill? I am happy to send.

## 2020-04-19 NOTE — Telephone Encounter (Signed)
I called again LMVM.

## 2020-04-25 ENCOUNTER — Other Ambulatory Visit: Payer: Self-pay | Admitting: Obstetrics and Gynecology

## 2020-04-25 DIAGNOSIS — Z1231 Encounter for screening mammogram for malignant neoplasm of breast: Secondary | ICD-10-CM

## 2020-06-05 ENCOUNTER — Ambulatory Visit
Admission: RE | Admit: 2020-06-05 | Discharge: 2020-06-05 | Disposition: A | Discharge status: Home / Self Care | Payer: BC Managed Care – PPO | Source: Ambulatory Visit | Attending: Obstetrics and Gynecology | Admitting: Obstetrics and Gynecology

## 2020-06-05 ENCOUNTER — Other Ambulatory Visit: Payer: Self-pay

## 2020-06-05 DIAGNOSIS — Z1231 Encounter for screening mammogram for malignant neoplasm of breast: Secondary | ICD-10-CM

## 2020-06-06 ENCOUNTER — Other Ambulatory Visit: Payer: Self-pay | Admitting: Obstetrics and Gynecology

## 2020-06-06 DIAGNOSIS — R928 Other abnormal and inconclusive findings on diagnostic imaging of breast: Secondary | ICD-10-CM

## 2020-06-18 ENCOUNTER — Other Ambulatory Visit: Payer: Self-pay

## 2020-06-18 ENCOUNTER — Ambulatory Visit
Admission: RE | Admit: 2020-06-18 | Discharge: 2020-06-18 | Disposition: A | Discharge status: Home / Self Care | Payer: BC Managed Care – PPO | Source: Ambulatory Visit | Attending: Obstetrics and Gynecology | Admitting: Obstetrics and Gynecology

## 2020-06-18 DIAGNOSIS — R928 Other abnormal and inconclusive findings on diagnostic imaging of breast: Secondary | ICD-10-CM

## 2020-08-07 NOTE — Patient Instructions (Signed)
Below is our plan:  We will continue carbamazepine 500mg  twice daily and phenobarbital 129.6mg  at bedtime.   Please make sure you are staying well hydrated. I recommend 50-60 ounces daily. Well balanced diet and regular exercise encouraged.    Please continue follow up with care team as directed.   Follow up with me in 1 year   You may receive a survey regarding today's visit. I encourage you to leave honest feed back as I do use this information to improve patient care. Thank you for seeing me today!      Seizure, Adult A seizure is a sudden burst of abnormal electrical and chemical activity in the brain. Seizures usually last from 30 seconds to 2 minutes.  What are the causes? Common causes of this condition include:  Fever or infection.  Problems that affect the brain. These may include: ? A brain or head injury. ? Bleeding in the brain. ? A brain tumor.  Low levels of blood sugar or salt.  Kidney problems or liver problems.  Conditions that are passed from parent to child (are inherited).  Problems with a substance, such as: ? Having a reaction to a drug or a medicine. ? Stopping the use of a substance all of a sudden (withdrawal).  A stroke.  Disorders that affect how you develop. Sometimes, the cause may not be known.  What increases the risk?  Having someone in your family who has epilepsy. In this condition, seizures happen again and again over time. They have no clear cause.  Having had a tonic-clonic seizure before. This type of seizure causes you to: ? Tighten the muscles of the whole body. ? Lose consciousness.  Having had a head injury or strokes before.  Having had a lack of oxygen at birth. What are the signs or symptoms? There are many types of seizures. The symptoms vary depending on the type of seizure you have. Symptoms during a seizure  Shaking that you cannot control (convulsions) with fast, jerky movements of muscles.  Stiffness of the  body.  Breathing problems.  Feeling mixed up (confused).  Staring or not responding to sound or touch.  Head nodding.  Eyes that blink, flutter, or move fast.  Drooling, grunting, or making clicking sounds with your mouth  Losing control of when you pee or poop. Symptoms before a seizure  Feeling afraid, nervous, or worried.  Feeling like you may vomit.  Feeling like: ? You are moving when you are not. ? Things around you are moving when they are not.  Feeling like you saw or heard something before (dj vu).  Odd tastes or smells.  Changes in how you see. You may see flashing lights or spots. Symptoms after a seizure  Feeling confused.  Feeling sleepy.  Headache.  Sore muscles. How is this treated? If your seizure stops on its own, you will not need treatment. If your seizure lasts longer than 5 minutes, you will normally need treatment. Treatment may include:  Medicines given through an IV tube.  Avoiding things, such as medicines, that are known to cause your seizures.  Medicines to prevent seizures.  A device to prevent or control seizures.  Surgery.  A diet low in carbohydrates and high in fat (ketogenic diet). Follow these instructions at home: Medicines  Take over-the-counter and prescription medicines only as told by your doctor.  Avoid foods or drinks that may keep your medicine from working, such as alcohol. Activity  Follow instructions about driving, swimming,  or doing things that would be dangerous if you had another seizure. Wait until your doctor says it is safe for you to do these things.  If you live in the [place] ask your local department of motor vehicles when you can drive.  Get a lot of rest. Teaching others  Teach friends and family what to do when you have a seizure. They should: ? Help you get down to the ground. ? Protect your head and body. ? Loosen any clothing around your neck. ? Turn you on your side. ? Know whether  or not you need emergency care. ? Stay with you until you are better.  Also, tell them what not to do if you have a seizure. Tell them: ? They should not hold you down. ? They should not put anything in your mouth.   General instructions  Avoid anything that gives you seizures.  Keep a seizure diary. Write down: ? What you remember about each seizure. ? What you think caused each seizure.  Keep all follow-up visits. Contact a doctor if:  You have another seizure or seizures. Call the doctor each time you have a seizure.  The pattern of your seizures changes.  You keep having seizures with treatment.  You have symptoms of being sick or having an infection.  You are not able to take your medicine. Get help right away if:  You have any of these problems: ? A seizure that lasts longer than 5 minutes. ? Many seizures in a row and you do not feel better between seizures. ? A seizure that makes it harder to breathe. ? A seizure and you can no longer speak or use part of your body.  You do not wake up right after a seizure.  You get hurt during a seizure.  You feel confused or have pain right after a seizure. These symptoms may be an emergency. Get help right away. Call your local emergency services (911 in the [place]  Do not wait to see if the symptoms will go away.  Do not drive yourself to the hospital. Summary  A seizure is a sudden burst of abnormal electrical and chemical activity in the brain. Seizures normally last from 30 seconds to 2 minutes.  Causes of seizures include illness, injury to the head, low levels of blood sugar or salt, and certain conditions.  Most seizures will stop on their own in less than 5 minutes. Seizures that last longer than 5 minutes are a medical emergency and need treatment right away.  Many medicines are used to treat seizures. Take over-the-counter and prescription medicines only as told by your doctor. This information is not  intended to replace advice given to you by your health care provider. Make sure you discuss any questions you have with your health care provider. Document Revised: 12/30/2019 Document Reviewed: 12/30/2019 Elsevier Patient Education  [place].

## 2020-08-07 NOTE — Progress Notes (Signed)
PATIENT: Brenda Woods DOB: [birth year]  REASON FOR VISIT: follow up HISTORY FROM: patient  Chief Complaint  Patient presents with  . Follow-up    Rm 1 alone Pt is well, no seizures in last yr     HISTORY OF PRESENT ILLNESS: 08/08/2020 ALL: She returns for seizure follow up. She continues carbamazepine 500mg  BID and phenobarbital 129.6mg  at bedtime. She is doing very well. No seizure activity. She continues to work. She does not have PCP.   08/08/2019 ALL:  Brenda Woods is a 65 y.o. female here today for follow up for seizure. She continues carbamazepine 500mg  twice daily and phenobarbital 129.6mg  at bedtime (two 64.8mg  tablets). She denies recent seizure activity or missed doses of medication. She denies obvious adverse effects. She is staying well hydrated. She does not have an established PCP. She reports a history of white coat syndrome. She does not check BP at home. She denies chest pain, shortness or breath, dizziness or headaches.   HISTORY: (copied from [employer] note on 08/02/2018)  Brenda Woods, 65 year old female returns for yearly followup, with a history of seizure disorder with last seizure occurring in May 2015 and then previously in 1990.  Her last seizure occurred in the grocery store and was reaching for something when she started having jerking of her extremities. 911 was called. She has no recollection of the events. According to the hospital record she was dehydrated. Her Tegretol dose was increased to 500 mg twice daily. She is also on phenobarbital.  On return visit today she has not had recurrent seizure activity, she needs refills on her medications and labs. No new  neurologic complaints.  No interval medical issues.  She continues to work full-time.  She continues to drive a car without difficulty  HISTORY: She had a history of seizure disorder that is well controlled on Tegretol and Phenobarbital. Her last seizure occurred in 1990. Denies side effects  to her medication. She had been followed at [facility] since 1968 with onset of seizures at age 66. She has had multiple trials of medications. She has no other significant medical problems.    REVIEW OF SYSTEMS: Out of a complete 14 system review of symptoms, the patient complains only of the following symptoms, none and all other reviewed systems are negative.  ALLERGIES: No Known Allergies  HOME MEDICATIONS: Outpatient Medications Prior to Visit  Medication Sig Dispense Refill  . naproxen sodium (ALEVE) 220 MG tablet Take 220 mg by mouth as needed. OTC    . carbamazepine (TEGRETOL XR) 100 MG 12 hr tablet TAKE 5 TABLETS BY MOUTH TWICE DAILY 900 tablet 3  . PHENobarbital (LUMINAL) 64.8 MG tablet TAKE 2 TABLETS BY MOUTH AT BEDTIME. GENERIC EQUIVALENT FOR LUMINAL 180 tablet 1   No facility-administered medications prior to visit.    PAST MEDICAL HISTORY: Past Medical History:  Diagnosis Date  . Allergy   . Arthritis    elbows  . Cataract   . Osteopenia   . Seizure ([place])    last seizure 1991    PAST SURGICAL HISTORY: Past Surgical History:  Procedure Laterality Date  . ANKLE FRACTURE SURGERY  07-05-08    FAMILY HISTORY: Family History  Problem Relation Age of Onset  . Breast cancer Maternal Aunt   . Colon cancer Neg Hx   . Esophageal cancer Neg Hx   . Rectal cancer Neg Hx   . Stomach cancer Neg Hx     SOCIAL HISTORY: Social History  Socioeconomic History  . Marital status: Married    Spouse name: Legrand Como  . Number of children: 0  . Years of education: 103  . Highest education level: Not on file  Occupational History    Employer: [employer]  Tobacco Use  . Smoking status: Never Smoker  . Smokeless tobacco: Never Used  Substance and Sexual Activity  . Alcohol use: No  . Drug use: No  . Sexual activity: Not on file  Other Topics Concern  . Not on file  Social History Narrative   Patient is married to Brenda Woods.    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    Patient has a high school education.   Patient is right-handed.   Patient drinks about 2 glasses of soda daily.   Social Determinants of Health   Financial Resource Strain: Not on file  Food Insecurity: Not on file  Transportation Needs: Not on file  Physical Activity: Not on file  Stress: Not on file  Social Connections: Not on file  Intimate Partner Violence: Not on file      PHYSICAL EXAM  Vitals:   08/08/20 0819  BP: 135/73  Pulse: 65  Weight: 132 lb (59.9 kg)  Height: 5\' 3"  (1.6 m)   Body mass index is 23.38 kg/m.  Generalized: Well developed, in no acute distress  Cardiology: normal rate and rhythm, no murmur noted Respiratory: clear to auscultation bilaterally  Neurological examination  Mentation: Alert oriented to time, place, history taking. Follows all commands speech and language fluent Cranial nerve II-XII: Pupils were equal round reactive to light. Extraocular movements were full, visual field were full on confrontational test. Facial sensation and strength were normal. Uvula tongue midline. Head turning and shoulder shrug  were normal and symmetric. Motor: The motor testing reveals 5 over 5 strength of all 4 extremities. Good symmetric motor tone is noted throughout.  Sensory: Sensory testing is intact to soft touch on all 4 extremities. No evidence of extinction is noted.  Coordination: Cerebellar testing reveals good finger-nose-finger and heel-to-shin bilaterally.  Gait and station: Gait is normal.  DTR: normal and symmetric bilaterally   DIAGNOSTIC DATA (LABS, IMAGING, TESTING) - I reviewed patient records, labs, notes, testing and imaging myself where available.  No flowsheet data found.   Lab Results  Component Value Date   WBC 4.3 08/08/2019   HGB 11.8 08/08/2019   HCT 34.3 08/08/2019   MCV 96 08/08/2019   PLT 278 08/08/2019      Component Value Date/Time   NA 131 (L)  08/08/2019 0913   K 4.6 08/08/2019 0913   CL 95 (L) 08/08/2019 0913   CO2 22 08/08/2019 0913   GLUCOSE 84 08/08/2019 0913   GLUCOSE 92 11/06/2013 0520   BUN 7 (L) 08/08/2019 0913   CREATININE 0.38 (L) 08/08/2019 0913   CALCIUM 8.8 08/08/2019 0913   PROT 6.7 08/08/2019 0913   ALBUMIN 4.4 08/08/2019 0913   AST 21 08/08/2019 0913   ALT 15 08/08/2019 0913   ALKPHOS 135 (H) 08/08/2019 0913   BILITOT <0.2 08/08/2019 0913   GFRNONAA 112 08/08/2019 0913   GFRAA 129 08/08/2019 0913   No results found for: CHOL, HDL, LDLCALC, LDLDIRECT, TRIG, CHOLHDL No results found for: HGBA1C No results found for: VITAMINB12 Lab Results  Component Value Date   TSH 2.000 11/06/2013       ASSESSMENT AND PLAN 65 y.o. year old female  has a past medical history of Allergy, Arthritis, Cataract, Osteopenia, and Seizure ([place]). here with     ICD-10-CM   1. Seizure ([place])  R56.9 CBC with Differential/Platelets    CMP    Phenobarbital level    Carbamazepine level, total    Ambulatory referral to [place] is doing well on carbamazepine and phenobarbital as prescribed. She will continue current treatment regimen. We will update labs today. Blood pressures are better. She ihas not been able to find a PCP. Referral placed today. She was advised to establish care to monitor BP and CPE. Adequate hydration, well balanced diet and regular exercise advised. She will follow up in 1 year, sooner if needed. She verbalizes understanding and agreement with this plan.    Orders Placed This Encounter  Procedures  . CBC with Differential/Platelets  . CMP  . Phenobarbital level  . Carbamazepine level, total  . Ambulatory referral to [facility]    Referral Priority:   Routine    Referral Type:   Consultation    Referral Reason:   Specialty Services Required    Requested Specialty:   Family Medicine    Number of Visits Requested:   1     Meds ordered this encounter  Medications  .  carbamazepine (TEGRETOL XR) 100 MG 12 hr tablet    Sig: TAKE 5 TABLETS BY MOUTH TWICE DAILY    Dispense:  900 tablet    Refill:  3    Order Specific Question:   Supervising Provider    Answer:   Melvenia Beam [id number]  . PHENobarbital (LUMINAL) 64.8 MG tablet    Sig: TAKE 2 TABLETS BY MOUTH AT BEDTIME. GENERIC EQUIVALENT FOR LUMINAL    Dispense:  180 tablet    Refill:  1    Order Specific Question:   Supervising Provider    Answer:   Melvenia Beam [[id number]]      GTX MIWOE, FNP-C 08/08/2020, 8:58 AM [facility] [address] [place], [place] [address] [phone]

## 2020-08-08 ENCOUNTER — Ambulatory Visit: Payer: BC Managed Care – PPO | Admitting: Family Medicine

## 2020-08-08 ENCOUNTER — Encounter: Payer: Self-pay | Admitting: Family Medicine

## 2020-08-08 ENCOUNTER — Other Ambulatory Visit: Payer: Self-pay

## 2020-08-08 VITALS — BP 135/73 | HR 65 | Ht 63.0 in | Wt 132.0 lb

## 2020-08-08 DIAGNOSIS — R569 Unspecified convulsions: Secondary | ICD-10-CM

## 2020-08-08 MED ORDER — PHENOBARBITAL 64.8 MG PO TABS: ORAL_TABLET | ORAL | 1 refills | Status: DC

## 2020-08-08 MED ORDER — CARBAMAZEPINE ER 100 MG PO TB12: ORAL_TABLET | ORAL | 3 refills | Status: DC

## 2020-08-09 LAB — COMPREHENSIVE METABOLIC PANEL
ALT: 18 IU/L (ref 0–32)
AST: 23 IU/L (ref 0–40)
Albumin/Globulin Ratio: 1.6 (ref 1.2–2.2)
Albumin: 4.3 g/dL (ref 3.8–4.8)
Alkaline Phosphatase: 132 IU/L — ABNORMAL HIGH (ref 44–121)
BUN/Creatinine Ratio: 22 (ref 12–28)
BUN: 12 mg/dL (ref 8–27)
Bilirubin Total: <0.2 mg/dL (ref 0.0–1.2)
CO2: 26 mmol/L (ref 20–29)
Calcium: 9.2 mg/dL (ref 8.7–10.3)
Chloride: 90 mmol/L — ABNORMAL LOW (ref 96–106)
Creatinine, Ser: 0.54 mg/dL — ABNORMAL LOW (ref 0.57–1.00)
GFR calc Af Amer: 114 mL/min/{1.73_m2} (ref >59)
GFR calc non Af Amer: 99 mL/min/{1.73_m2} (ref >59)
Globulin, Total: 2.7 g/dL (ref 1.5–4.5)
Glucose: 88 mg/dL (ref 65–99)
Potassium: 4.4 mmol/L (ref 3.5–5.2)
Sodium: 129 mmol/L — ABNORMAL LOW (ref 134–144)
Total Protein: 7 g/dL (ref 6.0–8.5)

## 2020-08-09 LAB — CBC WITH DIFFERENTIAL/PLATELET
Basophils Absolute: 0.1 10*3/uL (ref 0.0–0.2)
Basos: 1 %
EOS (ABSOLUTE): 0.1 10*3/uL (ref 0.0–0.4)
Eos: 2 %
Hematocrit: 32.7 % — ABNORMAL LOW (ref 34.0–46.6)
Hemoglobin: 11.5 g/dL (ref 11.1–15.9)
Immature Grans (Abs): 0 10*3/uL (ref 0.0–0.1)
Immature Granulocytes: 0 %
Lymphocytes Absolute: 1.4 10*3/uL (ref 0.7–3.1)
Lymphs: 38 %
MCH: 32.2 pg (ref 26.6–33.0)
MCHC: 35.2 g/dL (ref 31.5–35.7)
MCV: 92 fL (ref 79–97)
Monocytes Absolute: 0.5 10*3/uL (ref 0.1–0.9)
Monocytes: 12 %
Neutrophils Absolute: 1.7 10*3/uL (ref 1.4–7.0)
Neutrophils: 47 %
Platelets: 305 10*3/uL (ref 150–450)
RBC: 3.57 x10E6/uL — ABNORMAL LOW (ref 3.77–5.28)
RDW: 12 % (ref 11.7–15.4)
WBC: 3.7 10*3/uL (ref 3.4–10.8)

## 2020-08-09 LAB — PHENOBARBITAL LEVEL: Phenobarbital, Serum: 21 ug/mL (ref 15–40)

## 2020-08-09 LAB — CARBAMAZEPINE LEVEL, TOTAL: Carbamazepine (Tegretol), S: 9.6 ug/mL (ref 4.0–12.0)

## 2020-08-13 ENCOUNTER — Telehealth: Payer: Self-pay | Admitting: *Deleted

## 2020-08-13 NOTE — Telephone Encounter (Signed)
-----   Message from Debbora Presto, NP sent at 08/09/2020 12:18 PM EST ----- Please let her know that her labs look fairly stable. AED level looks good. Sodium is low but hovering around baseline. I would like to keep a close eye on this. If she gets in with PCP soon, they will probably recheck. If she does not, please have her come back in 4-6 weks to make sure it is not trending down. TY.

## 2020-08-13 NOTE — Progress Notes (Signed)
I reviewed note and agree with plan.   Arley Salamone R. Haile Toppins, MD 08/13/2020, 3:51 PM Certified in Neurology, Neurophysiology and Neuroimaging  [facility] [address], [address] [place], [place] [address] [phone]  

## 2020-08-27 NOTE — Telephone Encounter (Signed)
Mailed result note to pts address.

## 2021-01-16 ENCOUNTER — Other Ambulatory Visit: Payer: Self-pay | Admitting: Obstetrics and Gynecology

## 2021-01-16 DIAGNOSIS — R928 Other abnormal and inconclusive findings on diagnostic imaging of breast: Secondary | ICD-10-CM

## 2021-02-01 ENCOUNTER — Other Ambulatory Visit: Payer: Self-pay | Admitting: Obstetrics and Gynecology

## 2021-02-01 DIAGNOSIS — N63 Unspecified lump in unspecified breast: Secondary | ICD-10-CM

## 2021-02-06 ENCOUNTER — Other Ambulatory Visit: Payer: BC Managed Care – PPO

## 2021-03-04 DIAGNOSIS — G40309 Generalized idiopathic epilepsy and epileptic syndromes, not intractable, without status epilepticus: Secondary | ICD-10-CM | POA: Diagnosis not present

## 2021-03-04 DIAGNOSIS — Z13220 Encounter for screening for lipoid disorders: Secondary | ICD-10-CM | POA: Diagnosis not present

## 2021-03-04 DIAGNOSIS — R748 Abnormal levels of other serum enzymes: Secondary | ICD-10-CM | POA: Diagnosis not present

## 2021-03-04 DIAGNOSIS — Z0000 Encounter for general adult medical examination without abnormal findings: Secondary | ICD-10-CM | POA: Diagnosis not present

## 2021-03-05 ENCOUNTER — Encounter: Payer: Self-pay | Admitting: *Deleted

## 2021-03-05 ENCOUNTER — Telehealth: Payer: Self-pay | Admitting: Family Medicine

## 2021-03-05 NOTE — Telephone Encounter (Signed)
Dawn CMA from [facility] called stating pt had some critical lab results and thinks it is coming from her seizure medication. Dr. Laverna Peace is requesting a call back at [phone].

## 2021-03-05 NOTE — Telephone Encounter (Addendum)
Called [place], spoke with [facility] CMA and informed her Dr Leta Baptist stated carbamazepine was increased due to break through seizure several years ago. Advised I can schedule her to see him Sept 7th, requested labs be faxed for his review. We will call patient to let her know. Dawn verbalized understanding, appreciation and she will inform Dr Lindell Noe. Called patient, LVM advising her PCP called [place] due to lab results. Dr Leta Baptist wants to see her or have VV. I have 03/13/21 at 3 pm on hold for her. Requested a call back, will send info via my chart as well.   Labs received from [place] PCP, placed on MD desk for review.  Per Dr Leta Baptist, no medication change at this time.

## 2021-03-06 ENCOUNTER — Other Ambulatory Visit: Payer: Self-pay | Admitting: Family Medicine

## 2021-03-06 DIAGNOSIS — E2839 Other primary ovarian failure: Secondary | ICD-10-CM

## 2021-03-06 NOTE — Telephone Encounter (Signed)
Thanks

## 2021-03-06 NOTE — Telephone Encounter (Signed)
LVM on mobile #, requested call back. Called home #, spoke with patient to schedule FU per PCP due to lab results. She requested to see Amy, NP, and there was opening tomorrow 3 pm. Patient accepted appointment, verbalized understanding, appreciation. PCP labs given to Amy to review.

## 2021-03-06 NOTE — Progress Notes (Signed)
PATIENT: Brenda Woods DOB: 12-18-1955  REASON FOR VISIT: follow up HISTORY FROM: patient  Chief Complaint  Patient presents with   Follow-up    Pt alone, new rm. 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 She has continued carbamazepine '500mg'$  BID and phenobarbital 129.'6mg'$  at bedtime since last seizure in 2015. Prior to 2015, she had been on phenobarbital and carbamazepine '400mg'$  BID for as far back as she can remember (probably since 1990). Last seen 08/2020 and doing well on current regimen. She was seen by PCP 03/04/2021. Labs revealed sodium level of 126. Baseline 128-131 over past 5 years. CBC 3.71, HGB 12. Liver enzymes and GGT normal.    She has tolerated regimen for many years and very hesitant to switch AED. She denies concerns of mentation changes, weakness or unusual fatigue. She lost her husband and now lives alone. She is driving without difficulty. She is able to manage medications and perform ADLs independently. She does not remember ever taking levetiracetam in the past.   08/08/2020 ALL: She returns for seizure follow up. She continues carbamazepine '500mg'$  BID and phenobarbital 129.'6mg'$  at bedtime. She is doing very well. No seizure activity. She continues to work. She does not have PCP.   08/08/2019 ALL:  Brenda Woods is a 65 y.o. female here today for follow up for seizure. She continues carbamazepine '500mg'$  twice daily and phenobarbital 129.'6mg'$  at bedtime (two 64.'8mg'$  tablets). She denies recent seizure activity or missed doses of medication. She denies obvious adverse effects. She is staying well hydrated. She does not have an established PCP. She reports a history of white coat syndrome. She does not check BP at home. She denies chest pain, shortness or breath, dizziness or headaches.    HISTORY: (copied from [employer] note on 08/02/2018)  Brenda Woods, 65 year old female returns for yearly followup,  with a history of seizure disorder with last seizure occurring in May 2015 and then previously in 1990.  Her last seizure occurred  in the grocery store and was reaching for something when she started having jerking of her extremities. 911 was called. She has no recollection of the events. According to the hospital record she was dehydrated. Her Tegretol dose was increased to 500 mg twice daily. She is also on phenobarbital.  On return visit today she has not had recurrent seizure activity, she needs refills on her medications and labs. No new  neurologic complaints.  No interval medical issues.  She continues to work full-time.  She continues to drive a car without difficulty    HISTORY: She had a history of seizure disorder that is well controlled on Tegretol and Phenobarbital. Her last seizure occurred in 1990. Denies side effects to her medication. She had been followed at [facility] since 1968 with onset of seizures at age 29. She has had multiple trials of medications. She has no other significant medical problems.    REVIEW OF SYSTEMS: Out of a complete 14 system review of symptoms, the patient complains only of the following symptoms, none and all other reviewed systems are negative.  ALLERGIES: No Known Allergies  HOME MEDICATIONS: Outpatient Medications Prior to Visit  Medication Sig Dispense Refill   naproxen sodium (ALEVE) 220 MG tablet Take 220 mg by mouth as needed. OTC  carbamazepine (TEGRETOL XR) 100 MG 12 hr tablet TAKE 5 TABLETS BY MOUTH TWICE DAILY 900 tablet 3   PHENobarbital (LUMINAL) 64.8 MG tablet TAKE 2 TABLETS BY MOUTH AT BEDTIME. GENERIC EQUIVALENT FOR LUMINAL 180 tablet 1   No facility-administered medications prior to visit.    PAST MEDICAL HISTORY: Past Medical History:  Diagnosis Date   Allergy    Arthritis    elbows   Cataract    Osteopenia     Seizure ([place])    last seizure 1991    PAST SURGICAL HISTORY: Past Surgical History:  Procedure Laterality Date   ANKLE FRACTURE SURGERY  07-05-08    FAMILY HISTORY: Family History  Problem Relation Age of Onset   Breast cancer Maternal Aunt    Colon cancer Neg Hx    Esophageal cancer Neg Hx    Rectal cancer Neg Hx    Stomach cancer Neg Hx     SOCIAL HISTORY: Social History   Socioeconomic History   Marital status: Married    Spouse name: Brenda Woods   Number of children: 0   Years of education: 12   Highest education level: Not on file  Occupational History    Employer: [employer]  Tobacco Use   Smoking status: Never   Smokeless tobacco: Never  Substance and Sexual Activity   Alcohol use: No   Drug use: No   Sexual activity: Not on file  Other Topics Concern   Not on file  Social History Narrative   Patient is married to Brenda Woods.    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    Patient has a high school education.   Patient is right-handed.   Patient drinks about 2 glasses of soda daily.   Social Determinants of Health   Financial Resource Strain: Not on file  Food Insecurity: Not on file  Transportation Needs: Not on file  Physical Activity: Not on file  Stress: Not on file  Social Connections: Not on file  Intimate Partner Violence: Not on file      PHYSICAL EXAM  Vitals:   03/07/21 1507  BP: 119/64  Pulse: 76  Weight: 119 lb (54 kg)  Height: '5\' 4"'$  (1.626 m)    Body mass index is 20.43 kg/m.  Generalized: Well developed, in no acute distress  Cardiology: normal rate and rhythm, no murmur noted Respiratory: clear to auscultation bilaterally  Neurological examination  Mentation: Alert oriented to time, place, history taking. Follows all commands speech and language fluent Cranial nerve II-XII: Pupils were equal round reactive to light. Extraocular movements were full,  visual field were full on confrontational test. Facial sensation and strength were normal. Uvula tongue midline. Head turning and shoulder shrug  were normal and symmetric. Motor: The motor testing reveals 5 over 5 strength of all 4 extremities. Good symmetric motor tone is noted throughout.  Sensory: Sensory testing is intact to soft touch on all 4 extremities. No evidence of extinction is noted.  Coordination: Cerebellar testing reveals good finger-nose-finger and heel-to-shin bilaterally.  Gait and station: Gait is normal.  DTR: normal and symmetric bilaterally   DIAGNOSTIC DATA (LABS, IMAGING, TESTING) - I reviewed patient records, labs, notes, testing and imaging myself where available.  No flowsheet data found.   Lab Results  Component Value Date   WBC 3.7 08/08/2020   HGB 11.5 08/08/2020   HCT 32.7 (L) 08/08/2020   MCV 92 08/08/2020  PLT 305 08/08/2020      Component Value Date/Time   NA 129 (L) 08/08/2020 0859   K 4.4 08/08/2020 0859   CL 90 (L) 08/08/2020 0859   CO2 26 08/08/2020 0859   GLUCOSE 88 08/08/2020 0859   GLUCOSE 92 11/06/2013 0520   BUN 12 08/08/2020 0859   CREATININE 0.54 (L) 08/08/2020 0859   CALCIUM 9.2 08/08/2020 0859   PROT 7.0 08/08/2020 0859   ALBUMIN 4.3 08/08/2020 0859   AST 23 08/08/2020 0859   ALT 18 08/08/2020 0859   ALKPHOS 132 (H) 08/08/2020 0859   BILITOT <0.2 08/08/2020 0859   GFRNONAA 99 08/08/2020 0859   GFRAA 114 08/08/2020 0859   No results found for: CHOL, HDL, LDLCALC, LDLDIRECT, TRIG, CHOLHDL No results found for: HGBA1C No results found for: VITAMINB12 Lab Results  Component Value Date   TSH 2.000 11/06/2013       ASSESSMENT AND PLAN 65 y.o. year old female  has a past medical history of Allergy, Arthritis, Cataract, Osteopenia, and Seizure ([place]). here with     ICD-10-CM   1. Seizure ([place])  R56.9         Kesley is doing well on carbamazepine and phenobarbital as prescribed, however, recent lab abnormalities  suggest need to decrease carbamazepine. I will have her continue current regimen of phenobarbital 129.'6mg'$  daily and bedtime and carbamazepine '500mg'$  BID for now. We will add levetiracetam '250mg'$  BID. Once she has taken levetiracetam '250mg'$  BID, she will decrease carbamazepine dose to '400mg'$  BID. She will call me for any concerns of side effects or seizure like activity. We have deferred labs, today, as she had sodium rechecked this morning. She has significant bruising where labs were attempted from both arms. Phenobarbital level normal 09/2020. Will plan to repeat labs at next visit. She will use caution with driving and not drive if she is not feeling well. Adequate hydration, well balanced diet and regular exercise advised. She will follow up with Dr April Manson in 4 weeks for review of case and adjustments in treatment plan as indicated. She verbalizes understanding and agreement with this plan.    No orders of the defined types were placed in this encounter.    Meds ordered this encounter  Medications   levETIRAcetam (KEPPRA) 250 MG tablet    Sig: Take 1 tablet (250 mg total) by mouth 2 (two) times daily.    Dispense:  60 tablet    Refill:  3    Order Specific Question:   Supervising Provider    Answer:   Melvenia Beam [id number]   PHENobarbital (LUMINAL) 64.8 MG tablet    Sig: TAKE 2 TABLETS BY MOUTH AT BEDTIME. GENERIC EQUIVALENT FOR LUMINAL    Dispense:  180 tablet    Refill:  1    Order Specific Question:   Supervising Provider    Answer:   Melvenia Beam [id number]   carbamazepine (TEGRETOL XR) 100 MG 12 hr tablet    Sig: TAKE 4 TABLETS BY MOUTH TWICE DAILY    Dispense:  270 tablet    Refill:  3    Order Specific Question:   Supervising Provider    Answer:   Melvenia Beam [id number], FNP-C 03/07/2021, 4:27 PM [facility] [address], [place], [place] [address] [phone]

## 2021-03-07 ENCOUNTER — Other Ambulatory Visit: Payer: Self-pay

## 2021-03-07 ENCOUNTER — Ambulatory Visit (INDEPENDENT_AMBULATORY_CARE_PROVIDER_SITE_OTHER): Payer: Medicare Other | Admitting: Family Medicine

## 2021-03-07 ENCOUNTER — Encounter: Payer: Self-pay | Admitting: Family Medicine

## 2021-03-07 VITALS — BP 119/64 | HR 76 | Ht 64.0 in | Wt 119.0 lb

## 2021-03-07 DIAGNOSIS — E871 Hypo-osmolality and hyponatremia: Secondary | ICD-10-CM | POA: Diagnosis not present

## 2021-03-07 DIAGNOSIS — R569 Unspecified convulsions: Secondary | ICD-10-CM | POA: Diagnosis not present

## 2021-03-07 DIAGNOSIS — E7800 Pure hypercholesterolemia, unspecified: Secondary | ICD-10-CM | POA: Diagnosis not present

## 2021-03-07 MED ORDER — PHENOBARBITAL 64.8 MG PO TABS: ORAL_TABLET | ORAL | 1 refills | Status: DC

## 2021-03-07 MED ORDER — LEVETIRACETAM 250 MG PO TABS: 250.0000 mg | ORAL_TABLET | Freq: Two times a day (BID) | ORAL | 3 refills | Status: DC

## 2021-03-07 MED ORDER — CARBAMAZEPINE ER 100 MG PO TB12: ORAL_TABLET | ORAL | 3 refills | Status: DC

## 2021-03-07 NOTE — Patient Instructions (Signed)
Below is our plan:  We will continue phenobarbital 2 tablets daily at bedtime and carbamazepine (Tegretol) '500mg'$  twice daily. We are going to start levetiracetam (Keppra) '250mg'$  twice daily. Once you have taken levetiracetam (Keppra) twice daily for 2 weeks, please decrease carbamazepine (Tegretol) to '400mg'$  twice daily (4 tablets)  Please make sure you are staying well hydrated. I recommend 50-60 ounces daily. Well balanced diet and regular exercise encouraged. Consistent sleep schedule with 6-8 hours recommended.   Please continue follow up with care team as directed.   Follow up with Dr April Manson in 4-6 weeks   You may receive a survey regarding today's visit. I encourage you to leave honest feed back as I do use this information to improve patient care. Thank you for seeing me today!

## 2021-03-13 ENCOUNTER — Encounter: Payer: Self-pay | Admitting: *Deleted

## 2021-03-15 ENCOUNTER — Telehealth: Payer: Self-pay | Admitting: Family Medicine

## 2021-03-15 NOTE — Telephone Encounter (Signed)
Pt states levETIRAcetam (KEPPRA) 250 MG tablet causes her to always be in the bathroom. Pt is asking if she can go back on PHENobarbital, even if it causes her high cholesterol.

## 2021-03-18 ENCOUNTER — Telehealth: Payer: Self-pay | Admitting: Family Medicine

## 2021-03-18 NOTE — Telephone Encounter (Signed)
This is a duplicate call from 3 days ago. [facility] and Amy are working on this. See other phone note.

## 2021-03-18 NOTE — Telephone Encounter (Signed)
Pt called and states the levETIRAcetam (KEPPRA) 250 MG tablet is still hurting her stomach. She states she cannot do anything without having to go to the bathroom. She says it upsets her stomach once she eats something. Pt asking if she can be put back on the PHENobarbital (LUMINAL) 64.8 MG tablet. Pt requesting a call back.

## 2021-03-18 NOTE — Telephone Encounter (Signed)
LVM requesting call back to discuss.

## 2021-03-19 NOTE — Telephone Encounter (Signed)
Patient returned call, and I advised her of Amy's instructions regarding medication changes from last office visit. Patient stated she didn't know any of that; she never got those instructions. I advised her they were on her AVS. Currently she is taking  Tegretol 4 tabs twice a day. She stopped phenobarb a few days after office visit and  started levetiracetam 250 mg twice daily. She stated she's not really having diarrhea, going more often which began after beginning levetiracetam. I advised her I;ll let Amy know and call her back with instructions. Patient verbalized understanding, appreciation.'

## 2021-03-19 NOTE — Telephone Encounter (Signed)
Called patient who verified she has been taking keppra 250 mg twice daily and has already decreased tegretol to 400 mg twice daily since office visit. I  had her get paper, pen to write down medication instructions:  Please continue phenobarbitol 2 tabs at night, carbamazepine 400 mg BID, and levetiracetam '250mg'$  BID.  She repeated correctly x 2.  Reminded her of FU 04/09/21, advised she call for any problems, questions. Patient verbalized understanding, appreciation.

## 2021-04-09 ENCOUNTER — Ambulatory Visit (INDEPENDENT_AMBULATORY_CARE_PROVIDER_SITE_OTHER): Payer: Medicare Other | Admitting: Neurology

## 2021-04-09 ENCOUNTER — Encounter: Payer: Self-pay | Admitting: Neurology

## 2021-04-09 VITALS — BP 143/67 | HR 66 | Ht 63.0 in | Wt 123.0 lb

## 2021-04-09 DIAGNOSIS — R569 Unspecified convulsions: Secondary | ICD-10-CM

## 2021-04-09 MED ORDER — PHENOBARBITAL 64.8 MG PO TABS: ORAL_TABLET | ORAL | 4 refills | Status: DC

## 2021-04-09 MED ORDER — CARBAMAZEPINE ER 100 MG PO TB12: 500.0000 mg | ORAL_TABLET | Freq: Two times a day (BID) | ORAL | 4 refills | Status: DC

## 2021-04-09 NOTE — Progress Notes (Addendum)
PATIENT: Brenda Woods DOB: [birth year]  REASON FOR VISIT: follow up HISTORY FROM: patient  Chief Complaint  Patient presents with   Follow-up    Rm 13, alone, here to discuss seizure medications, state she cannot take keppra      HISTORY OF PRESENT ILLNESS:  04/09/2021:  Brenda Woods presents today for follow-up for generalized epilepsy.  She was last seen on September 1 and at that time, plan was to continue with carbamazepine 500 mg twice daily and phenobarbital 129.6 mg at bedtime.  However she did have a lab work by her PMD which showed a sodium of 126.  At that time review of labs show a baseline sodium of 128-131 over the past 5 years but plan was to decrease the carbamazepine to 400 mg twice a day and to start the patient on Keppra 250 mg twice daily.  Patient said that he took the [place], could not tolerate it, took it for about 6 to 7 days but was feeling very jittery anxious therefore she discontinued.  She went back to the original dose of carbamazepine 500 mg twice a day and phenobarbital 129.6 mg.  She reported she is back to her normal self, she has been sprinkling more salt in her food.  Denies any additional side effects since discontinuing the Keppra and no other complaint.  No seizures.     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 She has continued carbamazepine 500mg  BID and phenobarbital 129.6mg  at bedtime since last seizure in 2015. Prior to 2015, she had been on phenobarbital and carbamazepine 400mg  BID for as far back as she can remember (probably since 1990). Last seen 08/2020 and doing well on current regimen. She was seen by PCP 03/04/2021. Labs revealed sodium level of 126. Baseline 128-131 over past 5 years. CBC 3.71, HGB 12. Liver enzymes and GGT normal.    She has tolerated regimen for many years and very hesitant to switch AED. She denies concerns of mentation changes, weakness or unusual fatigue. She lost her husband  and now lives alone. She is driving without difficulty. She is able to manage medications and perform ADLs independently. She does not remember ever taking levetiracetam in the past.   08/08/2020 ALL: She returns for seizure follow up. She continues carbamazepine 500mg  BID and phenobarbital 129.6mg  at bedtime. She is doing very well. No seizure activity. She continues to work. She does not have PCP.   08/08/2019 ALL:  Brenda Woods is a 65 y.o. female here today for follow up for seizure. She continues carbamazepine 500mg  twice daily and phenobarbital 129.6mg  at bedtime (two 64.8mg  tablets). She denies recent seizure activity or missed doses of medication. She denies obvious adverse effects. She is staying well hydrated. She does not have an established PCP. She reports a history of white coat syndrome. She does not check BP at home. She denies chest pain, shortness or breath, dizziness or headaches.   HISTORY: (copied from [employer] note on 08/02/2018)  [name], 65 year old female returns for yearly followup,  with a history of seizure disorder with last seizure occurring in May 2015 and then previously in 1990.  Her last seizure occurred  in the grocery store and was reaching for something when she started having jerking of her extremities. 911 was called. She has no recollection of the events. According to the hospital record she was dehydrated. Her Tegretol dose was increased to 500 mg twice daily. She  is also on phenobarbital.  On return visit today she has not had recurrent seizure activity, she needs refills on her medications and labs. No new  neurologic complaints.  No interval medical issues.  She continues to work full-time.  She continues to drive a car without difficulty    HISTORY: She had a history of seizure disorder that is well controlled on Tegretol and Phenobarbital. Her last seizure occurred in 1990. Denies side effects to her medication. She had been followed [facility] since [facility]  with onset of seizures at age 10. She has had multiple trials of medications. She has no other significant medical problems.    REVIEW OF SYSTEMS: Out of a complete 14 system review of symptoms, the patient complains only of the following symptoms, none and all other reviewed systems are negative.  ALLERGIES: No Known Allergies  HOME MEDICATIONS: Outpatient Medications Prior to Visit  Medication Sig Dispense Refill   carbamazepine (TEGRETOL XR) 100 MG 12 hr tablet TAKE 4 TABLETS BY MOUTH TWICE DAILY 270 tablet 3   PHENobarbital (LUMINAL) 64.8 MG tablet TAKE 2 TABLETS BY MOUTH AT BEDTIME. GENERIC EQUIVALENT FOR LUMINAL 180 tablet 1   levETIRAcetam (KEPPRA) 250 MG tablet Take 1 tablet (250 mg total) by mouth 2 (two) times daily. 60 tablet 3   naproxen sodium (ALEVE) 220 MG tablet Take 220 mg by mouth as needed. OTC     No facility-administered medications prior to visit.    PAST MEDICAL HISTORY: Past Medical History:  Diagnosis Date   Allergy    Arthritis    elbows   Cataract    Osteopenia    Seizure ([place])    last seizure 13, onset age 23    PAST SURGICAL HISTORY: Past Surgical History:  Procedure Laterality Date   ANKLE FRACTURE SURGERY  07-05-08    FAMILY HISTORY: Family History  Problem Relation Age of Onset   Alzheimer's disease Father    Breast cancer Maternal Aunt    Colon cancer Neg Hx    Esophageal cancer Neg Hx    Rectal cancer Neg Hx    Stomach cancer Neg Hx     SOCIAL HISTORY: Social History   Socioeconomic History   Marital status: Married    Spouse name: Legrand Como   Number of children: 0   Years of education: 12   Highest education level: Not on file  Occupational History    Employer: [employer]  Tobacco Use   Smoking status: Never   Smokeless tobacco: Never  Substance and Sexual Activity   Alcohol use: No   Drug use: No   Sexual activity: Not on file  Other Topics Concern   Not on file  Social History Narrative    Patient is married to [place]. 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 Patient has a high school education.   Patient is right-handed.   Patient drinks about 2 glasses of soda daily.   Social Determinants of Health   Financial Resource Strain: Not on file  Food Insecurity: Not on file  Transportation Needs: Not on file  Physical Activity: Not on file  Stress: Not on file  Social Connections: Not on file  Intimate Partner Violence: Not on file    PHYSICAL EXAM  Vitals:   04/09/21 1504  BP: (!) 143/67  Pulse: 66  Weight: 123 lb (55.8 kg)  Height: 5\' 3"  (1.6 m)    Body mass index  is 21.79 kg/m.  Generalized: Well developed, in no acute distress  Neurological examination  Mentation: Alert oriented to time, place, history taking. Follows all commands speech and language fluent Motor: The motor testing reveals 5 over 5 strength of all 4 extremities. Good symmetric motor tone is noted throughout.    DIAGNOSTIC DATA (LABS, IMAGING, TESTING) - I reviewed patient records, labs, notes, testing and imaging myself where available.  No flowsheet data found.   Lab Results  Component Value Date   WBC 3.7 08/08/2020   HGB 11.5 08/08/2020   HCT 32.7 (L) 08/08/2020   MCV 92 08/08/2020   PLT 305 08/08/2020      Component Value Date/Time   NA 129 (L) 08/08/2020 0859   K 4.4 08/08/2020 0859   CL 90 (L) 08/08/2020 0859   CO2 26 08/08/2020 0859   GLUCOSE 88 08/08/2020 0859   GLUCOSE 92 11/06/2013 0520   BUN 12 08/08/2020 0859   CREATININE 0.54 (L) 08/08/2020 0859   CALCIUM 9.2 08/08/2020 0859   PROT 7.0 08/08/2020 0859   ALBUMIN 4.3 08/08/2020 0859   AST 23 08/08/2020 0859   ALT 18 08/08/2020 0859   ALKPHOS 132 (H) 08/08/2020 0859   BILITOT <0.2 08/08/2020 0859   GFRNONAA 99 08/08/2020 0859   GFRAA 114 08/08/2020 0859   No results found for: CHOL, HDL, LDLCALC, LDLDIRECT, TRIG, CHOLHDL No results found for:  HGBA1C No results found for: VITAMINB12 Lab Results  Component Value Date   TSH 2.000 11/06/2013       ASSESSMENT AND PLAN 65 y.o. year old female  has a past medical history of Allergy, Arthritis, Cataract, Osteopenia, and Seizure ([place]). here with     ICD-10-CM   1. Seizure ([place])  R56.9 Carbamazepine level, total    Phenobarbital level    Basic Metabolic Panel    Vitamin D, 25-hydroxy      For her seizure, she had tried levetiracetam 250 mg twice daily after she was found to be hyponatremic to 126, stated that the Levetiracetam made her jittery, anxious and she did not like the feelings therefore she self discontinued.  She is back to her previous regimen of carbamazepine 500 mg twice daily and phenobarbital 129.6 nightly.  She reported she is back to her normal self, denies any additional seizures, denies any additional side effect.  I will check a sodium today, I will check a Tegretol and phenobarb level and also her vitamin D level.  I will contact the patient to discuss the results otherwise she can follow-up in 1 year. Refill given    Plan Continue with Carbamazepine 500 mg BID  Continue with Phenobarb 129.6 mg nightly We will check Carbamazepine, Phenobarb, BMP and Vit D level today  Follow up in 1 year.    Orders Placed This Encounter  Procedures   Carbamazepine level, total   Phenobarbital level   Basic Metabolic Panel   Vitamin D, 25-hydroxy      Meds ordered this encounter  Medications   carbamazepine (TEGRETOL XR) 100 MG 12 hr tablet    Sig: Take 5 tablets (500 mg total) by mouth 2 (two) times daily. TAKE 4 TABLETS BY MOUTH TWICE DAILYTAKE 5 TABLETS BY MOUTH TWICE DAILY    Dispense:  900 tablet    Refill:  4   PHENobarbital (LUMINAL) 64.8 MG tablet    Sig: TAKE 2 TABLETS BY MOUTH AT BEDTIME. GENERIC EQUIVALENT FOR LUMINAL    Dispense:  180 tablet    Refill:  Gunter, MD 04/09/2021, 3:47 PM [facility] [address],  [place], [place] [address] [phone]

## 2021-04-09 NOTE — Patient Instructions (Addendum)
Continue with Carbamazepine 500 mg BID  Continue with Phenobarb 129.6 mg nightly We will check Carbamazepine, Phenobarb, BMP and Vit D level today  Follow up in 1 year.

## 2021-04-11 ENCOUNTER — Other Ambulatory Visit (INDEPENDENT_AMBULATORY_CARE_PROVIDER_SITE_OTHER): Payer: Self-pay

## 2021-04-11 DIAGNOSIS — R569 Unspecified convulsions: Secondary | ICD-10-CM | POA: Diagnosis not present

## 2021-04-11 DIAGNOSIS — Z0289 Encounter for other administrative examinations: Secondary | ICD-10-CM

## 2021-04-12 LAB — BASIC METABOLIC PANEL
BUN/Creatinine Ratio: 19 (ref 12–28)
BUN: 11 mg/dL (ref 8–27)
CO2: 22 mmol/L (ref 20–29)
Calcium: 8.8 mg/dL (ref 8.7–10.3)
Chloride: 91 mmol/L — ABNORMAL LOW (ref 96–106)
Creatinine, Ser: 0.57 mg/dL (ref 0.57–1.00)
Glucose: 113 mg/dL — ABNORMAL HIGH (ref 70–99)
Potassium: 4.2 mmol/L (ref 3.5–5.2)
Sodium: 127 mmol/L — ABNORMAL LOW (ref 134–144)
eGFR: 100 mL/min/{1.73_m2} (ref >59)

## 2021-04-12 LAB — VITAMIN D 25 HYDROXY (VIT D DEFICIENCY, FRACTURES): Vit D, 25-Hydroxy: 15.6 ng/mL — ABNORMAL LOW (ref 30.0–100.0)

## 2021-04-12 LAB — PHENOBARBITAL LEVEL: Phenobarbital, Serum: 26 ug/mL (ref 15–40)

## 2021-04-12 LAB — CARBAMAZEPINE LEVEL, TOTAL: Carbamazepine (Tegretol), S: 8.6 ug/mL (ref 4.0–12.0)

## 2021-04-15 ENCOUNTER — Telehealth: Payer: Self-pay | Admitting: Neurology

## 2021-04-15 MED ORDER — VITAMIN D (ERGOCALCIFEROL) 1.25 MG (50000 UNIT) PO CAPS: 50000.0000 [IU] | ORAL_CAPSULE | ORAL | 0 refills | Status: DC

## 2021-04-15 NOTE — Telephone Encounter (Signed)
Rx for Vit D sent to pharmacy.   Alric Ran, MD

## 2021-04-22 DIAGNOSIS — Z0289 Encounter for other administrative examinations: Secondary | ICD-10-CM

## 2021-04-25 ENCOUNTER — Telehealth: Payer: Self-pay | Admitting: *Deleted

## 2021-04-25 NOTE — Telephone Encounter (Signed)
I fax pt DMV form on 04/25/21 [phone]

## 2021-06-26 DIAGNOSIS — H2513 Age-related nuclear cataract, bilateral: Secondary | ICD-10-CM | POA: Diagnosis not present

## 2021-06-26 DIAGNOSIS — H353132 Nonexudative age-related macular degeneration, bilateral, intermediate dry stage: Secondary | ICD-10-CM | POA: Diagnosis not present

## 2021-06-26 DIAGNOSIS — H1789 Other corneal scars and opacities: Secondary | ICD-10-CM | POA: Diagnosis not present

## 2021-06-26 DIAGNOSIS — H43813 Vitreous degeneration, bilateral: Secondary | ICD-10-CM | POA: Diagnosis not present

## 2021-08-08 ENCOUNTER — Ambulatory Visit: Payer: BC Managed Care – PPO | Admitting: Family Medicine

## 2021-08-09 ENCOUNTER — Other Ambulatory Visit: Payer: Self-pay | Admitting: Family Medicine

## 2021-08-09 DIAGNOSIS — N63 Unspecified lump in unspecified breast: Secondary | ICD-10-CM

## 2021-08-23 ENCOUNTER — Ambulatory Visit
Admission: RE | Admit: 2021-08-23 | Discharge: 2021-08-23 | Disposition: A | Discharge status: Home / Self Care | Payer: Medicare Other | Source: Ambulatory Visit | Attending: Family Medicine | Admitting: Family Medicine

## 2021-08-23 DIAGNOSIS — M8588 Other specified disorders of bone density and structure, other site: Secondary | ICD-10-CM | POA: Diagnosis not present

## 2021-08-23 DIAGNOSIS — E2839 Other primary ovarian failure: Secondary | ICD-10-CM

## 2021-08-23 DIAGNOSIS — M810 Age-related osteoporosis without current pathological fracture: Secondary | ICD-10-CM | POA: Diagnosis not present

## 2021-08-23 DIAGNOSIS — Z780 Asymptomatic menopausal state: Secondary | ICD-10-CM | POA: Diagnosis not present

## 2021-08-27 ENCOUNTER — Ambulatory Visit
Admission: RE | Admit: 2021-08-27 | Discharge: 2021-08-27 | Disposition: A | Discharge status: Home / Self Care | Payer: Medicare Other | Source: Ambulatory Visit | Attending: Family Medicine | Admitting: Family Medicine

## 2021-08-27 DIAGNOSIS — R922 Inconclusive mammogram: Secondary | ICD-10-CM | POA: Diagnosis not present

## 2021-08-27 DIAGNOSIS — N63 Unspecified lump in unspecified breast: Secondary | ICD-10-CM

## 2021-09-05 DIAGNOSIS — M810 Age-related osteoporosis without current pathological fracture: Secondary | ICD-10-CM | POA: Diagnosis not present

## 2021-09-11 ENCOUNTER — Telehealth: Payer: Self-pay | Admitting: Neurology

## 2021-09-11 NOTE — Telephone Encounter (Signed)
Pt would like a call from the nurse. Discuss if the medication Alendronate for bone loss prescribed by PCP would be ok to take with seizure medications. ?

## 2021-09-11 NOTE — Telephone Encounter (Signed)
I spoke to the patient. She is aware of Dr. Jabier Mutton response. ?

## 2021-09-11 NOTE — Telephone Encounter (Signed)
She is on Phenobarbital and Carbamazepine and there are no know interactions between these medications and Alendronate.  ? ?Thanks  ?Dr. April Manson

## 2021-09-17 DIAGNOSIS — L308 Other specified dermatitis: Secondary | ICD-10-CM | POA: Diagnosis not present

## 2021-10-10 ENCOUNTER — Telehealth: Payer: Self-pay | Admitting: Neurology

## 2021-10-10 NOTE — Telephone Encounter (Signed)
I spoke to the patient. This past Tuesday, she had mild itching on her left wrist. The following day, she noticed a reddened, rash area from her left wrist up a quarter way to her elbow. Top of arm only - not underneath. No longer itchy. No further spreading. Not present anywhere else on her body. Denies working out in the yard. She has not changed soap, lotion or other skin care products. Denies any signs/symptoms of illness. She has been on carbamazepine for 10+ years with no recent dosage changes.I told her it is doubtful this is related to her medication. She is well established with a dermatologist. I instructed her to call his office so that the active rash could be further evaluated. If she is not able to be seen today, I told her to take pictures of the area and monitor for any changes. This may also be helpful for her dermatologist. She was in agreement with this plan.  ?

## 2021-10-10 NOTE — Telephone Encounter (Signed)
Totally agree.  Thank you.

## 2021-10-10 NOTE — Telephone Encounter (Signed)
Pt said, have itchy red spots on left wrist to a quarter up my arm. Would like a call from the nurse to discuss side effect to carbamazepine (TEGRETOL XR) 100 MG 12 hr tablet (Expired) ?

## 2021-12-26 ENCOUNTER — Other Ambulatory Visit: Payer: Self-pay | Admitting: Neurology

## 2022-01-14 DIAGNOSIS — K5900 Constipation, unspecified: Secondary | ICD-10-CM | POA: Diagnosis not present

## 2022-01-17 ENCOUNTER — Encounter: Payer: Self-pay | Admitting: Physician Assistant

## 2022-02-10 ENCOUNTER — Other Ambulatory Visit: Payer: Self-pay | Admitting: Neurology

## 2022-02-10 NOTE — Telephone Encounter (Signed)
Verify Drug Registry For Phenobarbital 64.8 Mg Tablet Last Filled: 12/30/2021 Quantity: 180 tablets for 90 days Last appointment: 04/09/2021 Next appointment: 04/14/2022

## 2022-02-12 NOTE — Progress Notes (Signed)
02/14/2022 Brenda Woods [id number] 03-31-1956  Referring provider: Glenis Smoker, * Primary GI doctor: Dr. Silverio Decamp  ASSESSMENT AND PLAN:   Chronic idiopathic constipation 01/17/2019 colonoscopy with Dr. Silverio Decamp for high risk colon cancer surveillance with personal history of colonic polyps difficult exam due to restricted mobility of colon and tortuous colon.  Good bowel prep.  1 mm polyp hepatic flexure nonbleeding hemorrhoids.  Recall 7 to 10 years. Likely due to decrease activity with fosamax and known tortous colon.  No evidence for GI bleeding, iron deficiency anemia. Denies unexplained weight loss.  - Increase fiber/ water intake, decrease caffeine, increase activity level. -Will add on Miralax twice a day and Benefiber - if not better or any additional symptoms will do CT AB and pelvis with contrast  History of colonic polyps 01/17/2019 colonoscopy with Dr. Silverio Decamp for high risk colon cancer surveillance with personal history of colonic polyps difficult exam due to restricted mobility of colon and tortuous colon.  Good bowel prep.  1 mm polyp hepatic flexure nonbleeding hemorrhoids.  Recall 7 to 10 years.  Patient Care Team: Glenis Smoker, MD as PCP - General (Family Medicine) Laverna Peace as Attending Physician  HISTORY OF PRESENT ILLNESS: 66 y.o. female with a past medical history of epilepsy and others listed below presents for evaluation of constipation.   01/17/2019 colonoscopy with Dr. Silverio Decamp for high risk colon cancer surveillance with personal history of colonic polyps difficult exam due to restricted mobility of colon and tortuous colon.  Good bowel prep.  1 mm polyp hepatic flexure nonbleeding hemorrhoids.  Recall 7 to 10 years. She has never had any AB surgeries.  She states she has always had constipation, BM every 2-3 days, have urgency, formed stools and no straining.   Since end of July has had more constipation. Has started  fosamax in last 2-3 months. Had a fall several months ago with ankle injury and with the heat was walking, has not been walking.  Started miralax early July for miralax for a week, 1/2 a capful to 1 and did not help.  No weight loss, no AB pain.  No blood in the stool, no melena.     Current Medications:   Current Outpatient Medications (Endocrine & Metabolic):    alendronate (FOSAMAX) 10 MG tablet, Take 10 mg by mouth daily.      Current Outpatient Medications (Other):    Multiple Vitamins-Minerals (ONE-A-DAY 50 PLUS PO), Take by mouth.   PHENobarbital (LUMINAL) 64.8 MG tablet, TAKE 2 TABLETS BY MOUTH AT BEDTIME   Vitamin D, Ergocalciferol, (DRISDOL) 1.25 MG (50000 UNIT) CAPS capsule, Take 1 capsule (50,000 Units total) by mouth every 7 (seven) days.   carbamazepine (TEGRETOL XR) 100 MG 12 hr tablet, Take 5 tablets (500 mg total) by mouth 2 (two) times daily. TAKE 4 TABLETS BY MOUTH TWICE DAILYTAKE 5 TABLETS BY MOUTH TWICE DAILY  Medical History:  Past Medical History:  Diagnosis Date   Allergy    Arthritis    elbows   Cataract    Osteopenia    Seizure ([place])    last seizure 1991, onset age 43   Seizure ([place])    Allergies: No Known Allergies   Surgical History:  She  has a past surgical history that includes Ankle fracture surgery (07-05-08). Family History:  Her family history includes Alzheimer's disease in her father; Breast cancer in her maternal aunt. Social History:   reports that she has never smoked. She has never used  smokeless tobacco. She reports that she does not drink alcohol and does not use drugs.  REVIEW OF SYSTEMS  : All other systems reviewed and negative except where noted in the History of Present Illness.   PHYSICAL EXAM: BP (!) 142/74   Pulse 62   Ht '5\' 3"'$  (1.6 m)   Wt 119 lb (54 kg)   BMI 21.08 kg/m  General:   Pleasant, well developed female in no acute distress Head:   Normocephalic and atraumatic. Eyes:  sclerae anicteric,conjunctive  pink  Heart:   regular rate and rhythm Pulm:  Clear anteriorly; no wheezing Abdomen:   Soft, Obese AB, Active bowel sounds. No tenderness . Without guarding and Without rebound, No organomegaly appreciated. Rectal: Not evaluated Extremities:  Without edema. Msk: Symmetrical without gross deformities. Peripheral pulses intact.  Neurologic:  Alert and  oriented x4;  No focal deficits.  Skin:   Dry and intact without significant lesions or rashes. Psychiatric:  Cooperative. Normal mood and affect.    Vladimir Crofts, PA-C 8:59 AM

## 2022-02-14 ENCOUNTER — Encounter: Payer: Self-pay | Admitting: Physician Assistant

## 2022-02-14 ENCOUNTER — Ambulatory Visit (INDEPENDENT_AMBULATORY_CARE_PROVIDER_SITE_OTHER): Payer: Medicare Other | Admitting: Physician Assistant

## 2022-02-14 VITALS — BP 142/74 | HR 62 | Ht 63.0 in | Wt 119.0 lb

## 2022-02-14 DIAGNOSIS — Z86010 Personal history of colonic polyps: Secondary | ICD-10-CM

## 2022-02-14 DIAGNOSIS — K5904 Chronic idiopathic constipation: Secondary | ICD-10-CM

## 2022-02-14 NOTE — Patient Instructions (Addendum)
_______________________________________________________  If you are age 68 or older, your body mass index should be between 23-30. Your Body mass index is 21.08 kg/m. If this is out of the aforementioned range listed, please consider follow up with your Primary Care Provider.  If you are age 53 or younger, your body mass index should be between 19-25. Your Body mass index is 21.08 kg/m. If this is out of the aformentioned range listed, please consider follow up with your Primary Care Provider.   ________________________________________________________  The [facility] providers would like to encourage you to use [facility] to communicate with providers for non-urgent requests or questions.  Due to long hold times on the telephone, sending your provider a message by [facility] may be a faster and more efficient way to get a response.  Please allow 48 business hours for a response.  Please remember that this is for non-urgent requests.  _______________________________________________________  Dennis Bast have been scheduled for an appointment with Dr. Silverio Decamp on 04-24-2022 at 35am . Please arrive 10 minutes early for your appointment.  Miralax is an osmotic laxative.  It only brings more water into the stool.  This is safe to take daily.  Can take up to 17 gram of miralax twice a day.  Mix with juice or coffee.  Start 1 capful at night for 3-4 days and reassess your response in 3-4 days.  You can increase and decrease the dose based on your response.  Remember, it can take up to 3-4 days to take effect OR for the effects to wear off.   I often pair this with benefiber in the morning to help assure the stool is not too loose.   Recommend starting on a fiber supplement, can try metamucil first but if this causes gas/bloating switch to benefiber or citracel, these do not cause gas.  Take with fiber with with a full 8 oz glass of water once a day. This can take 1 month to start helping, so try for at least  one month.  Recommend increasing water and physical activity.   - Drink at least 64-80 ounces of water/liquid per day. - Establish a time to try to move your bowels every day.  For many people, this is after a cup of coffee or after a meal such as breakfast. - Sit all of the way back on the toilet keeping your back fairly straight and while sitting up, try to rest the tops of your forearms on your upper thighs.   - Raising your feet with a step stool/squatty potty can be helpful to improve the angle that allows your stool to pass through the rectum. - Relax the rectum feeling it bulge toward the toilet water.  If you feel your rectum raising toward your body, you are contracting rather than relaxing. - Breathe in and slowly exhale. "Belly breath" by expanding your belly towards your belly button. Keep belly expanded as you gently direct pressure down and back to the anus.  A low pitched GRRR sound can assist with increasing intra-abdominal pressure.  - Repeat 3-4 times. If unsuccessful, contract the pelvic floor to restore normal tone and get off the toilet.  Avoid excessive straining. - To reduce excessive wiping by teaching your anus to normally contract, place hands on outer aspect of knees and resist knee movement outward.  Hold 5-10 second then place hands just inside of knees and resist inward movement of knees.  Hold 5 seconds.  Repeat a few times each way.  Go  to the ER if unable to pass gas, severe AB pain, unable to hold down food, any shortness of breath of chest pain.  It was a pleasure to see you today!  Thank you for trusting me with your gastrointestinal care!

## 2022-02-17 ENCOUNTER — Telehealth: Payer: Self-pay | Admitting: Physician Assistant

## 2022-02-17 DIAGNOSIS — R109 Unspecified abdominal pain: Secondary | ICD-10-CM

## 2022-02-17 DIAGNOSIS — K5904 Chronic idiopathic constipation: Secondary | ICD-10-CM

## 2022-02-17 DIAGNOSIS — R197 Diarrhea, unspecified: Secondary | ICD-10-CM

## 2022-02-17 NOTE — Telephone Encounter (Signed)
Inbound call from patient stating that she had an appointment on 8/11 with Vicie Mutters an she has not gotten any better. Patient stated she is scheduled to see Dr. Silverio Decamp on 10/19 and was wondering if she could be seen sooner. I advised patient that we did not have anything sooner but I could send a message to see if there was any advise we could give her between now and then. Please advise.

## 2022-02-17 NOTE — Telephone Encounter (Signed)
Patient called in with complaints of diarrhea & fecal incontinence post meals that started in May of this year. She was seen in [place] on 02/14/22 for constipation, and was advised to take miralax & benefiber, however she states she doesn't have constipation and that it is diarrhea. She says it happens with anything that she eats, and she typically avoids eating in restaurants due to the urgency/incontinence. Pt has follow up with Dr. Silverio Decamp on 04/24/22, but seeking further recommendations until then.

## 2022-02-18 ENCOUNTER — Other Ambulatory Visit: Payer: Self-pay

## 2022-02-18 ENCOUNTER — Other Ambulatory Visit (INDEPENDENT_AMBULATORY_CARE_PROVIDER_SITE_OTHER): Payer: Medicare Other

## 2022-02-18 ENCOUNTER — Ambulatory Visit (INDEPENDENT_AMBULATORY_CARE_PROVIDER_SITE_OTHER)
Admission: RE | Admit: 2022-02-18 | Discharge: 2022-02-18 | Disposition: A | Discharge status: Home / Self Care | Payer: Medicare Other | Source: Ambulatory Visit | Attending: Physician Assistant | Admitting: Physician Assistant

## 2022-02-18 DIAGNOSIS — R197 Diarrhea, unspecified: Secondary | ICD-10-CM

## 2022-02-18 DIAGNOSIS — K5904 Chronic idiopathic constipation: Secondary | ICD-10-CM

## 2022-02-18 DIAGNOSIS — R109 Unspecified abdominal pain: Secondary | ICD-10-CM | POA: Diagnosis not present

## 2022-02-18 DIAGNOSIS — D649 Anemia, unspecified: Secondary | ICD-10-CM

## 2022-02-18 LAB — COMPREHENSIVE METABOLIC PANEL
ALT: 12 U/L (ref 0–35)
AST: 21 U/L (ref 0–37)
Albumin: 4.1 g/dL (ref 3.5–5.2)
Alkaline Phosphatase: 100 U/L (ref 39–117)
BUN: 14 mg/dL (ref 6–23)
CO2: 24 mEq/L (ref 19–32)
Calcium: 8.7 mg/dL (ref 8.4–10.5)
Chloride: 92 mEq/L — ABNORMAL LOW (ref 96–112)
Creatinine, Ser: 0.52 mg/dL (ref 0.40–1.20)
GFR: 95.47 mL/min (ref >60.00)
Glucose, Bld: 86 mg/dL (ref 70–99)
Potassium: 4 mEq/L (ref 3.5–5.1)
Sodium: 125 mEq/L — ABNORMAL LOW (ref 135–145)
Total Bilirubin: 0.3 mg/dL (ref 0.2–1.2)
Total Protein: 7 g/dL (ref 6.0–8.3)

## 2022-02-18 LAB — CBC WITH DIFFERENTIAL/PLATELET
Basophils Absolute: 0 10*3/uL (ref 0.0–0.1)
Basophils Relative: 0.3 % (ref 0.0–3.0)
Eosinophils Absolute: 0 10*3/uL (ref 0.0–0.7)
Eosinophils Relative: 0 % (ref 0.0–5.0)
HCT: 33.1 % — ABNORMAL LOW (ref 36.0–46.0)
Hemoglobin: 11.4 g/dL — ABNORMAL LOW (ref 12.0–15.0)
Lymphocytes Relative: 47.2 % — ABNORMAL HIGH (ref 12.0–46.0)
Lymphs Abs: 1.7 10*3/uL (ref 0.7–4.0)
MCHC: 34.4 g/dL (ref 30.0–36.0)
MCV: 98.7 fl (ref 78.0–100.0)
Monocytes Absolute: 0.4 10*3/uL (ref 0.1–1.0)
Monocytes Relative: 12.6 % — ABNORMAL HIGH (ref 3.0–12.0)
Neutro Abs: 1.4 10*3/uL (ref 1.4–7.7)
Neutrophils Relative %: 39.9 % — ABNORMAL LOW (ref 43.0–77.0)
Platelets: 247 10*3/uL (ref 150.0–400.0)
RBC: 3.36 Mil/uL — ABNORMAL LOW (ref 3.87–5.11)
RDW: 13 % (ref 11.5–15.5)
WBC: 3.5 10*3/uL — ABNORMAL LOW (ref 4.0–10.5)

## 2022-02-18 NOTE — Telephone Encounter (Signed)
Spoke with pt and she reports she has not taken miralax for a long time. She is aware of recommendations and will come for the labs and xray. Orders in epic.

## 2022-02-19 ENCOUNTER — Other Ambulatory Visit (INDEPENDENT_AMBULATORY_CARE_PROVIDER_SITE_OTHER): Payer: Medicare Other

## 2022-02-19 ENCOUNTER — Other Ambulatory Visit: Payer: Self-pay

## 2022-02-19 DIAGNOSIS — D649 Anemia, unspecified: Secondary | ICD-10-CM

## 2022-02-19 DIAGNOSIS — E871 Hypo-osmolality and hyponatremia: Secondary | ICD-10-CM

## 2022-02-19 LAB — IRON: Iron: 78 ug/dL (ref 42–145)

## 2022-02-19 LAB — FERRITIN: Ferritin: 77.6 ng/mL (ref 10.0–291.0)

## 2022-02-19 NOTE — Progress Notes (Unsigned)
N

## 2022-02-20 ENCOUNTER — Other Ambulatory Visit: Payer: Self-pay

## 2022-02-20 DIAGNOSIS — R9389 Abnormal findings on diagnostic imaging of other specified body structures: Secondary | ICD-10-CM

## 2022-02-21 ENCOUNTER — Ambulatory Visit (INDEPENDENT_AMBULATORY_CARE_PROVIDER_SITE_OTHER)
Admission: RE | Admit: 2022-02-21 | Discharge: 2022-02-21 | Disposition: A | Discharge status: Home / Self Care | Payer: Medicare Other | Source: Ambulatory Visit | Attending: Physician Assistant | Admitting: Physician Assistant

## 2022-02-21 ENCOUNTER — Other Ambulatory Visit: Payer: Self-pay

## 2022-02-21 ENCOUNTER — Telehealth: Payer: Self-pay

## 2022-02-21 DIAGNOSIS — R9389 Abnormal findings on diagnostic imaging of other specified body structures: Secondary | ICD-10-CM

## 2022-02-21 NOTE — Telephone Encounter (Signed)
Spoke with patient regarding results & recommendations from recent KUB. She has been advised that [place], [place] recommends she have a lateral view XR, and if not then a CT scan. Pt would like to proceed with CT scan. Orders are already in & she has been provided scheduling number so that she arrange date that best fits her schedule, as well as location. Pt advised to call back with any questions.

## 2022-02-21 NOTE — Telephone Encounter (Signed)
Inbound call from patient returning call. Please give a call back to further advise.  Thank you 

## 2022-02-21 NOTE — Telephone Encounter (Signed)
Patient called in stating she feels much better after having a large bowel movement this morning, and would like to know if KUB is still recommended. Pt advised to still have KUB done today given the "metallic densities" seen on previous KUB. Pt verbalized all understanding.

## 2022-02-25 ENCOUNTER — Telehealth: Payer: Self-pay | Admitting: Physician Assistant

## 2022-02-25 NOTE — Telephone Encounter (Signed)
Spoke with patient & CT bottles placed at 2nd floor front desk for patient to pick up this week. She plans to come on Thursday.

## 2022-02-25 NOTE — Telephone Encounter (Signed)
Wants to know if she can come pick up the contrast for the CT on Thursday and needs clarification about the instructions.  Please call patient and advise.  Thank you.

## 2022-02-27 ENCOUNTER — Ambulatory Visit (HOSPITAL_COMMUNITY): Payer: PRIVATE HEALTH INSURANCE

## 2022-03-04 ENCOUNTER — Encounter (HOSPITAL_COMMUNITY): Payer: Self-pay

## 2022-03-04 ENCOUNTER — Ambulatory Visit (HOSPITAL_COMMUNITY)
Admission: RE | Admit: 2022-03-04 | Discharge: 2022-03-04 | Disposition: A | Discharge status: Home / Self Care | Payer: Medicare Other | Source: Ambulatory Visit | Attending: Physician Assistant | Admitting: Physician Assistant

## 2022-03-04 DIAGNOSIS — R109 Unspecified abdominal pain: Secondary | ICD-10-CM | POA: Insufficient documentation

## 2022-03-04 MED ORDER — SODIUM CHLORIDE (PF) 0.9 % IJ SOLN
INTRAMUSCULAR | Status: AC
  Filled 2022-03-04: qty 50

## 2022-03-04 MED ORDER — IOHEXOL 300 MG/ML  SOLN
100.0000 mL | Freq: Once | INTRAMUSCULAR | Status: AC | PRN
  Administered 2022-03-04: 100 mL via INTRAVENOUS

## 2022-03-05 ENCOUNTER — Telehealth: Payer: Self-pay | Admitting: Physician Assistant

## 2022-03-05 DIAGNOSIS — K5904 Chronic idiopathic constipation: Secondary | ICD-10-CM

## 2022-03-05 DIAGNOSIS — K5641 Fecal impaction: Secondary | ICD-10-CM

## 2022-03-05 MED ORDER — PEG 3350-KCL-NA BICARB-NACL 420 G PO SOLR: ORAL | 0 refills | Status: DC

## 2022-03-05 NOTE — Telephone Encounter (Signed)
Sent inTrilyte- take as directed- but do 6 oz every 30 mins until the impaction passes, can do 1/2 gallon on first day and 1/2 gallon on 2nd day.  Can continue 2 fleets enemas a day, for 2 days until it resolves.  Go to ER if any severe AB pain, vomiting/unable to hold down food/drink, blood in stool, or unable to pass gas.  Please recheck 2 view AB xray next week, Wednesday or Thursday.   Can pick up linzess samples, 290 mcg daily- please put up front for patient. Take at least 30 minutes before the first meal of the day on an empty stomach You can have a loose stool if you eat a high-fat breakfast. Give it at least 4 days, may have more bowel movements during that time.  If you continue to have severe diarrhea try every other day, if you get AB pain with it stop and call our office.   Denies any gallbladder issues.  Get RUQ [place] if any symptoms in the future, discussed with patient.   Shows aortic atherosclerosis, this is plaque build up on the large vessel that runs from your heart down the length of your body, this is a warning sign, suggest control blood pressure, cholesterol, sugars, increase exercise. Can discuss with PCP.

## 2022-03-06 NOTE — Telephone Encounter (Signed)
Samples & written copy of instructions that we discussed are at 2nd floor front desk.

## 2022-03-06 NOTE — Telephone Encounter (Signed)
Patient called back confused on how she was getting her samples. I told her she can pick them up here on 2nd floor. She states she is coming this afternoon to pick up.

## 2022-03-06 NOTE — Telephone Encounter (Signed)
Called patient & went over all instructions. She was able to write down & verbalize the instructions back to me. Linzess samples placed at 2nd floor front desk. She plans to pick the samples up when she comes for her XR next week. Order previously placed. Patient advised to call back with any further questions.

## 2022-03-12 ENCOUNTER — Ambulatory Visit (INDEPENDENT_AMBULATORY_CARE_PROVIDER_SITE_OTHER)
Admission: RE | Admit: 2022-03-12 | Discharge: 2022-03-12 | Disposition: A | Discharge status: Home / Self Care | Payer: PRIVATE HEALTH INSURANCE | Source: Ambulatory Visit | Attending: Physician Assistant | Admitting: Physician Assistant

## 2022-03-12 DIAGNOSIS — K5641 Fecal impaction: Secondary | ICD-10-CM | POA: Diagnosis not present

## 2022-03-12 DIAGNOSIS — K5904 Chronic idiopathic constipation: Secondary | ICD-10-CM | POA: Diagnosis not present

## 2022-03-17 NOTE — Telephone Encounter (Signed)
Patient called said she finished the first bottle of Linzess would like to know if she should proceed with taking the second bottle she was given.

## 2022-03-17 NOTE — Telephone Encounter (Signed)
Discussed with pt that she can take the other bottle of samples, there are only 4 capsules in each bottle.

## 2022-04-14 ENCOUNTER — Encounter: Payer: Self-pay | Admitting: Neurology

## 2022-04-14 ENCOUNTER — Ambulatory Visit (INDEPENDENT_AMBULATORY_CARE_PROVIDER_SITE_OTHER): Payer: Medicare Other | Admitting: Neurology

## 2022-04-14 VITALS — BP 161/72 | HR 69 | Ht 63.0 in | Wt 127.0 lb

## 2022-04-14 DIAGNOSIS — G40909 Epilepsy, unspecified, not intractable, without status epilepticus: Secondary | ICD-10-CM

## 2022-04-14 MED ORDER — CARBAMAZEPINE ER 100 MG PO TB12: 500.0000 mg | ORAL_TABLET | Freq: Two times a day (BID) | ORAL | 4 refills | Status: DC

## 2022-04-14 NOTE — Patient Instructions (Signed)
Continue with Carbamazepine 500 mg BID  Continue with Phenobarb 129.6 mg nightly At next visit, we will obtain lab work  In the meantime, if there is worsening of Na level, will recommend starting salt tab Follow up in 1 year.

## 2022-04-14 NOTE — Progress Notes (Signed)
PATIENT: Brenda Woods Start DOB: 1955-08-14  REASON FOR VISIT: follow up HISTORY FROM: patient  Chief Complaint  Patient presents with   Follow-up    Rm 12, alone Doing well, reports no seizures     HISTORY OF PRESENT ILLNESS:  04/14/2022:  Patient presents today for follow-up, last visit was in October 2022.  Since then she has not had any additional seizures.  She reported compliance with carbamazepine and phenobarbital.  Denies any side effect from the medication.  No concerns.  Overall she is doing well   04/09/2021:  Brenda Woods presents today for follow-up for generalized epilepsy.  She was last seen on September 1 and at that time, plan was to continue with carbamazepine 500 mg twice daily and phenobarbital 129.6 mg at bedtime.  However she did have a lab work by her PMD which showed a sodium of 126.  At that time review of labs show a baseline sodium of 128-131 over the past 5 years but plan was to decrease the carbamazepine to 400 mg twice a day and to start the patient on Keppra 250 mg twice daily.  Patient said that he took the [place], could not tolerate it, took it for about 6 to 7 days but was feeling very jittery anxious therefore she discontinued.  She went back to the original dose of carbamazepine 500 mg twice a day and phenobarbital 129.6 mg.  She reported she is back to her normal self, she has been sprinkling more salt in her food.  Denies any additional side effects since discontinuing the Keppra and no other complaint.  No seizures.     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 She has continued carbamazepine '500mg'$  BID and phenobarbital 129.'6mg'$  at bedtime since last seizure in 2015. Prior to 2015, she had been on phenobarbital and carbamazepine '400mg'$  BID for as far back as she can remember (probably since 1990). Last seen 08/2020 and doing well on current regimen. She was seen by PCP 03/04/2021. Labs revealed sodium level of 126.  Baseline 128-131 over past 5 years. CBC 3.71, HGB 12. Liver enzymes and GGT normal.    She has tolerated regimen for many years and very hesitant to switch AED. She denies concerns of mentation changes, weakness or unusual fatigue. She lost her husband and now lives alone. She is driving without difficulty. She is able to manage medications and perform ADLs independently. She does not remember ever taking levetiracetam in the past.   08/08/2020 ALL: She returns for seizure follow up. She continues carbamazepine '500mg'$  BID and phenobarbital 129.'6mg'$  at bedtime. She is doing very well. No seizure activity. She continues to work. She does not have PCP.   08/08/2019 ALL:  Brenda Woods is a 66 y.o. female here today for follow up for seizure. She continues carbamazepine '500mg'$  twice daily and phenobarbital 129.'6mg'$  at bedtime (two 64.'8mg'$  tablets). She denies recent seizure activity or missed doses of medication. She denies obvious adverse effects. She is staying well hydrated. She does not have an established PCP. She reports a history of white coat syndrome. She does not check BP at home. She denies chest pain, shortness or breath, dizziness or headaches.   HISTORY: (copied from [employer] note on 08/02/2018)  Brenda Woods, 66 year old female returns for yearly followup,  with a history of seizure disorder with last seizure occurring in May 2015 and then previously in 1990.  Her last seizure occurred  in the  grocery store and was reaching for something when she started having jerking of her extremities. 911 was called. She has no recollection of the events. According to the hospital record she was dehydrated. Her Tegretol dose was increased to 500 mg twice daily. She is also on phenobarbital.  On return visit today she has not had recurrent seizure activity, she needs refills on her medications and labs. No new  neurologic complaints.  No interval medical issues.  She continues to work full-time. She continues  to drive a car without difficulty   HISTORY: She had a history of seizure disorder that is well controlled on Tegretol and Phenobarbital. Her last seizure occurred in 1990. Denies side effects to her medication. She had been followed at [facility] since 1968 with onset of seizures at age 12. She has had multiple trials of medications. She has no other significant medical problems.    REVIEW OF SYSTEMS: Out of a complete 14 system review of symptoms, the patient complains only of the following symptoms, none and all other reviewed systems are negative.  ALLERGIES: No Known Allergies  HOME MEDICATIONS: Outpatient Medications Prior to Visit  Medication Sig Dispense Refill   alendronate (FOSAMAX) 10 MG tablet Take 10 mg by mouth daily.     Multiple Vitamins-Minerals (ONE-A-DAY 50 PLUS PO) Take by mouth.     PHENobarbital (LUMINAL) 64.8 MG tablet TAKE 2 TABLETS BY MOUTH AT BEDTIME 180 tablet 3   polyethylene glycol-electrolytes (NULYTELY) 420 g solution take as directed- take 6 oz every 30 mins until the impaction passes, can do 1/2 gallon on first day and 1/2 gallon on 2nd day. 4000 mL 0   Vitamin D, Ergocalciferol, (DRISDOL) 1.25 MG (50000 UNIT) CAPS capsule Take 1 capsule (50,000 Units total) by mouth every 7 (seven) days. 8 capsule 0   carbamazepine (TEGRETOL XR) 100 MG 12 hr tablet Take 5 tablets (500 mg total) by mouth 2 (two) times daily. TAKE 4 TABLETS BY MOUTH TWICE DAILYTAKE 5 TABLETS BY MOUTH TWICE DAILY 900 tablet 4   No facility-administered medications prior to visit.    PAST MEDICAL HISTORY: Past Medical History:  Diagnosis Date   Allergy    Arthritis    elbows   Cataract    Osteopenia    Seizure ([place])    last seizure 81, onset age 64   Seizure ([place])     PAST SURGICAL HISTORY: Past Surgical History:  Procedure Laterality Date   ANKLE FRACTURE SURGERY  07-05-08    FAMILY HISTORY: Family History  Problem Relation Age of Onset   Alzheimer's disease Father    Breast  cancer Maternal Aunt    Colon cancer Neg Hx    Esophageal cancer Neg Hx    Rectal cancer Neg Hx    Stomach cancer Neg Hx     SOCIAL HISTORY: Social History   Socioeconomic History   Marital status: Married    Spouse name: Legrand Como   Number of children: 0   Years of education: 12   Highest education level: Not on file  Occupational History    Employer: [employer]  Tobacco Use   Smoking status: Never   Smokeless tobacco: Never  Vaping Use   Vaping Use: Never used  Substance and Sexual Activity   Alcohol use: No   Drug use: No   Sexual activity: Not on file  Other Topics Concern   Not on file  Social History Narrative   Patient is married to [place]. 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 Patient has a high school education.   Patient is right-handed.   Patient drinks about 2 glasses of soda daily.   Social Determinants of Health   Financial Resource Strain: Not on file  Food Insecurity: Not on file  Transportation Needs: Not on file  Physical Activity: Not on file  Stress: Not on file  Social Connections: Not on file  Intimate Partner Violence: Not on file    PHYSICAL EXAM  Vitals:   04/14/22 1446  BP: (!) 161/72  Pulse: 69  Weight: 127 lb (57.6 kg)  Height: '5\' 3"'$  (1.6 m)     Body mass index is 22.5 kg/m.  Generalized: Well developed, in no acute distress  Neurological examination  Mentation: Alert oriented to time, place, history taking. Follows all commands speech and language fluent Motor: The motor testing reveals 5 over 5 strength of all 4 extremities. Good symmetric motor tone is noted throughout.    DIAGNOSTIC DATA (LABS, IMAGING, TESTING) - I reviewed patient records, labs, notes, testing and imaging myself where available.      No data to display           Lab Results  Component Value Date   WBC 3.5 (L) 02/18/2022   HGB 11.4 (L) 02/18/2022   HCT 33.1 (L)  02/18/2022   MCV 98.7 02/18/2022   PLT 247.0 02/18/2022      Component Value Date/Time   NA 125 (L) 02/18/2022 1218   NA 127 (L) 04/11/2021 1415   K 4.0 02/18/2022 1218   CL 92 (L) 02/18/2022 1218   CO2 24 02/18/2022 1218   GLUCOSE 86 02/18/2022 1218   BUN 14 02/18/2022 1218   BUN 11 04/11/2021 1415   CREATININE 0.52 02/18/2022 1218   CALCIUM 8.7 02/18/2022 1218   PROT 7.0 02/18/2022 1218   PROT 7.0 08/08/2020 0859   ALBUMIN 4.1 02/18/2022 1218   ALBUMIN 4.3 08/08/2020 0859   AST 21 02/18/2022 1218   ALT 12 02/18/2022 1218   ALKPHOS 100 02/18/2022 1218   BILITOT 0.3 02/18/2022 1218   BILITOT <0.2 08/08/2020 0859   GFRNONAA 99 08/08/2020 0859   GFRAA 114 08/08/2020 0859   No results found for: "CHOL", "HDL", "LDLCALC", "LDLDIRECT", "TRIG", "CHOLHDL" No results found for: "HGBA1C" No results found for: "VITAMINB12" Lab Results  Component Value Date   TSH 2.000 11/06/2013       ASSESSMENT AND PLAN 66 y.o. year old female  has a past medical history of Allergy, Arthritis, Cataract, Osteopenia, Seizure ([place]), and Seizure ([place]). here with     ICD-10-CM   1. Seizure disorder ([place])  G40.909       Overall doing well, no seizures, no side effects from the medication. Last sodium level in August 125, baseline 128-131. At this time, will continue current medication, if there is worsening of Na level, will recommend starting salt tab.    Plan Continue with Carbamazepine 500 mg BID  Continue with Phenobarb 129.6 mg nightly At next visit, we will obtain lab work  In the meantime, if there is worsening of Na level, will recommend starting salt tab Follow up in 1 year.    No orders of the defined types were placed in this encounter.     Meds ordered this encounter  Medications   carbamazepine (TEGRETOL XR) 100 MG 12 hr tablet    Sig: Take 5 tablets (500 mg total) by mouth 2 (  two) times daily. TAKE 5 TABLETS BY MOUTH TWICE DAILY    Dispense:  900 tablet    Refill:  4       Alric Ran, MD 04/14/2022, 3:09 PM [facility] [address], [place], [place] [address] [phone]

## 2022-04-24 ENCOUNTER — Ambulatory Visit: Payer: PRIVATE HEALTH INSURANCE | Admitting: Gastroenterology

## 2022-09-22 ENCOUNTER — Other Ambulatory Visit: Payer: Self-pay | Admitting: Neurology

## 2022-09-29 ENCOUNTER — Telehealth: Payer: Self-pay | Admitting: Neurology

## 2022-09-29 MED ORDER — CARBAMAZEPINE ER 100 MG PO TB12
500.0000 mg | ORAL_TABLET | Freq: Two times a day (BID) | ORAL | 1 refills | Status: DC
Start: 2022-09-29 — End: 2023-10-13

## 2022-09-29 NOTE — Telephone Encounter (Signed)
Pt called needing a refill on her PHENobarbital (LUMINAL) 64.8 MG tablet and the carbamazepine (TEGRETOL XR) 100 MG 12 hr tablet sent to the [facility] on [address]

## 2022-09-29 NOTE — Telephone Encounter (Signed)
Received a refill request from [facility] on [address] which is where the patient has been getting it filled. I have forwarded the tegretol refill to the [facility] and then the phenobarbitol is due to be filled. I have routed this request to Dr April Manson for review. The pt is due for the medication and [place] registry was verified.

## 2022-09-29 NOTE — Telephone Encounter (Signed)
This encounter was created in error - please disregard.

## 2022-10-29 ENCOUNTER — Other Ambulatory Visit: Payer: Self-pay

## 2022-11-03 ENCOUNTER — Ambulatory Visit: Payer: Medicare Other | Admitting: Family Medicine

## 2022-11-03 DIAGNOSIS — Z7689 Persons encountering health services in other specified circumstances: Secondary | ICD-10-CM

## 2022-11-24 ENCOUNTER — Ambulatory Visit: Payer: Medicare Other | Admitting: Family Medicine

## 2022-12-01 IMAGING — MG DIGITAL DIAGNOSTIC BILAT W/ TOMO W/ CAD
6 of 10 series · 6 of 30 positions shown · non-contrast
Comparison: Previous exam(s).

CLINICAL DATA: 1+ year interval follow-up of a likely benign mass
involving the UPPER INNER QUADRANT of the LEFT breast at the 10
o'clock position 5 cm from the nipple, likely a complicated cyst.
Annual evaluation, RIGHT breast.

EXAM:
DIGITAL DIAGNOSTIC BILATERAL MAMMOGRAM WITH TOMOSYNTHESIS AND CAD;
ULTRASOUND LEFT BREAST LIMITED
TECHNIQUE: Bilateral digital diagnostic mammography and breast tomosynthesis
was performed. The images were evaluated with computer-aided
detection.; Targeted ultrasound examination of the left breast was
performed.

[L MLO synth-2D]
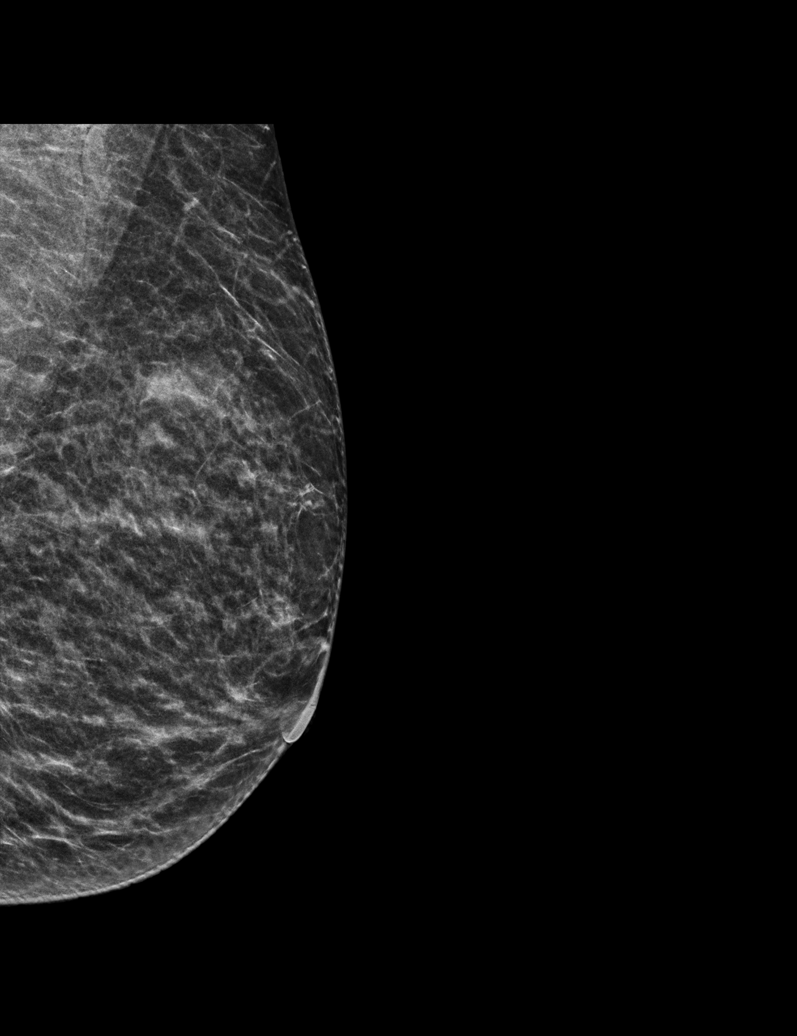

[L CC synth-2D (1 of 2)]
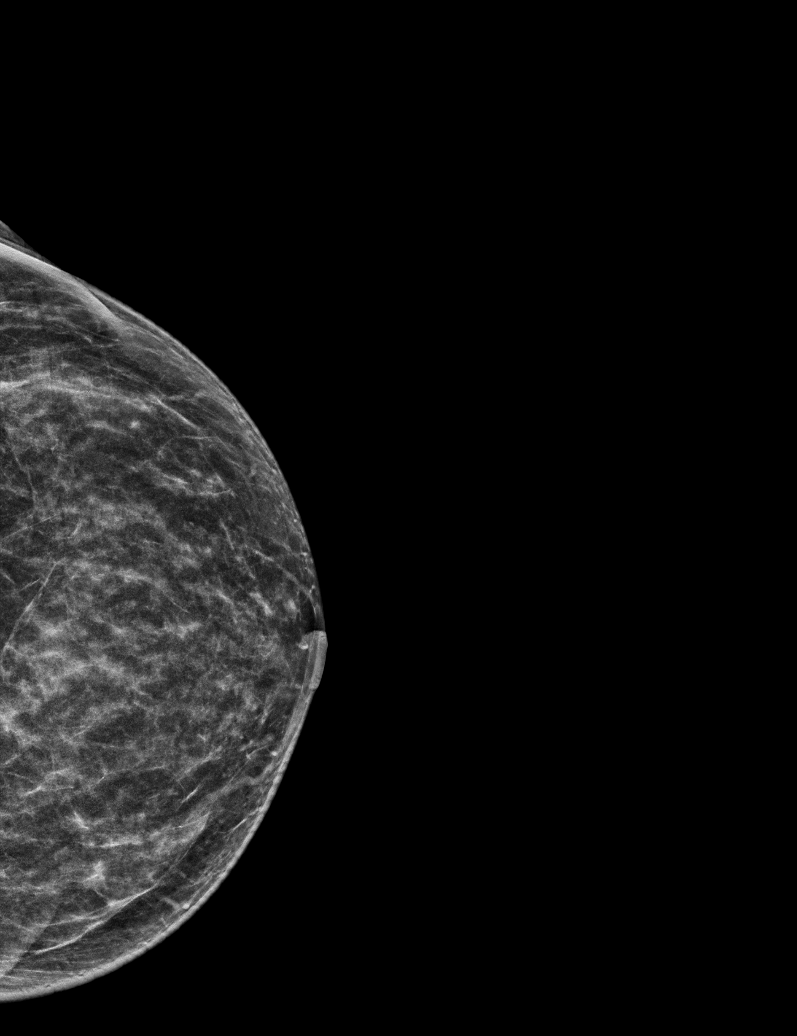

[R CC synth-2D]
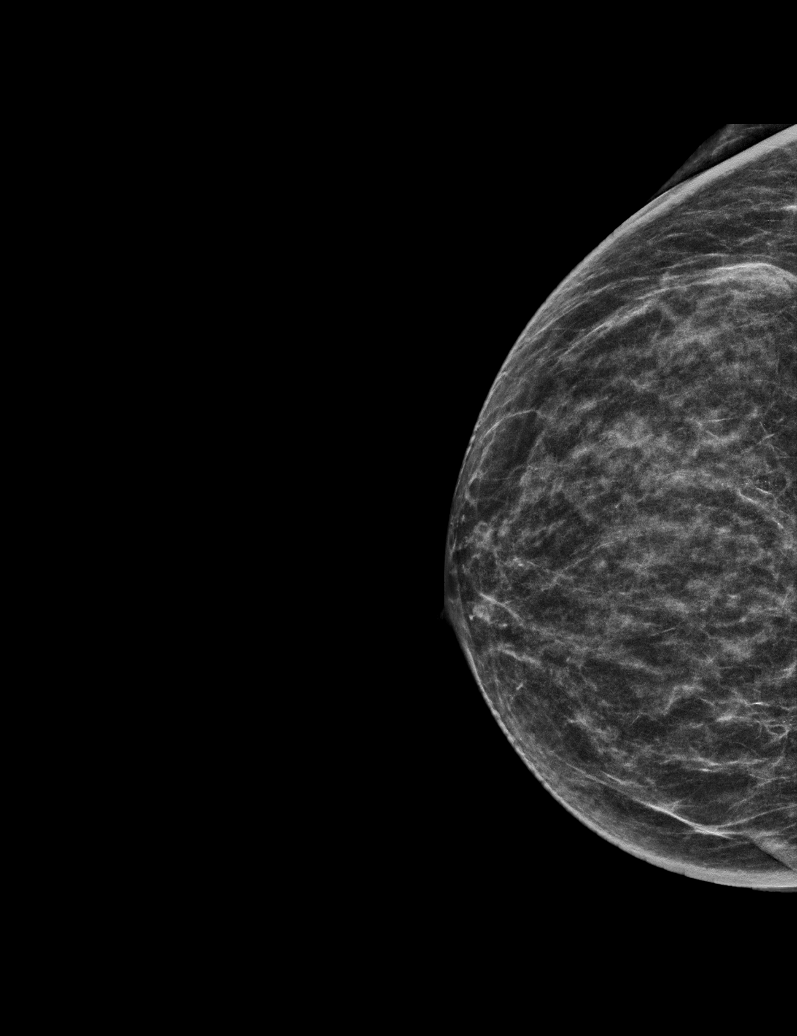

[L CC synth-2D (2 of 2)]
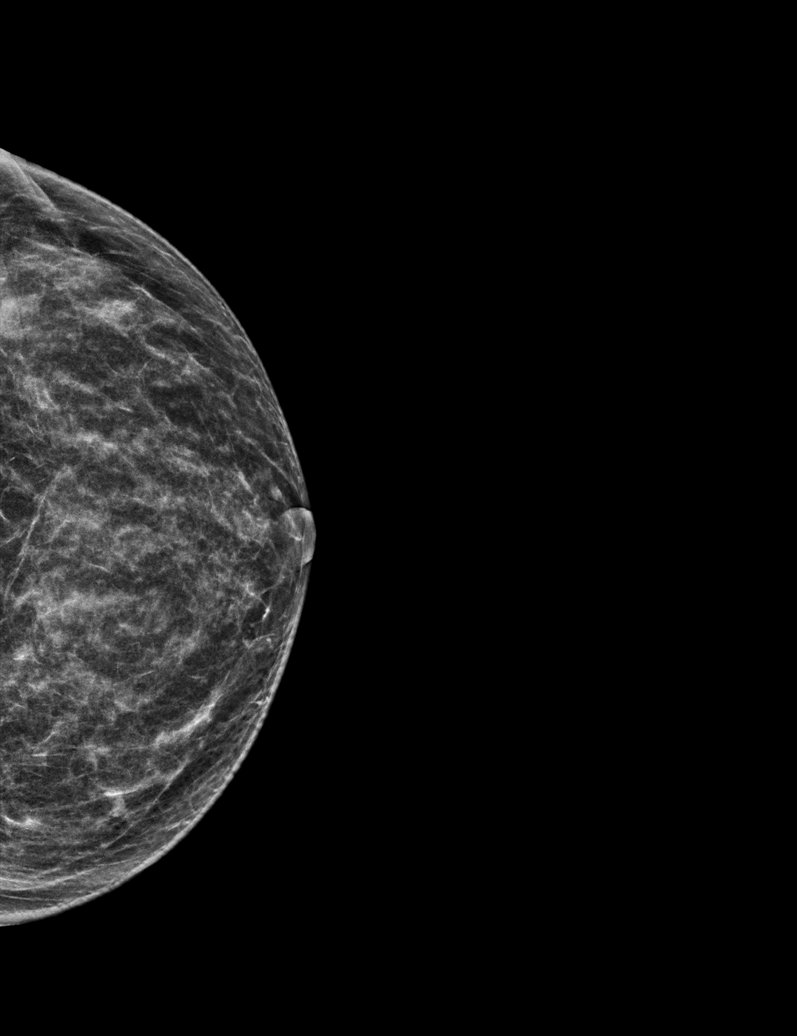

[R MLO synth-2D]
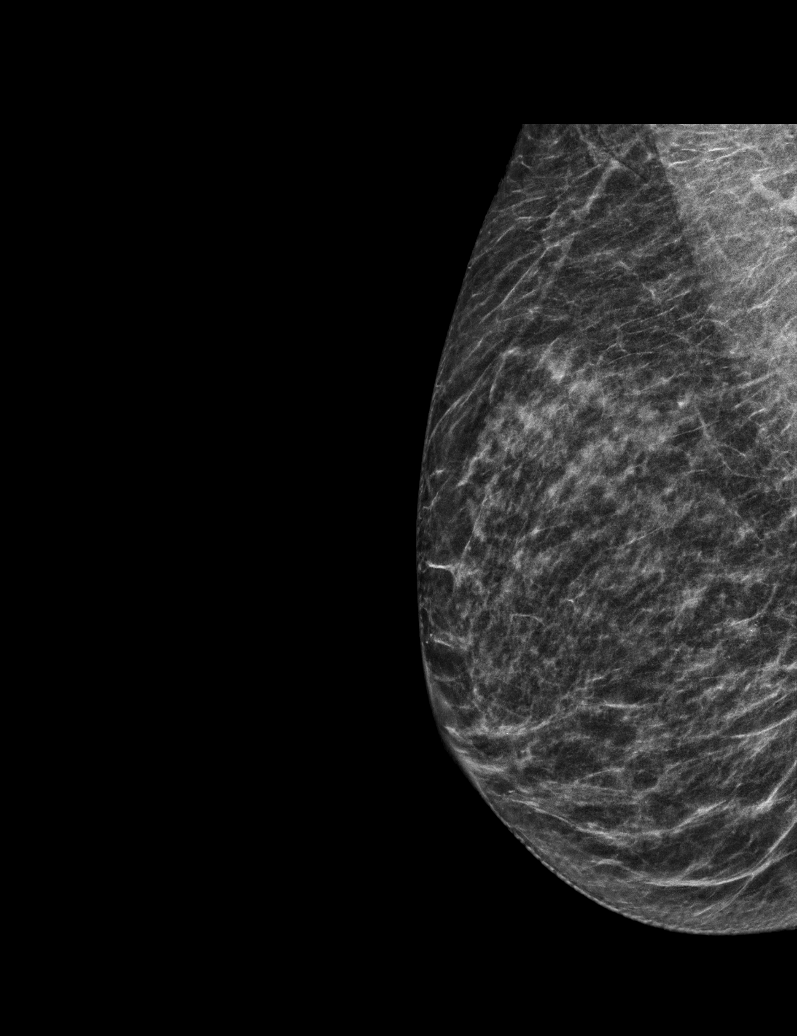

[R MLO tomo · tomo slice 23/45.0]
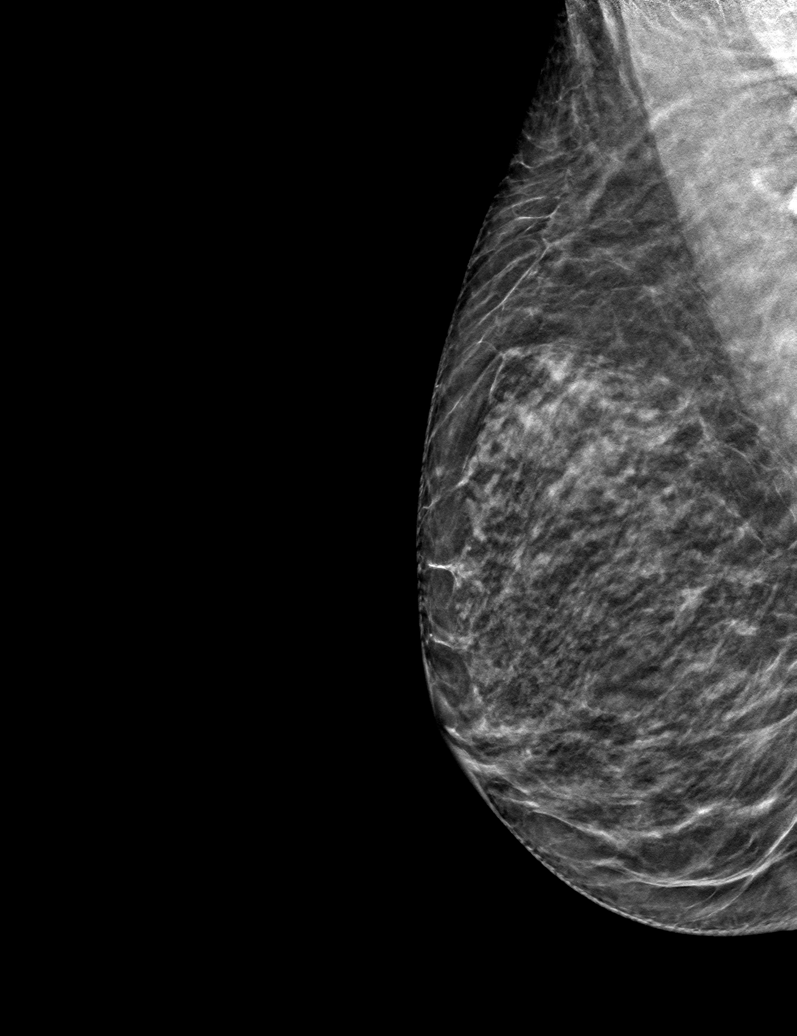

[6 of 30 positions shown; findings below may reference images not displayed]

ACR Breast Density Category b: There are scattered areas of
fibroglandular density.
FINDINGS: Full field CC and MLO views of both breasts were obtained.

RIGHT: No findings suspicious for malignancy.

LEFT: The partially obscured isodense mass in the inner breast, most
conspicuous on the CC view, is unchanged dating back to the
May 2020 mammogram. No new or suspicious findings elsewhere.

Targeted ultrasound is performed, demonstrating the previously
identified circumscribed oval anechoic mass with internal echoes at
the 10 o'clock position 5 cm from the nipple measuring approximately
4 x 1 x 3 mm (previously 5 x 1 x 3 mm), demonstrating posterior
acoustic enhancement and no internal power Doppler flow.
IMPRESSION: 1. Stable likely benign 4 mm mass, likely a complicated cyst, in the
UPPER INNER QUADRANT of the LEFT breast at 10 o'clock 5 cm from the
nipple.
2. No mammographic evidence of malignancy involving the RIGHT
breast.

RECOMMENDATION:
LEFT breast ultrasound at the time of annual BILATERAL diagnostic
mammography in 1 year (in order to confirm 2+ years of stability of
the likely benign mass).

I have discussed the findings and recommendations with the patient.
If applicable, a reminder letter will be sent to the patient
regarding the next appointment.

BI-RADS CATEGORY  3: Probably benign.

## 2022-12-02 ENCOUNTER — Other Ambulatory Visit: Payer: Self-pay | Admitting: Neurology

## 2022-12-03 NOTE — Telephone Encounter (Signed)
Requested Prescriptions   Pending Prescriptions Disp Refills   PHENobarbital (LUMINAL) 64.8 MG tablet [Pharmacy Med Name: PHENobarbital 64.8 MG Oral Tablet] 180 tablet 0    Sig: TAKE 2 TABLETS BY MOUTH AT BEDTIME   Last seen 04/14/22 by Dr. Teresa Coombs, next appt scheduled 04/20/23 Dispenses   Dispensed Days Supply Quantity Provider Pharmacy  PHENOBARBITAL 64.8MG  TAB 09/29/2022 90 180 each Windell Norfolk, MD [facility] [phone] ...  PHENOBARBITAL 64.8MG  TAB 06/25/2022 90 180 each Windell Norfolk, MD [facility] [phone] ...  PHENOBARBITAL 64.8MG  TAB 03/30/2022 90 180 each Windell Norfolk, MD [facility] [phone] ...  PHENOBARBITAL 64.8MG  TAB 12/30/2021 90 180 each Windell Norfolk, MD [facility] [phone] .Marland KitchenMarland Kitchen

## 2023-01-19 ENCOUNTER — Ambulatory Visit: Payer: Medicare Other | Admitting: Family Medicine

## 2023-01-22 ENCOUNTER — Telehealth: Payer: Self-pay | Admitting: General Practice

## 2023-01-22 NOTE — Telephone Encounter (Signed)
Caller name: Genelda Roark  On DPR?: Yes  Call back number: [phone]  Provider they see: Windell Norfolk (neurology for epilepsy)  Reason for call:  Would like to Est Care and have a physical with Beverely Low Clearence Cheek Azalia Bilis)

## 2023-01-22 NOTE — Telephone Encounter (Signed)
Pt would like to est care with you . Please Advise ?

## 2023-01-26 NOTE — Telephone Encounter (Signed)
Ok to est per Dr Tabori  

## 2023-01-26 NOTE — Telephone Encounter (Signed)
Ok to establish 

## 2023-02-12 ENCOUNTER — Ambulatory Visit: Payer: Medicare Other | Admitting: Family Medicine

## 2023-02-25 ENCOUNTER — Ambulatory Visit (INDEPENDENT_AMBULATORY_CARE_PROVIDER_SITE_OTHER): Payer: Medicare Other | Admitting: Family Medicine

## 2023-02-25 ENCOUNTER — Encounter: Payer: Self-pay | Admitting: Family Medicine

## 2023-02-25 VITALS — BP 138/84 | HR 68 | Temp 98.3°F | Resp 17 | Ht 64.0 in | Wt 118.0 lb

## 2023-02-25 DIAGNOSIS — E785 Hyperlipidemia, unspecified: Secondary | ICD-10-CM | POA: Insufficient documentation

## 2023-02-25 DIAGNOSIS — Z1231 Encounter for screening mammogram for malignant neoplasm of breast: Secondary | ICD-10-CM | POA: Diagnosis not present

## 2023-02-25 DIAGNOSIS — E871 Hypo-osmolality and hyponatremia: Secondary | ICD-10-CM | POA: Diagnosis not present

## 2023-02-25 DIAGNOSIS — G40309 Generalized idiopathic epilepsy and epileptic syndromes, not intractable, without status epilepticus: Secondary | ICD-10-CM | POA: Diagnosis not present

## 2023-02-25 DIAGNOSIS — M810 Age-related osteoporosis without current pathological fracture: Secondary | ICD-10-CM

## 2023-02-25 MED ORDER — ALENDRONATE SODIUM 70 MG PO TABS: 70.0000 mg | ORAL_TABLET | ORAL | 3 refills | Status: DC

## 2023-02-25 NOTE — Progress Notes (Unsigned)
   Subjective:    Patient ID: Brenda Woods, female    DOB: 01-26-1956, 67 y.o.   MRN: [id number]  HPI New to establish.  Previous PCP- [facility]  Epilepsy- ongoing issue for pt.  Follows w/ Dr Teresa Coombs at [facility].  Currently on Carbamazepine and Phenobarbital.  Pt has not had a recent seizure on this regimen.  Hyponatremia- upon reviewing labs, this has been ongoing for the last few years.  Thought to be caused by seizure meds but no hyponatremia workup noted.  Osteoporosis- DEXA done 08/23/21 showed T scare of -4.7 for L radius.  Not currently on medication.    Hyperlipidemia- last LDL was 194 in 2022.  This was not treated nor repeated.  Health Maintenance- due for repeat mammo.     Review of Systems For ROS see HPI     Objective:   Physical Exam Vitals reviewed.  Constitutional:      General: She is not in acute distress.    Appearance: Normal appearance. She is well-developed. She is not ill-appearing.  HENT:     Head: Normocephalic and atraumatic.  Eyes:     Conjunctiva/sclera: Conjunctivae normal.     Pupils: Pupils are equal, round, and reactive to light.  Neck:     Thyroid: No thyromegaly.  Cardiovascular:     Rate and Rhythm: Normal rate and regular rhythm.     Pulses: Normal pulses.     Heart sounds: Normal heart sounds. No murmur heard. Pulmonary:     Effort: Pulmonary effort is normal. No respiratory distress.     Breath sounds: Normal breath sounds.  Abdominal:     General: There is no distension.     Palpations: Abdomen is soft.     Tenderness: There is no abdominal tenderness.  Musculoskeletal:     Cervical back: Normal range of motion and neck supple.  Lymphadenopathy:     Cervical: No cervical adenopathy.  Skin:    General: Skin is warm and dry.  Neurological:     General: No focal deficit present.     Mental Status: She is alert and oriented to person, place, and time.  Psychiatric:        Mood and Affect: Mood normal.        Behavior: Behavior  normal.           Assessment & Plan:

## 2023-02-25 NOTE — Patient Instructions (Addendum)
Schedule your complete physical in 6 months We'll notify you of your lab results and make any changes if needed START the Alendronate (Fosamax) once weekly for the osteoporosis Make sure you are taking daily Calcium and Vit D supplements (for the osteoporosis) We'll call you to schedule your mammogram Call with any questions or concerns Stay Safe!  Stay Healthy! Welcome!  We're glad to have you!!!

## 2023-02-26 LAB — BASIC METABOLIC PANEL
BUN: 10 mg/dL (ref 6–23)
CO2: 28 meq/L (ref 19–32)
Calcium: 8.8 mg/dL (ref 8.4–10.5)
Chloride: 92 meq/L — ABNORMAL LOW (ref 96–112)
Creatinine, Ser: 0.49 mg/dL (ref 0.40–1.20)
GFR: 96.16 mL/min (ref >60.00)
Glucose, Bld: 94 mg/dL (ref 70–99)
Potassium: 4.2 meq/L (ref 3.5–5.1)
Sodium: 128 meq/L — ABNORMAL LOW (ref 135–145)

## 2023-02-26 LAB — LIPID PANEL
Cholesterol: 354 mg/dL — ABNORMAL HIGH (ref 0–200)
HDL: 93.1 mg/dL (ref >39.00)
LDL Cholesterol: 247 mg/dL — ABNORMAL HIGH (ref 0–99)
NonHDL: 260.56
Total CHOL/HDL Ratio: 4
Triglycerides: 69 mg/dL (ref 0.0–149.0)
VLDL: 13.8 mg/dL (ref 0.0–40.0)

## 2023-02-26 LAB — HEPATIC FUNCTION PANEL
ALT: 13 U/L (ref 0–35)
AST: 24 U/L (ref 0–37)
Albumin: 4.2 g/dL (ref 3.5–5.2)
Alkaline Phosphatase: 94 U/L (ref 39–117)
Bilirubin, Direct: 0.1 mg/dL (ref 0.0–0.3)
Total Bilirubin: 0.3 mg/dL (ref 0.2–1.2)
Total Protein: 7 g/dL (ref 6.0–8.3)

## 2023-02-26 NOTE — Assessment & Plan Note (Signed)
New to provider but upon chart review, this has been ongoing.  Working dx is medication related but pt has not had a hyponatremia workup.  Check labs today to get a current baseline and proceed from there.

## 2023-02-26 NOTE — Assessment & Plan Note (Signed)
New to provider.  DEXA done 2023 showed T score of -4.7 in L radius.  Pt states no medications were prescribed but daily Fosamax was on her med list.  Will start weekly dose instead.  Pt expressed understanding and is in agreement w/ plan.

## 2023-02-26 NOTE — Assessment & Plan Note (Signed)
New.  Last LDL was 194 (2022).  No medication was prescribed at that time and pt has not had labs repeated.  Admits she was not eating properly at that time as husband had passed away from cancer and she was grieving.  Check labs today and determine if medication is needed

## 2023-02-26 NOTE — Assessment & Plan Note (Signed)
New to provider, ongoing for pt.  Currently seeing Dr Teresa Coombs and on Carbamazepine and Phenobarbital.  Thankfully no recent seizures.  Will continue to follow along.

## 2023-02-27 ENCOUNTER — Other Ambulatory Visit: Payer: Medicare Other

## 2023-02-27 DIAGNOSIS — E785 Hyperlipidemia, unspecified: Secondary | ICD-10-CM

## 2023-02-27 LAB — TSH: TSH: 1.37 u[IU]/mL (ref 0.35–5.50)

## 2023-02-27 LAB — VITAMIN D 25 HYDROXY (VIT D DEFICIENCY, FRACTURES): VITD: 38.65 ng/mL (ref 30.00–100.00)

## 2023-02-27 NOTE — Addendum Note (Signed)
Addended by: Sheliah Hatch on: 02/27/2023 07:26 AM   Modules accepted: Orders

## 2023-02-28 LAB — CBC WITH DIFFERENTIAL/PLATELET
Absolute Monocytes: 438 {cells}/uL (ref 200–950)
Basophils Absolute: 21 {cells}/uL (ref 0–200)
Basophils Relative: 0.6 %
Eosinophils Absolute: 0 {cells}/uL — ABNORMAL LOW (ref 15–500)
Eosinophils Relative: 0 %
HCT: 32.4 % — ABNORMAL LOW (ref 35.0–45.0)
Hemoglobin: 11.3 g/dL — ABNORMAL LOW (ref 11.7–15.5)
Lymphs Abs: 1481 {cells}/uL (ref 850–3900)
MCH: 33.4 pg — ABNORMAL HIGH (ref 27.0–33.0)
MCHC: 34.9 g/dL (ref 32.0–36.0)
MCV: 95.9 fL (ref 80.0–100.0)
MPV: 11.5 fL (ref 7.5–12.5)
Monocytes Relative: 12.5 %
Neutro Abs: 1561 {cells}/uL (ref 1500–7800)
Neutrophils Relative %: 44.6 %
Platelets: 225 10*3/uL (ref 140–400)
RBC: 3.38 10*6/uL — ABNORMAL LOW (ref 3.80–5.10)
RDW: 12.5 % (ref 11.0–15.0)
Total Lymphocyte: 42.3 %
WBC: 3.5 10*3/uL — ABNORMAL LOW (ref 3.8–10.8)

## 2023-03-02 ENCOUNTER — Telehealth: Payer: Self-pay

## 2023-03-02 ENCOUNTER — Other Ambulatory Visit: Payer: Self-pay

## 2023-03-02 DIAGNOSIS — E785 Hyperlipidemia, unspecified: Secondary | ICD-10-CM

## 2023-03-02 MED ORDER — ROSUVASTATIN CALCIUM 20 MG PO TABS
20.0000 mg | ORAL_TABLET | Freq: Every day | ORAL | 3 refills | Status: DC
Start: 2023-03-02 — End: 2024-01-22

## 2023-03-02 NOTE — Telephone Encounter (Signed)
-----   Message from Neena Rhymes sent at 03/02/2023  4:24 PM EDT ----- Sodium is back to your usual level and stable.  Your total cholesterol and LDL (bad cholesterol) are both much too high.  Based on this, we will start Crestor 20mg  nightly (Rosuvastatin) and repeat your liver functions at a lab only visit in 6 weeks (dx hyperlipidemia)  Remainder of labs look good

## 2023-03-02 NOTE — Telephone Encounter (Signed)
Pt is aware of lab results and lab apt has been made

## 2023-03-02 NOTE — Telephone Encounter (Signed)
Left vm for pt to call in regards to results . Crestor 20 mg has been sent in and lab repeat liver function order is in she will need a 6 week lab only visit

## 2023-03-06 ENCOUNTER — Other Ambulatory Visit: Payer: Self-pay | Admitting: Family Medicine

## 2023-03-06 DIAGNOSIS — N6002 Solitary cyst of left breast: Secondary | ICD-10-CM

## 2023-03-06 DIAGNOSIS — G40309 Generalized idiopathic epilepsy and epileptic syndromes, not intractable, without status epilepticus: Secondary | ICD-10-CM

## 2023-03-06 DIAGNOSIS — E785 Hyperlipidemia, unspecified: Secondary | ICD-10-CM

## 2023-03-06 DIAGNOSIS — M810 Age-related osteoporosis without current pathological fracture: Secondary | ICD-10-CM

## 2023-03-06 DIAGNOSIS — Z1231 Encounter for screening mammogram for malignant neoplasm of breast: Secondary | ICD-10-CM

## 2023-03-06 DIAGNOSIS — E871 Hypo-osmolality and hyponatremia: Secondary | ICD-10-CM

## 2023-03-19 ENCOUNTER — Other Ambulatory Visit: Payer: Self-pay | Admitting: Neurology

## 2023-03-19 NOTE — Telephone Encounter (Signed)
   Dispensed Days Supply Quantity Provider Pharmacy  PHENOBARBITAL 64.8MG  TAB 12/25/2022 90 180 each Windell Norfolk, MD [facility] [phone] ...  PHENOBARBITAL 64.8MG  TAB 09/29/2022 90 180 each Windell Norfolk, MD [facility] [phone] ...  PHENOBARBITAL 64.8MG  TAB 06/25/2022 90 180 each Windell Norfolk, MD [facility] [phone] ...  PHENOBARBITAL 64.8MG  TAB 03/30/2022 90 180 each Windell Norfolk, MD [facility] [phone] ...      Last visit 04/14/2022 Next visit 04/10/2023

## 2023-04-10 ENCOUNTER — Other Ambulatory Visit (INDEPENDENT_AMBULATORY_CARE_PROVIDER_SITE_OTHER): Payer: Medicare Other

## 2023-04-10 DIAGNOSIS — E785 Hyperlipidemia, unspecified: Secondary | ICD-10-CM | POA: Diagnosis not present

## 2023-04-10 LAB — HEPATIC FUNCTION PANEL
ALT: 15 U/L (ref 0–35)
AST: 29 U/L (ref 0–37)
Albumin: 4.1 g/dL (ref 3.5–5.2)
Alkaline Phosphatase: 84 U/L (ref 39–117)
Bilirubin, Direct: 0.1 mg/dL (ref 0.0–0.3)
Total Bilirubin: 0.3 mg/dL (ref 0.2–1.2)
Total Protein: 7 g/dL (ref 6.0–8.3)

## 2023-04-14 DIAGNOSIS — H43813 Vitreous degeneration, bilateral: Secondary | ICD-10-CM | POA: Diagnosis not present

## 2023-04-14 DIAGNOSIS — H2513 Age-related nuclear cataract, bilateral: Secondary | ICD-10-CM | POA: Diagnosis not present

## 2023-04-14 DIAGNOSIS — H04123 Dry eye syndrome of bilateral lacrimal glands: Secondary | ICD-10-CM | POA: Diagnosis not present

## 2023-04-14 DIAGNOSIS — H25013 Cortical age-related cataract, bilateral: Secondary | ICD-10-CM | POA: Diagnosis not present

## 2023-04-20 ENCOUNTER — Encounter: Payer: Self-pay | Admitting: Neurology

## 2023-04-20 ENCOUNTER — Ambulatory Visit: Payer: Medicare Other | Admitting: Neurology

## 2023-04-20 VITALS — BP 126/68 | Ht 64.0 in | Wt 118.0 lb

## 2023-04-20 DIAGNOSIS — G40909 Epilepsy, unspecified, not intractable, without status epilepticus: Secondary | ICD-10-CM

## 2023-04-20 DIAGNOSIS — E7849 Other hyperlipidemia: Secondary | ICD-10-CM

## 2023-04-20 DIAGNOSIS — Z5181 Encounter for therapeutic drug level monitoring: Secondary | ICD-10-CM

## 2023-04-20 NOTE — Progress Notes (Signed)
PATIENT: Brenda Woods DOB: 1956/04/28  REASON FOR VISIT: follow up HISTORY FROM: patient  Chief Complaint  Patient presents with   New Patient (Initial Visit)    Rm 13, sz, no sz, no missed medications    HISTORY OF PRESENT ILLNESS: 04/20/2023:  Patient presents today for follow-up, last visit was a year ago.  Since then, she has been doing well, no seizure or seizure like activity.  She is compliant with the carbamazepine and phenobarbital.  Denies any side effect from the medication.  She reports her PCP recently started her on Crestor for hyperlipidemia but she has not been taking the medication due to being fearful of potential liver side effects.  No other complaints, no other concerns.  04/14/2022:  Patient presents today for follow-up, last visit was in October 2022.  Since then she has not had any additional seizures.  She reported compliance with carbamazepine and phenobarbital.  Denies any side effect from the medication.  No concerns.  Overall she is doing well   04/09/2021:  Brenda Woods presents today for follow-up for generalized epilepsy.  She was last seen on September 1 and at that time, plan was to continue with carbamazepine 500 mg twice daily and phenobarbital 129.6 mg at bedtime.  However she did have a lab work by her PMD which showed a sodium of 126.  At that time review of labs show a baseline sodium of 128-131 over the past 5 years but plan was to decrease the carbamazepine to 400 mg twice a day and to start the patient on Keppra 250 mg twice daily.  Patient said that he took the Keppra, could not tolerate it, took it for about 6 to 7 days but was feeling very jittery anxious therefore she discontinued.  She went back to the original dose of carbamazepine 500 mg twice a day and phenobarbital 129.6 mg.  She reported she is back to her normal self, she has been sprinkling more salt in her food.  Denies any additional side effects since discontinuing the Keppra  and no other complaint.  No seizures.     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 She has continued carbamazepine 500mg  BID and phenobarbital 129.6mg  at bedtime since last seizure in 2015. Prior to 2015, she had been on phenobarbital and carbamazepine 400mg  BID for as far back as she can remember (probably since 1990). Last seen 08/2020 and doing well on current regimen. She was seen by PCP 03/04/2021. Labs revealed sodium level of 126. Baseline 128-131 over past 5 years. CBC 3.71, HGB 12. Liver enzymes and GGT normal.    She has tolerated regimen for many years and very hesitant to switch AED. She denies concerns of mentation changes, weakness or unusual fatigue. She lost her husband and now lives alone. She is driving without difficulty. She is able to manage medications and perform ADLs independently. She does not remember ever taking levetiracetam in the past.   08/08/2020 ALL: She returns for seizure follow up. She continues carbamazepine 500mg  BID and phenobarbital 129.6mg  at bedtime. She is doing very well. No seizure activity. She continues to work. She does not have PCP.   08/08/2019 ALL:  Brenda Woods is a 66 y.o. female here today for follow up for seizure. She continues carbamazepine 500mg  twice daily and phenobarbital 129.6mg  at bedtime (two 64.8mg  tablets). She denies recent seizure activity or missed doses of medication. She denies obvious adverse effects. She  is staying well hydrated. She does not have an established PCP. She reports a history of white coat syndrome. She does not check BP at home. She denies chest pain, shortness or breath, dizziness or headaches.   HISTORY: (copied from [employer] note on 08/02/2018)  Ms. Kruckenberg, 67 year old female returns for yearly followup,  with a history of seizure disorder with last seizure occurring in May 2015 and then previously in 1990.  Her last seizure occurred  in the grocery store and was reaching  for something when she started having jerking of her extremities. 911 was called. She has no recollection of the events. According to the hospital record she was dehydrated. Her Tegretol dose was increased to 500 mg twice daily. She is also on phenobarbital.  On return visit today she has not had recurrent seizure activity, she needs refills on her medications and labs. No new  neurologic complaints.  No interval medical issues.  She continues to work full-time. She continues to drive a car without difficulty   HISTORY: She had a history of seizure disorder that is well controlled on Tegretol and Phenobarbital. Her last seizure occurred in 1990. Denies side effects to her medication. She had been [facility] with onset of seizures at age 60. She has had multiple trials of medications. She has no other significant medical problems.    REVIEW OF SYSTEMS: Out of a complete 14 system review of symptoms, the patient complains only of the following symptoms, none and all other reviewed systems are negative.  ALLERGIES: No Known Allergies  HOME MEDICATIONS: Outpatient Medications Prior to Visit  Medication Sig Dispense Refill   Calcium Carb-Cholecalciferol 600-10 MG-MCG TABS Take 1 tablet twice a day by oral route.     carbamazepine (TEGRETOL XR) 100 MG 12 hr tablet Take 5 tablets (500 mg total) by mouth 2 (two) times daily. TAKE 5 TABLETS BY MOUTH TWICE DAILY 900 tablet 1   Multiple Vitamins-Minerals (ONE-A-DAY 50 PLUS PO) Take by mouth.     PHENobarbital (LUMINAL) 64.8 MG tablet TAKE 2 TABLETS BY MOUTH AT BEDTIME 180 tablet 2   zinc gluconate 50 MG tablet Take 50 mg by mouth daily.     alendronate (FOSAMAX) 70 MG tablet Take 1 tablet (70 mg total) by mouth every 7 (seven) days. Take with a full glass of water on an empty stomach. (Patient not taking: Reported on 04/20/2023) 12 tablet 3   rosuvastatin (CRESTOR) 20 MG tablet Take 1 tablet (20 mg total) by mouth daily. (Patient not taking:  Reported on 04/20/2023) 30 tablet 3   Vitamin D, Ergocalciferol, (DRISDOL) 1.25 MG (50000 UNIT) CAPS capsule Take 1 capsule (50,000 Units total) by mouth every 7 (seven) days. (Patient not taking: Reported on 04/20/2023) 8 capsule 0   No facility-administered medications prior to visit.    PAST MEDICAL HISTORY: Past Medical History:  Diagnosis Date   Allergy    Arthritis    elbows   Cataract    Osteopenia    Seizure (HCC)    last seizure 17, onset age 16   Seizure (HCC)     PAST SURGICAL HISTORY: Past Surgical History:  Procedure Laterality Date   ANKLE FRACTURE SURGERY  07-05-08    FAMILY HISTORY: Family History  Problem Relation Age of Onset   Alzheimer's disease Father    Breast cancer Maternal Aunt    Colon cancer Neg Hx    Esophageal cancer Neg Hx    Rectal cancer Neg Hx  Stomach cancer Neg Hx     SOCIAL HISTORY: Social History   Socioeconomic History   Marital status: Married    Spouse name: Casimiro Needle   Number of children: 0   Years of education: 12   Highest education level: Not on file  Occupational History    Employer: [employer]  Tobacco Use   Smoking status: Never   Smokeless tobacco: Never  Vaping Use   Vaping status: Never Used  Substance and Sexual Activity   Alcohol use: No   Drug use: Never   Sexual activity: Not Currently  Other Topics Concern   Not on file  Social History Narrative   Patient is married to Clifton. 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 Patient has a high school education.   Patient is right-handed.   Patient drinks about 2 glasses of soda daily.   Social Determinants of Health   Financial Resource Strain: Not on file  Food Insecurity: Not on file  Transportation Needs: Not on file  Physical Activity: Not on file  Stress: Not on file  Social Connections: Not on file  Intimate Partner Violence: Not on file    PHYSICAL  EXAM  Vitals:   04/20/23 1447  BP: 126/68  Weight: 118 lb (53.5 kg)  Height: 5\' 4"  (1.626 m)     Body mass index is 20.25 kg/m.  Generalized: Well developed, in no acute distress  Neurological examination  Mentation: Alert oriented to time, place, history taking. Follows all commands speech and language fluent Motor: The motor testing reveals 5 over 5 strength of all 4 extremities. Good symmetric motor tone is noted throughout.    DIAGNOSTIC DATA (LABS, IMAGING, TESTING) - I reviewed patient records, labs, notes, testing and imaging myself where available.      No data to display           Lab Results  Component Value Date   WBC 3.5 (L) 02/27/2023   HGB 11.3 (L) 02/27/2023   HCT 32.4 (L) 02/27/2023   MCV 95.9 02/27/2023   PLT 225 02/27/2023      Component Value Date/Time   NA 128 (L) 02/25/2023 1418   NA 127 (L) 04/11/2021 1415   K 4.2 02/25/2023 1418   CL 92 (L) 02/25/2023 1418   CO2 28 02/25/2023 1418   GLUCOSE 94 02/25/2023 1418   BUN 10 02/25/2023 1418   BUN 11 04/11/2021 1415   CREATININE 0.49 02/25/2023 1418   CALCIUM 8.8 02/25/2023 1418   PROT 7.0 04/10/2023 1328   PROT 7.0 08/08/2020 0859   ALBUMIN 4.1 04/10/2023 1328   ALBUMIN 4.3 08/08/2020 0859   AST 29 04/10/2023 1328   ALT 15 04/10/2023 1328   ALKPHOS 84 04/10/2023 1328   BILITOT 0.3 04/10/2023 1328   BILITOT <0.2 08/08/2020 0859   GFRNONAA 99 08/08/2020 0859   GFRAA 114 08/08/2020 0859   Lab Results  Component Value Date   CHOL 354 (H) 02/25/2023   HDL 93.10 02/25/2023   LDLCALC 247 (H) 02/25/2023   TRIG 69.0 02/25/2023   CHOLHDL 4 02/25/2023   No results found for: "HGBA1C" No results found for: "VITAMINB12" Lab Results  Component Value Date   TSH 1.37 02/25/2023     ASSESSMENT AND PLAN 67 y.o. year old female  has a past medical history of Allergy, Arthritis, Cataract, Osteopenia, Seizure (HCC), and Seizure (HCC). here with     ICD-10-CM  1. Seizure disorder (HCC)   G40.909 Basic Metabolic Panel    Phenobarbital level    Carbamazepine level, total    2. Encounter for therapeutic drug monitoring  Z51.81 Basic Metabolic Panel    Phenobarbital level    Carbamazepine level, total    3. Other hyperlipidemia  E78.49        Overall doing well, no seizures, no side effects from the medication. Last sodium level in August 128 in August 2024, baseline 128-131. At this time, will continue current medications, if there is worsening of Na level, will recommend starting salt tab. Also advised patient to restart crestor due to hyperlipidemia   Plan Continue with Carbamazepine 500 mg BID  Continue with Phenobarb 129.6 mg nightly Will obtain lab work  In the meantime, if there is worsening of Na level, will recommend starting salt tab Follow up in 1 year.    Orders Placed This Encounter  Procedures   Basic Metabolic Panel   Phenobarbital level   Carbamazepine level, total      No orders of the defined types were placed in this encounter.     Windell Norfolk, MD 04/20/2023, 3:23 PM [facility] [address] [place], [place] [address] [phone]

## 2023-04-20 NOTE — Patient Instructions (Signed)
Continue with Carbamazepine 500 mg BID  Continue with Phenobarb 129.6 mg nightly Restart Crestor today  Will obtain labs today.  In the meantime, if there is worsening of Na level, will recommend starting salt tab Follow up in 1 year.

## 2023-04-21 ENCOUNTER — Telehealth: Payer: Self-pay

## 2023-04-21 ENCOUNTER — Other Ambulatory Visit: Payer: Self-pay | Admitting: Neurology

## 2023-04-21 LAB — CARBAMAZEPINE LEVEL, TOTAL: Carbamazepine (Tegretol), S: 8.2 ug/mL (ref 4.0–12.0)

## 2023-04-21 LAB — BASIC METABOLIC PANEL
BUN/Creatinine Ratio: 21 (ref 12–28)
BUN: 10 mg/dL (ref 8–27)
CO2: 25 mmol/L (ref 20–29)
Calcium: 9 mg/dL (ref 8.7–10.3)
Chloride: 91 mmol/L — ABNORMAL LOW (ref 96–106)
Creatinine, Ser: 0.48 mg/dL — ABNORMAL LOW (ref 0.57–1.00)
Glucose: 89 mg/dL (ref 70–99)
Potassium: 4.4 mmol/L (ref 3.5–5.2)
Sodium: 129 mmol/L — ABNORMAL LOW (ref 134–144)
eGFR: 102 mL/min/{1.73_m2} (ref >59)

## 2023-04-21 LAB — PHENOBARBITAL LEVEL: Phenobarbital, Serum: 39 ug/mL (ref 15–40)

## 2023-04-21 MED ORDER — SODIUM CHLORIDE 1 G PO TABS: 1.0000 g | ORAL_TABLET | Freq: Every day | ORAL | 1 refills | Status: DC

## 2023-04-21 NOTE — Progress Notes (Signed)
Please call and advise the patient that the recent labs we checked were within normal limits except for a low sodium, but this is in her range. I will prescribed her salt tab to use once a day and hopefully that can help with her hyponatremia. Please remind patient to keep any upcoming appointments or tests and to call us with any interim questions, concerns, problems or updates. Thanks,   Windell Norfolk, MD

## 2023-04-21 NOTE — Telephone Encounter (Signed)
-----   Message from [facility] sent at 04/21/2023  1:12 PM EDT ----- Please call and advise the patient that the recent labs we checked were within normal limits except for a low sodium, but this is in her range. I will prescribed her salt tab to use once a day and hopefully that can help with her hyponatremia. Please remind patient to keep any upcoming appointments or tests and to call us with any interim questions, concerns, problems or updates. Thanks,   Windell Norfolk, MD

## 2023-04-21 NOTE — Telephone Encounter (Signed)
Called and relayed message to patient and she was grateful. Pt verbalized understanding. Pt had no questions at this time but was encouraged to call back if questions arise.

## 2023-04-29 ENCOUNTER — Ambulatory Visit: Payer: Medicare Other

## 2023-05-07 ENCOUNTER — Ambulatory Visit: Payer: Medicare Other | Admitting: *Deleted

## 2023-05-07 DIAGNOSIS — Z0000 Encounter for general adult medical examination without abnormal findings: Secondary | ICD-10-CM

## 2023-05-07 NOTE — Patient Instructions (Signed)
Brenda Woods , Thank you for taking time to come for your Medicare Wellness Visit. I appreciate your ongoing commitment to your health goals. Please review the following plan we discussed and let me know if I can assist you in the future.   Screening recommendations/referrals: Colonoscopy:  Mammogram:  Bone Density:  Recommended yearly ophthalmology/optometry visit for glaucoma screening and checkup Recommended yearly dental visit for hygiene and checkup  Vaccinations: Influenza vaccine: Education provided Pneumococcal vaccine: up to date Tdap vaccine: Education provided Shingles vaccine: Education provided    Advanced directives: Education provided     Preventive Care 65 Years and Older, Female Preventive care refers to lifestyle choices and visits with your health care provider that can promote health and wellness. What does preventive care include? A yearly physical exam. This is also called an annual well check. Dental exams once or twice a year. Routine eye exams. Ask your health care provider how often you should have your eyes checked. Personal lifestyle choices, including: Daily care of your teeth and gums. Regular physical activity. Eating a healthy diet. Avoiding tobacco and drug use. Limiting alcohol use. Practicing safe sex. Taking low-dose aspirin every day. Taking vitamin and mineral supplements as recommended by your health care provider. What happens during an annual well check? The services and screenings done by your health care provider during your annual well check will depend on your age, overall health, lifestyle risk factors, and family history of disease. Counseling  Your health care provider may ask you questions about your: Alcohol use. Tobacco use. Drug use. Emotional well-being. Home and relationship well-being. Sexual activity. Eating habits. History of falls. Memory and ability to understand (cognition). Work and work  Astronomer. Reproductive health. Screening  You may have the following tests or measurements: Height, weight, and BMI. Blood pressure. Lipid and cholesterol levels. These may be checked every 5 years, or more frequently if you are over 33 years old. Skin check. Lung cancer screening. You may have this screening every year starting at age 68 if you have a 30-pack-year history of smoking and currently smoke or have quit within the past 15 years. Fecal occult blood test (FOBT) of the stool. You may have this test every year starting at age 42. Flexible sigmoidoscopy or colonoscopy. You may have a sigmoidoscopy every 5 years or a colonoscopy every 10 years starting at age 62. Hepatitis C blood test. Hepatitis B blood test. Sexually transmitted disease (STD) testing. Diabetes screening. This is done by checking your blood sugar (glucose) after you have not eaten for a while (fasting). You may have this done every 1-3 years. Bone density scan. This is done to screen for osteoporosis. You may have this done starting at age 8. Mammogram. This may be done every 1-2 years. Talk to your health care provider about how often you should have regular mammograms. Talk with your health care provider about your test results, treatment options, and if necessary, the need for more tests. Vaccines  Your health care provider may recommend certain vaccines, such as: Influenza vaccine. This is recommended every year. Tetanus, diphtheria, and acellular pertussis (Tdap, Td) vaccine. You may need a Td booster every 10 years. Zoster vaccine. You may need this after age 49. Pneumococcal 13-valent conjugate (PCV13) vaccine. One dose is recommended after age 54. Pneumococcal polysaccharide (PPSV23) vaccine. One dose is recommended after age 80. Talk to your health care provider about which screenings and vaccines you need and how often you need them. This information is not  intended to replace advice given to you by  your health care provider. Make sure you discuss any questions you have with your health care provider. Document Released: 07/20/2015 Document Revised: 03/12/2016 Document Reviewed: 04/24/2015 Elsevier Interactive Patient Education  2017 [employer].  Fall Prevention in the Home Falls can cause injuries. They can happen to people of all ages. There are many things you can do to make your home safe and to help prevent falls. What can I do on the outside of my home? Regularly fix the edges of walkways and driveways and fix any cracks. Remove anything that might make you trip as you walk through a door, such as a raised step or threshold. Trim any bushes or trees on the path to your home. Use bright outdoor lighting. Clear any walking paths of anything that might make someone trip, such as rocks or tools. Regularly check to see if handrails are loose or broken. Make sure that both sides of any steps have handrails. Any raised decks and porches should have guardrails on the edges. Have any leaves, snow, or ice cleared regularly. Use sand or salt on walking paths during winter. Clean up any spills in your garage right away. This includes oil or grease spills. What can I do in the bathroom? Use night lights. Install grab bars by the toilet and in the tub and shower. Do not use towel bars as grab bars. Use non-skid mats or decals in the tub or shower. If you need to sit down in the shower, use a plastic, non-slip stool. Keep the floor dry. Clean up any water that spills on the floor as soon as it happens. Remove soap buildup in the tub or shower regularly. Attach bath mats securely with double-sided non-slip rug tape. Do not have throw rugs and other things on the floor that can make you trip. What can I do in the bedroom? Use night lights. Make sure that you have a light by your bed that is easy to reach. Do not use any sheets or blankets that are too big for your bed. They should not hang  down onto the floor. Have a firm chair that has side arms. You can use this for support while you get dressed. Do not have throw rugs and other things on the floor that can make you trip. What can I do in the kitchen? Clean up any spills right away. Avoid walking on wet floors. Keep items that you use a lot in easy-to-reach places. If you need to reach something above you, use a strong step stool that has a grab bar. Keep electrical cords out of the way. Do not use floor polish or wax that makes floors slippery. If you must use wax, use non-skid floor wax. Do not have throw rugs and other things on the floor that can make you trip. What can I do with my stairs? Do not leave any items on the stairs. Make sure that there are handrails on both sides of the stairs and use them. Fix handrails that are broken or loose. Make sure that handrails are as long as the stairways. Check any carpeting to make sure that it is firmly attached to the stairs. Fix any carpet that is loose or worn. Avoid having throw rugs at the top or bottom of the stairs. If you do have throw rugs, attach them to the floor with carpet tape. Make sure that you have a light switch at the top of the stairs  and the bottom of the stairs. If you do not have them, ask someone to add them for you. What else can I do to help prevent falls? Wear shoes that: Do not have high heels. Have rubber bottoms. Are comfortable and fit you well. Are closed at the toe. Do not wear sandals. If you use a stepladder: Make sure that it is fully opened. Do not climb a closed stepladder. Make sure that both sides of the stepladder are locked into place. Ask someone to hold it for you, if possible. Clearly mark and make sure that you can see: Any grab bars or handrails. First and last steps. Where the edge of each step is. Use tools that help you move around (mobility aids) if they are needed. These  include: Canes. Walkers. Scooters. Crutches. Turn on the lights when you go into a dark area. Replace any light bulbs as soon as they burn out. Set up your furniture so you have a clear path. Avoid moving your furniture around. If any of your floors are uneven, fix them. If there are any pets around you, be aware of where they are. Review your medicines with your doctor. Some medicines can make you feel dizzy. This can increase your chance of falling. Ask your doctor what other things that you can do to help prevent falls. This information is not intended to replace advice given to you by your health care provider. Make sure you discuss any questions you have with your health care provider. Document Released: 04/19/2009 Document Revised: 11/29/2015 Document Reviewed: 07/28/2014 Elsevier Interactive Patient Education  2017 [employer].

## 2023-05-07 NOTE — Progress Notes (Signed)
Subjective:   Brenda Woods is a 67 y.o. female who presents for an Initial Medicare Annual Wellness Visit.  Visit Complete: Virtual I connected with  Brenda Woods on 05/07/23 by a audio enabled telemedicine application and verified that I am speaking with the correct person using two identifiers.  Patient Location: Home  Provider Location: Home Office  I discussed the limitations of evaluation and management by telemedicine. The patient expressed understanding and agreed to proceed.  Vital Signs: Because this visit was a virtual/telehealth visit, some criteria may be missing or patient reported. Any vitals not documented were not able to be obtained and vitals that have been documented are patient reported.   Cardiac Risk Factors include: advanced age (>25men, >44 women)     Objective:    There were no vitals filed for this visit. There is no height or weight on file to calculate BMI.     05/07/2023    2:11 PM 07/27/2015    8:33 AM 07/20/2014    8:50 AM 11/06/2013    2:36 AM  Advanced Directives  Does Patient Have a Medical Advance Directive? No No No Patient does not have advance directive;Patient would not like information  Pre-existing out of facility DNR order (yellow form or pink MOST form)    No    Current Medications (verified) Outpatient Encounter Medications as of 05/07/2023  Medication Sig   Calcium Carb-Cholecalciferol 600-10 MG-MCG TABS Take 1 tablet twice a day by oral route.   carbamazepine (TEGRETOL XR) 100 MG 12 hr tablet Take 5 tablets (500 mg total) by mouth 2 (two) times daily. TAKE 5 TABLETS BY MOUTH TWICE DAILY   Multiple Vitamins-Minerals (ONE-A-DAY 50 PLUS PO) Take by mouth.   PHENobarbital (LUMINAL) 64.8 MG tablet TAKE 2 TABLETS BY MOUTH AT BEDTIME   sodium chloride 1 g tablet Take 1 tablet (1 g total) by mouth daily.   Vitamin D, Ergocalciferol, (DRISDOL) 1.25 MG (50000 UNIT) CAPS capsule Take 1 capsule (50,000 Units total) by mouth every 7  (seven) days.   zinc gluconate 50 MG tablet Take 50 mg by mouth daily.   alendronate (FOSAMAX) 70 MG tablet Take 1 tablet (70 mg total) by mouth every 7 (seven) days. Take with a full glass of water on an empty stomach. (Patient not taking: Reported on 04/20/2023)   rosuvastatin (CRESTOR) 20 MG tablet Take 1 tablet (20 mg total) by mouth daily. (Patient not taking: Reported on 04/20/2023)   No facility-administered encounter medications on file as of 05/07/2023.    Allergies (verified) Patient has no known allergies.   History: Past Medical History:  Diagnosis Date   Allergy    Arthritis    elbows   Cataract    Osteopenia    Seizure (HCC)    last seizure 55, onset age 89   Seizure [facility])    Past Surgical History:  Procedure Laterality Date   ANKLE FRACTURE SURGERY  07-05-08   Family History  Problem Relation Age of Onset   Alzheimer's disease Father    Breast cancer Maternal Aunt    Colon cancer Neg Hx    Esophageal cancer Neg Hx    Rectal cancer Neg Hx    Stomach cancer Neg Hx    Social History   Socioeconomic History   Marital status: Married    Spouse name: Casimiro Needle   Number of children: 0   Years of education: 12   Highest education level: Not on file  Occupational History  Employer: [employer]  Tobacco Use   Smoking status: Never   Smokeless tobacco: Never  Vaping Use   Vaping status: Never Used  Substance and Sexual Activity   Alcohol use: No   Drug use: Never   Sexual activity: Not Currently  Other Topics Concern   Not on file  Social History Narrative   Patient is married to Casimiro Needle. 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 Patient has a high school education.   Patient is right-handed.   Patient drinks about 2 glasses of soda daily.   Social Determinants of Health   Financial Resource Strain: Low Risk  (05/07/2023)   Overall Financial Resource Strain (CARDIA)     Difficulty of Paying Living Expenses: Not hard at all  Food Insecurity: No Food Insecurity (05/07/2023)   Hunger Vital Sign    Worried About Running Out of Food in the Last Year: Never true    Ran Out of Food in the Last Year: Never true  Transportation Needs: No Transportation Needs (05/07/2023)   PRAPARE - Administrator, Civil Service (Medical): No    Lack of Transportation (Non-Medical): No  Physical Activity: Sufficiently Active (05/07/2023)   Exercise Vital Sign    Days of Exercise per Week: 5 days    Minutes of Exercise per Session: 40 min  Stress: No Stress Concern Present (05/07/2023)   [employer] of Occupational Health - Occupational Stress Questionnaire    Feeling of Stress : Not at all  Social Connections: Socially Isolated (05/07/2023)   Social Connection and Isolation Panel [NHANES]    Frequency of Communication with Friends and Family: Twice a week    Frequency of Social Gatherings with Friends and Family: Three times a week    Attends Religious Services: Never    Active Member of Clubs or Organizations: No    Attends Banker Meetings: Never    Marital Status: Widowed    Tobacco Counseling Counseling given: Not Answered   Clinical Intake:  Pre-visit preparation completed: Yes  Pain : No/denies pain     Diabetes: No  How often do you need to have someone help you when you read instructions, pamphlets, or other written materials from your doctor or pharmacy?: 1 - Never  Interpreter Needed?: No  Information entered by :: Remi Haggard LPN   Activities of Daily Living    05/07/2023    2:12 PM  In your present state of health, do you have any difficulty performing the following activities:  Hearing? 0  Vision? 0  Difficulty concentrating or making decisions? 0  Walking or climbing stairs? 0  Dressing or bathing? 0  Doing errands, shopping? 0  Preparing Food and eating ? N  Using the Toilet? N  In the past six months,  have you accidently leaked urine? N  Do you have problems with loss of bowel control? N  Managing your Medications? N  Managing your Finances? N  Housekeeping or managing your Housekeeping? N    Patient Care Team: Sheliah Hatch, MD as PCP - General (Family Medicine) Jonette Mate as Attending Physician Toya Smothers (Neurology) Juluis Pitch (Ophthalmology)  Indicate any recent Medical Services you may have received from other than [facility] providers in the past year (date may be approximate).     Assessment:   This is a routine wellness examination for Pelin.  Hearing/Vision screen Hearing Screening - Comments:: No trouble hearing  Vision Screening - Comments:: Up to date [facility]   Goals Addressed             This Visit's Progress    Patient Stated       Continue current lifestyle       Depression Screen    05/07/2023    2:11 PM 02/25/2023    1:39 PM  PHQ 2/9 Scores  PHQ - 2 Score 0 1  PHQ- 9 Score 0 2    Fall Risk    05/07/2023    2:06 PM 02/25/2023    1:39 PM 07/31/2017    7:55 AM  Fall Risk   Falls in the past year? 0 0 No  Number falls in past yr: 0 0   Injury with Fall? 0 0   Risk for fall due to :  No Fall Risks   Follow up Falls evaluation completed;Education provided;Falls prevention discussed Falls evaluation completed     MEDICARE RISK AT HOME: Medicare Risk at Home Any stairs in or around the home?: No If so, are there any without handrails?: No Home free of loose throw rugs in walkways, pet beds, electrical cords, etc?: Yes Adequate lighting in your home to reduce risk of falls?: Yes Life alert?: No Use of a cane, walker or w/c?: No Grab bars in the bathroom?: No Shower chair or bench in shower?: No Elevated toilet seat or a handicapped toilet?: No  TIMED UP AND GO:  Was the test performed? No    Cognitive Function:        05/07/2023    2:12 PM  6CIT Screen  What Year? 0 points  What month? 0 points  What time? 0  points  Count back from 20 0 points  Months in reverse 0 points  Repeat phrase 0 points  Total Score 0 points    Immunizations Immunization History  Administered Date(s) Administered   PFIZER(Purple Top)SARS-COV-2 Vaccination 08/15/2019, 09/09/2019   PNEUMOCOCCAL CONJUGATE-20 03/04/2021    TDAP status: Due, Education has been provided regarding the importance of this vaccine. Advised may receive this vaccine at local pharmacy or Health Dept. Aware to provide a copy of the vaccination record if obtained from local pharmacy or Health Dept. Verbalized acceptance and understanding.  Flu Vaccine status: Due, Education has been provided regarding the importance of this vaccine. Advised may receive this vaccine at local pharmacy or Health Dept. Aware to provide a copy of the vaccination record if obtained from local pharmacy or Health Dept. Verbalized acceptance and understanding.  Pneumococcal vaccine status: Up to date  Covid-19 vaccine status: Information provided on how to obtain vaccines.   Qualifies for Shingles Vaccine? Yes   Zostavax completed No   Shingrix Completed?: No.    Education has been provided regarding the importance of this vaccine. Patient has been advised to call insurance company to determine out of pocket expense if they have not yet received this vaccine. Advised may also receive vaccine at local pharmacy or Health Dept. Verbalized acceptance and understanding.  Screening Tests Health Maintenance  Topic Date Due   COVID-19 Vaccine (3 - Pfizer risk series) 05/23/2023 (Originally 10/07/2019)   Zoster Vaccines- Shingrix (1 of 2) 08/07/2023 (Originally 09/22/1972)   INFLUENZA VACCINE  10/05/2023 (Originally 02/05/2023)   DTaP/Tdap/Td (1 - Tdap) 05/06/2024 (Originally 09/22/1972)   Hepatitis C Screening  05/06/2024 (Originally [birth year]/1971)   MAMMOGRAM  08/28/2023   Medicare Annual Wellness (AWV)  05/06/2024   Colonoscopy  01/16/2029   Pneumonia Vaccine 65+ Years  old   Completed   DEXA SCAN  Completed   HPV VACCINES  Aged Out    Health Maintenance  There are no preventive care reminders to display for this patient.   Colorectal cancer screening: Type of screening: Colonoscopy. Completed 2020. Repeat every 10 years  Mammogram status: Completed  . Repeat every year  Bone Density status: Completed 2023. Results reflect: Bone density results: OSTEOPOROSIS. Repeat every 2 years.  Lung Cancer Screening: (Low Dose CT Chest recommended if Age 82-80 years, 20 pack-year currently smoking OR have quit w/in 15years.) does not qualify.   Lung Cancer Screening Referral:   Additional Screening:  Hepatitis C Screening  never done  Vision Screening: Recommended annual ophthalmology exams for early detection of glaucoma and other disorders of the eye. Is the patient up to date with their annual eye exam?  Yes  Who is the provider or what is the name of the office in which the patient attends annual eye exams? [facility] If pt is not established with a provider, would they like to be referred to a provider to establish care? No .   Dental Screening: Recommended annual dental exams for proper oral hygiene    Community Resource Referral / Chronic Care Management: CRR required this visit?  No   CCM required this visit?  No     Plan:     I have personally reviewed and noted the following in the patient's chart:   Medical and social history Use of alcohol, tobacco or illicit drugs  Current medications and supplements including opioid prescriptions. Patient is not currently taking opioid prescriptions. Functional ability and status Nutritional status Physical activity Advanced directives List of other physicians Hospitalizations, surgeries, and ER visits in previous 12 months Vitals Screenings to include cognitive, depression, and falls Referrals and appointments  In addition, I have reviewed and discussed with patient certain preventive protocols,  quality metrics, and best practice recommendations. A written personalized care plan for preventive services as well as general preventive health recommendations were provided to patient.     Remi Haggard, LPN   62/13/0865   After Visit Summary: (MyChart) Due to this being a telephonic visit, the after visit summary with patients personalized plan was offered to patient via MyChart   Nurse Notes:

## 2023-05-14 DIAGNOSIS — H25012 Cortical age-related cataract, left eye: Secondary | ICD-10-CM | POA: Diagnosis not present

## 2023-05-14 DIAGNOSIS — H25812 Combined forms of age-related cataract, left eye: Secondary | ICD-10-CM | POA: Diagnosis not present

## 2023-05-14 DIAGNOSIS — H2512 Age-related nuclear cataract, left eye: Secondary | ICD-10-CM | POA: Diagnosis not present

## 2023-05-28 DIAGNOSIS — H25811 Combined forms of age-related cataract, right eye: Secondary | ICD-10-CM | POA: Diagnosis not present

## 2023-05-28 DIAGNOSIS — H25011 Cortical age-related cataract, right eye: Secondary | ICD-10-CM | POA: Diagnosis not present

## 2023-05-28 DIAGNOSIS — H2511 Age-related nuclear cataract, right eye: Secondary | ICD-10-CM | POA: Diagnosis not present

## 2023-06-24 ENCOUNTER — Other Ambulatory Visit: Payer: Self-pay | Admitting: Family Medicine

## 2023-06-24 DIAGNOSIS — N6320 Unspecified lump in the left breast, unspecified quadrant: Secondary | ICD-10-CM

## 2023-07-17 ENCOUNTER — Ambulatory Visit
Admission: RE | Admit: 2023-07-17 | Discharge: 2023-07-17 | Disposition: A | Discharge status: Home / Self Care | Payer: Medicare Other | Source: Ambulatory Visit | Attending: Family Medicine | Admitting: Family Medicine

## 2023-07-17 DIAGNOSIS — N6322 Unspecified lump in the left breast, upper inner quadrant: Secondary | ICD-10-CM | POA: Diagnosis not present

## 2023-07-17 DIAGNOSIS — N6320 Unspecified lump in the left breast, unspecified quadrant: Secondary | ICD-10-CM

## 2023-08-11 DIAGNOSIS — Z0289 Encounter for other administrative examinations: Secondary | ICD-10-CM

## 2023-08-19 ENCOUNTER — Telehealth: Payer: Self-pay

## 2023-08-19 NOTE — Telephone Encounter (Signed)
Per MR, DMV form completed and faxed

## 2023-08-26 ENCOUNTER — Encounter: Payer: Medicare Other | Admitting: Family Medicine

## 2023-08-31 ENCOUNTER — Ambulatory Visit (INDEPENDENT_AMBULATORY_CARE_PROVIDER_SITE_OTHER): Payer: Medicare Other | Admitting: Family Medicine

## 2023-08-31 ENCOUNTER — Encounter: Payer: Self-pay | Admitting: Family Medicine

## 2023-08-31 VITALS — BP 138/68 | HR 69 | Temp 98.3°F | Ht 62.5 in | Wt 117.2 lb

## 2023-08-31 DIAGNOSIS — M810 Age-related osteoporosis without current pathological fracture: Secondary | ICD-10-CM | POA: Diagnosis not present

## 2023-08-31 DIAGNOSIS — E7849 Other hyperlipidemia: Secondary | ICD-10-CM

## 2023-08-31 DIAGNOSIS — G40309 Generalized idiopathic epilepsy and epileptic syndromes, not intractable, without status epilepticus: Secondary | ICD-10-CM | POA: Insufficient documentation

## 2023-08-31 DIAGNOSIS — E7800 Pure hypercholesterolemia, unspecified: Secondary | ICD-10-CM | POA: Insufficient documentation

## 2023-08-31 DIAGNOSIS — Z0000 Encounter for general adult medical examination without abnormal findings: Secondary | ICD-10-CM

## 2023-08-31 DIAGNOSIS — K5900 Constipation, unspecified: Secondary | ICD-10-CM | POA: Insufficient documentation

## 2023-08-31 LAB — CBC WITH DIFFERENTIAL/PLATELET
Basophils Absolute: 0 10*3/uL (ref 0.0–0.1)
Basophils Relative: 0.7 % (ref 0.0–3.0)
Eosinophils Absolute: 0 10*3/uL (ref 0.0–0.7)
Eosinophils Relative: 0 % (ref 0.0–5.0)
HCT: 35.5 % — ABNORMAL LOW (ref 36.0–46.0)
Hemoglobin: 12 g/dL (ref 12.0–15.0)
Lymphocytes Relative: 44.3 % (ref 12.0–46.0)
Lymphs Abs: 1.8 10*3/uL (ref 0.7–4.0)
MCHC: 33.9 g/dL (ref 30.0–36.0)
MCV: 101.1 fL — ABNORMAL HIGH (ref 78.0–100.0)
Monocytes Absolute: 0.4 10*3/uL (ref 0.1–1.0)
Monocytes Relative: 9 % (ref 3.0–12.0)
Neutro Abs: 1.9 10*3/uL (ref 1.4–7.7)
Neutrophils Relative %: 46 % (ref 43.0–77.0)
Platelets: 289 10*3/uL (ref 150.0–400.0)
RBC: 3.51 Mil/uL — ABNORMAL LOW (ref 3.87–5.11)
RDW: 13.3 % (ref 11.5–15.5)
WBC: 4 10*3/uL (ref 4.0–10.5)

## 2023-08-31 LAB — VITAMIN D 25 HYDROXY (VIT D DEFICIENCY, FRACTURES): VITD: 52.07 ng/mL (ref 30.00–100.00)

## 2023-08-31 LAB — TSH: TSH: 2.49 u[IU]/mL (ref 0.35–5.50)

## 2023-08-31 NOTE — Assessment & Plan Note (Signed)
 Chronic problem.  Pt reports not currently taking cholesterol medication.  Check labs and restart meds prn.

## 2023-08-31 NOTE — Assessment & Plan Note (Signed)
 Pt's PE WNL.  UTD on mammo, colonoscopy, PNA.  Check labs.  Anticipatory guidance provided.

## 2023-08-31 NOTE — Patient Instructions (Signed)
 Follow up in 6 months to recheck cholesterol We'll notify you of your lab results and make any changes if needed Keep up the good work on healthy diet and regular exercise- you look great! Call with any questions or concerns Stay Safe!  Stay Healthy! Happy Early [place] Ouch!!

## 2023-08-31 NOTE — Progress Notes (Signed)
   Subjective:    Patient ID: Brenda Woods, female    DOB: 1955-09-18, 68 y.o.   MRN: [id number]  HPI CPE- UTD on mammo, colonoscopy, PNA.  No concerns today  Patient Care Team    Relationship Specialty Notifications Start End  Sheliah Hatch, MD PCP - General Family Medicine  02/25/23   Jonette Mate Attending Physician   03/05/21    Comment: PCP, Homero Fellers  Neurology  02/25/23   Juluis Pitch  Ophthalmology  02/25/23     Health Maintenance  Topic Date Due   Hepatitis C Screening  Never done   Zoster Vaccines- Shingrix (1 of 2) Never done   DTaP/Tdap/Td (1 - Tdap) 02/15/2008   COVID-19 Vaccine (3 - Pfizer risk series) 10/07/2019   INFLUENZA VACCINE  Never done   Medicare Annual Wellness (AWV)  05/06/2024   MAMMOGRAM  07/16/2025   Colonoscopy  01/16/2029   Pneumonia Vaccine 61+ Years old  Completed   DEXA SCAN  Completed   HPV VACCINES  Aged Out      Review of Systems Patient reports no vision/ hearing changes, adenopathy,fever, weight change,  persistant/recurrent hoarseness , swallowing issues, chest pain, palpitations, edema, persistant/recurrent cough, hemoptysis, dyspnea (rest/exertional/paroxysmal nocturnal), gastrointestinal bleeding (melena, rectal bleeding), abdominal pain, significant heartburn, bowel changes, GU symptoms (dysuria, hematuria, incontinence), Gyn symptoms (abnormal  bleeding, pain),  syncope, focal weakness, memory loss, numbness & tingling, skin/hair/nail changes, abnormal bruising or bleeding, anxiety, or depression.     Objective:   Physical Exam General Appearance:    Alert, cooperative, no distress, appears stated age  Head:    Normocephalic, without obvious abnormality, atraumatic  Eyes:    PERRL, conjunctiva/corneas clear, EOM's intact both eyes  Ears:    Normal TM's and external ear canals, both ears  Nose:   Nares normal, septum midline, mucosa normal, no drainage    or sinus tenderness  Throat:   Lips,  mucosa, and tongue normal; teeth and gums normal  Neck:   Supple, symmetrical, trachea midline, no adenopathy;    Thyroid: no enlargement/tenderness/nodules  Back:     Symmetric, no curvature, ROM normal, no CVA tenderness  Lungs:     Clear to auscultation bilaterally, respirations unlabored  Chest Wall:    No tenderness or deformity   Heart:    Regular rate and rhythm, S1 and S2 normal, no murmur, rub   or gallop  Breast Exam:    Deferred to mammo  Abdomen:     Soft, non-tender, bowel sounds active all four quadrants,    no masses, no organomegaly  Genitalia:    Deferred   Rectal:    Extremities:   Extremities normal, atraumatic, no cyanosis or edema  Pulses:   2+ and symmetric all extremities  Skin:   Skin color, texture, turgor normal, no rashes or lesions  Lymph nodes:   Cervical, supraclavicular, and axillary nodes normal  Neurologic:   CNII-XII intact, normal strength, sensation and reflexes    throughout          Assessment & Plan:

## 2023-09-01 LAB — BASIC METABOLIC PANEL
BUN: 10 mg/dL (ref 6–23)
CO2: 25 meq/L (ref 19–32)
Calcium: 8.9 mg/dL (ref 8.4–10.5)
Chloride: 91 meq/L — ABNORMAL LOW (ref 96–112)
Creatinine, Ser: 0.51 mg/dL (ref 0.40–1.20)
GFR: 94.9 mL/min (ref >60.00)
Glucose, Bld: 85 mg/dL (ref 70–99)
Potassium: 4.5 meq/L (ref 3.5–5.1)
Sodium: 127 meq/L — ABNORMAL LOW (ref 135–145)

## 2023-09-01 LAB — HEPATIC FUNCTION PANEL
ALT: 11 U/L (ref 0–35)
AST: 23 U/L (ref 0–37)
Albumin: 4.3 g/dL (ref 3.5–5.2)
Alkaline Phosphatase: 69 U/L (ref 39–117)
Bilirubin, Direct: 0.1 mg/dL (ref 0.0–0.3)
Total Bilirubin: 0.4 mg/dL (ref 0.2–1.2)
Total Protein: 7.4 g/dL (ref 6.0–8.3)

## 2023-09-01 LAB — LIPID PANEL
Cholesterol: 306 mg/dL — ABNORMAL HIGH (ref 0–200)
HDL: 94.7 mg/dL (ref >39.00)
LDL Cholesterol: 197 mg/dL — ABNORMAL HIGH (ref 0–99)
NonHDL: 210.99
Total CHOL/HDL Ratio: 3
Triglycerides: 69 mg/dL (ref 0.0–149.0)
VLDL: 13.8 mg/dL (ref 0.0–40.0)

## 2023-09-02 ENCOUNTER — Telehealth: Payer: Self-pay

## 2023-09-02 DIAGNOSIS — E1169 Type 2 diabetes mellitus with other specified complication: Secondary | ICD-10-CM

## 2023-09-02 NOTE — Telephone Encounter (Signed)
-----   Message from Neena Rhymes sent at 09/02/2023  7:31 AM EST ----- Your total cholesterol and LDL (bad cholesterol) are well above goal.  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  No changes at this time  Everything else looks great!

## 2023-09-03 NOTE — Addendum Note (Signed)
 Addended by: Eldred Manges on: 09/03/2023 10:28 AM   Modules accepted: Orders

## 2023-09-03 NOTE — Telephone Encounter (Signed)
 If she doesn't want to take a statin, I would recommend a referral to lipid clinic to discuss options

## 2023-09-03 NOTE — Telephone Encounter (Signed)
 FYI: Patient has agreed reluctantly to going to the lipid clinic states she would really rather Dr Beverely Low just takes care of it but if lipid clinic is recommended by Dr Beverely Low then she will go.   I have placed referral for lipid clinic at this time per your last note.

## 2023-09-03 NOTE — Telephone Encounter (Signed)
 Patient does not want to take due to dizziness side effect when she would take previously is there something else to take for cholesterol

## 2023-09-07 ENCOUNTER — Telehealth: Payer: Self-pay

## 2023-09-07 NOTE — Telephone Encounter (Signed)
 Noted . Gave pt [facility] phone number to call and make appt.

## 2023-09-07 NOTE — Telephone Encounter (Signed)
 Pt got a MyChart message indicating that she was referred to Lipid Clinic.  In the message, it said that if she hadn't heard from them in 3 days to call their office.  I do not see any mention of an appt.  But one is definitely needed for the Lipid Clinic as it is not a walk-in.

## 2023-09-07 NOTE — Telephone Encounter (Signed)
 Pt states she thought this was a walk in appt . Pt was advised to call and make appt with [facility]. She then stated someone called her and told her she had appt 09/07/2023 @ 11 for [facility].  Patient is calling to schedule her appt. She states

## 2023-09-07 NOTE — Telephone Encounter (Signed)
 Copied from CRM [id number]. Topic: Referral - Question >> Sep 07, 2023 10:47 AM Alcus Dad wrote: Reason for CRM: Patient's sister called stating that patient received a letter stating that she had an appt today at [facility]. When patient arrived, staff told her she did not have an appt. Upon checking patient's chart, I did not see an appt. Looking at the notes I did see where she was referred to [facility] and was told to make an appt If she doesn't hear from [facility] within 3 days. Advise the sister that they could go ahead and schedule the appt. Since they were there. **Patient's sister was very rude**

## 2023-09-08 ENCOUNTER — Telehealth: Payer: Self-pay

## 2023-09-08 ENCOUNTER — Telehealth: Payer: Self-pay | Admitting: Family Medicine

## 2023-09-08 NOTE — Telephone Encounter (Signed)
 We can try a different cholesterol medication- Atorvastatin 40mg  nightly, #30, 3 refills- to see if she tolerates this better

## 2023-09-08 NOTE — Telephone Encounter (Signed)
 Pt was called a states she had her sister take her to [facility] location ([facility]) and they refused to see her. See wonders if her body can wait until after July. And she wants to resume taking her cholesterol medication she found in her home. She'd like to of course talk to [place]. Was just here for physical 08/31/23. Advise

## 2023-09-08 NOTE — Telephone Encounter (Signed)
 Copied from CRM [id number]. Topic: Clinical - Medication Question >> Sep 08, 2023 11:40 AM Fredrich Romans wrote: Reason for CRM: Patient was prescribed rosuvastatin (CRESTOR) 20 MG tablet cholesterol medication,she stopped taking them due to them making her dizzy. She would like to know do Dr Beverely Low think that is will be safe for her to start back taking them even thought they make her dizzy?

## 2023-09-08 NOTE — Telephone Encounter (Signed)
 Patient is questioning about restarting rosuvastatin 20mg , Patient recently stopped this medication due to feeling dizzy when taking this. Was prescribed this medication on 08/31/2023.

## 2023-09-09 ENCOUNTER — Other Ambulatory Visit: Payer: Self-pay

## 2023-09-09 MED ORDER — ATORVASTATIN CALCIUM 40 MG PO TABS: 40.0000 mg | ORAL_TABLET | Freq: Every day | ORAL | 3 refills | Status: DC

## 2023-09-09 NOTE — Telephone Encounter (Signed)
 Patient would like a call back from dr Beverely Low  she would like a call back on her mobile  device [phone] she stated a detailed vm be left if she does not answer she also stated she would like a vm left on her house phone as well

## 2023-09-09 NOTE — Telephone Encounter (Signed)
 Called Patient to relay Dr. Rennis Golden recommendations. Patient verbalized understanding, did give address and phone number for [facility] to patient as well. Told patient if she has any troubles to call us back.

## 2023-09-09 NOTE — Telephone Encounter (Signed)
 It is ok for pt to wait until this summer to be seen for her cholesterol.  In the meantime, we have switched her cholesterol medication to Atorvastatin b/c the Crestor made her dizzy.

## 2023-09-09 NOTE — Telephone Encounter (Signed)
 Called patient earlier about scheduling questions and she did verify that she did pick up the atorvastatin 40mg  and started this medication.

## 2023-09-09 NOTE — Telephone Encounter (Signed)
 Patient was referred to [facility] and can not get an appointment until July and is worried if she needs a sooner appointment.

## 2023-09-09 NOTE — Telephone Encounter (Signed)
 Atorvastatin 40mg  has been sent to pharmacy . Left vm for pt to call back to office.

## 2023-09-11 ENCOUNTER — Ambulatory Visit: Payer: Self-pay | Admitting: Family Medicine

## 2023-09-11 NOTE — Telephone Encounter (Signed)
 Copied from CRM [id number]. Topic: Clinical - Medication Question >> Sep 11, 2023  4:31 PM [place] J wrote: Reason for CRM: Patient is wondering if she could receive clarification on if she's supposed to be taking atorvastatin & rosuvastatin daily.   Chief Complaint: medication assistance  Symptoms: NA Frequency:  Pertinent Negatives: NA Disposition: [] ED /[] Urgent Care (no appt availability in office) / [] Appointment(In office/virtual)/ []  [facility]/ [x] Home Care/ [] Refused Recommended Disposition /[] [place] [facility]/ []  Follow-up with PCP Additional Notes: advised pt of message from PCP on 09/09/23. Pt states the dizziness has resolved and she has been tolerating the rosuvastatin well. Advised if she prefers to continue taking that one just to put aside Atorvastatin so she doesn't get them mixed up and can use at later time if other sx develop with rosuvastatin. Pt verbalized understanding and had no further questions.   Sheliah Hatch, MD to [facility]  09/09/23 10:23 AM Note It is ok for pt to wait until this summer to be seen for her cholesterol.  In the meantime, we have switched her cholesterol medication to Atorvastatin b/c the Crestor made her dizzy.     Reason for Disposition  Caller has medicine question only, adult not sick, AND triager answers question  Answer Assessment - Initial Assessment Questions 1. NAME of MEDICINE: "What medicine(s) are you calling about?"     atorvastatin & rosuvastatin 2. QUESTION: "What is your question?" (e.g., double dose of medicine, side effect)     Wanting to know which one she suppose to be taking or both  3. PRESCRIBER: "Who prescribed the medicine?" Reason: if prescribed by specialist, call should be referred to that group.     PCP 4. SYMPTOMS: "Do you have any symptoms?" If Yes, ask: "What symptoms are you having?"  "How bad are the symptoms (e.g., mild, moderate, severe)     Had dizziness but that has  resolved  Protocols used: Medication Question Call-A-AH

## 2023-09-14 NOTE — Telephone Encounter (Signed)
 Copied from CRM [id number]. Topic: Clinical - Medication Question >> Sep 14, 2023 10:35 AM Lennart Pall wrote: Reason for CRM: Patient is taking rosuvastatin (CRESTOR) 20 MG tablet and she said it is not making her feel well. She said its making her dizzy, wanting to know if she should keep taking the medication. She said the new medication for atorvastatin (LIPITOR) 40 MG tablet is working good for her.

## 2023-09-14 NOTE — Telephone Encounter (Signed)
 See other note for patient she needs clarification if she is to be taking Rosuvastatin or Atorvastatin   Per Dr Wille Glaser previous note:  In the meantime, we have switched her cholesterol medication to Atorvastatin b/c the Crestor made her dizzy.   Called patient and informed her of the note from Dr Beverely Low about continuing Atorvastatin not Rosuvastatin   Patient is now clear on the change

## 2023-09-17 ENCOUNTER — Other Ambulatory Visit: Payer: Self-pay | Admitting: Neurology

## 2023-09-17 NOTE — Telephone Encounter (Signed)
 Last seen 04/20/23, 04/19/24 Dispenses   Dispensed Days Supply Quantity Provider Pharmacy  PHENOBARBITAL 64.8MG  TAB 06/17/2023 90 180 each Windell Norfolk, MD [facility] [phone] ...  PHENOBARBITAL 64.8MG  TAB 03/22/2023 90 180 each Windell Norfolk, MD [facility] [phone] ...  PHENOBARBITAL 64.8MG  TAB 12/25/2022 90 180 each Windell Norfolk, MD [facility] [phone] ...  PHENOBARBITAL 64.8MG  TAB 09/29/2022 90 180 each Windell Norfolk, MD [facility] [phone] .Marland KitchenMarland Kitchen

## 2023-10-13 ENCOUNTER — Other Ambulatory Visit: Payer: Self-pay | Admitting: Neurology

## 2023-10-19 ENCOUNTER — Telehealth: Payer: Self-pay | Admitting: Neurology

## 2023-10-19 NOTE — Telephone Encounter (Signed)
 Call to patient, Brenda Woods reports getting notification that DMV is requiring road test per Dr. Samara Crest. I reviewed with Dr. Samara Crest and typo on form. Agreeable to no road test  due to seizure free for many years and medication compliance. Patient updated and will get form to us . I explained that there will no charge for updated form.

## 2023-10-19 NOTE — Telephone Encounter (Signed)
 Pt said received a letter from [facility]; need to take a driver's test or my license would be revoked. Would like a call from the nurse to discuss if Dr. Samara Crest sent something to [facility]

## 2023-10-20 ENCOUNTER — Telehealth: Payer: Self-pay

## 2023-10-20 NOTE — Telephone Encounter (Signed)
 Patient and sister came to office to drop off updated paperwork, assured them it would be completed and faxed today with urgent request and letter requesting no driving test required. Faxed to [phone] kept failing, re faxed to [phone] with confirmation. Copy of form letter also left at front for patient reference.

## 2023-11-27 ENCOUNTER — Ambulatory Visit: Payer: Self-pay

## 2023-11-27 ENCOUNTER — Ambulatory Visit (INDEPENDENT_AMBULATORY_CARE_PROVIDER_SITE_OTHER): Admitting: Student in an Organized Health Care Education/Training Program

## 2023-11-27 VITALS — BP 173/62 | HR 65 | Wt 111.0 lb

## 2023-11-27 DIAGNOSIS — H8190 Unspecified disorder of vestibular function, unspecified ear: Secondary | ICD-10-CM | POA: Diagnosis not present

## 2023-11-27 DIAGNOSIS — E611 Iron deficiency: Secondary | ICD-10-CM | POA: Diagnosis not present

## 2023-11-27 NOTE — Progress Notes (Signed)
   Acute Office Visit  Subjective:     Patient ID: Brenda Woods, female    DOB: 06/16/1956, 68 y.o.   MRN: [id number]  Chief Complaint  Patient presents with   Dizziness    Patient states she has felt very dizzy today and had not improved any.     HPI  68 year old person living with hyperlipidemia and an epilepsy disorder that has been well-controlled on phenobarbital  and Tegretol , here for evaluation of 1 day of acute dizziness.  She woke up feeling a sensation of dizziness, mildly like the room is spinning and unbalanced sensation.  No falls.  No nausea or vomiting.  No fevers or chills.  Denies any other weakness or paresthesias.  She has been functional throughout the day.  Never felt anything like this before.  Denies headaches.  No tinnitus.  No recent changes in her medications.  No upper respiratory tract infections recently.  Has been many years since she had any seizure activity.      Objective:    BP (!) 173/62   Pulse 65   Wt 111 lb (50.3 kg)   SpO2 100%   BMI 19.98 kg/m    Physical Exam  Gen: well-appearing woman Neuro: Alert, conversational, full strength in the upper and lower extremities, normal get up and go, wide-based gait with slight imbalance, normal reflexes, mild tongue deviation to the left, normal sensation throughout.  Cranial nerves otherwise are normal.  No nystagmus detected on exam.  On head impulse test, it is normal, with no catch-up saccades.  Extraocular motion is normal. Ears: Bilateral cerumen Eyes: Normal lenses Heart: Regular, no murmur Lungs: Unlabored, clear throughout Ext warm, no edema, well-perfused      Assessment & Plan:   Problem List Items Addressed This Visit       Unprioritized   Acute vestibular syndrome - Primary   History and exam today are most consistent with acute vestibular syndrome, currently she has a mild to moderate burden of symptoms.  Symptomatic that was upon waking this morning.  Differential diagnosis  could include acute vestibular neuritis versus posterior circulation infarction.  No headaches so I doubt this is vestibular migraine.  Orthostatic vitals were normal.  No tinnitus to suggest Mnire's.  Neuroexam is reassuring today, only mild gait imbalance.  Head impulse test is normal which suggests a possible central cause.  Will check blood work today.  Will order an MRI brain to evaluate for posterior circulation events.  Because her symptom burden is very low, and she is doing well at home, independent in all activities, I think it is safe to work this up as an outpatient.      Relevant Orders   CBC with Differential/Platelet   Comprehensive metabolic panel with GFR   Iron, TIBC and Ferritin Panel   MR Brain Wo Contrast    Ether Hercules, MD

## 2023-11-27 NOTE — Telephone Encounter (Signed)
 This needs to be filed as an error b/c I have 4 open appts on my schedule for today.  I'm not sure they align w/ the times the pt needs, but there are appts available and it would be better for her to be seen here rather than Urgent Care

## 2023-11-27 NOTE — Telephone Encounter (Signed)
 Patient is going to see Dr Gayl Katos this afternoon at 4 she could not make it to the office for Dr Marietta Shorter open afternoon times.

## 2023-11-27 NOTE — Telephone Encounter (Signed)
 FYI patient has been advised to go to Urgent Care today due "no availability in office"

## 2023-11-27 NOTE — Telephone Encounter (Signed)
  Chief Complaint: dizziness Symptoms: mildly light headed one time three days ago Frequency: one time three days ago Pertinent Negatives: Patient denies chest pain, SOB, illness Disposition: [] ED /[] Urgent Care (no appt availability in office) / [x] Appointment(In office/virtual)/ []  [facility]/ [] Home Care/ [] Refused Recommended Disposition /[] [facility]/ []  Follow-up with PCP Additional Notes: states that she became dizzy one time when  up moving around one day this week. Appointment scheduled Copied from CRM [id number]. Topic: Clinical - Red Word Triage >> Nov 27, 2023  9:34 AM Magdalene School wrote: Red Word that prompted transfer to Nurse Triage: Dizzy. Reason for Disposition  [1] MILD dizziness (e.g., walking normally) AND [2] has NOT been evaluated by doctor (or NP/PA) for this  (Exception: Dizziness caused by heat exposure, sudden standing, or poor fluid intake.)  Answer Assessment - Initial Assessment Questions 1. DESCRIPTION: "Describe your dizziness."     When up doing activities 2. LIGHTHEADED: "Do you feel lightheaded?" (e.g., somewhat faint, woozy, weak upon standing)     "Some or a little" 3. VERTIGO: "Do you feel like either you or the room is spinning or tilting?" (i.e. vertigo)     no 4. SEVERITY: "How bad is it?"  "Do you feel like you are going to faint?" "Can you stand and walk?"   - MILD: Feels slightly dizzy, but walking normally.   - MODERATE: Feels unsteady when walking, but not falling; interferes with normal activities (e.g., school, work).   - SEVERE: Unable to walk without falling, or requires assistance to walk without falling; feels like passing out now.      mild 5. ONSET:  "When did the dizziness begin?"     Three days ago 6. AGGRAVATING FACTORS: "Does anything make it worse?" (e.g., standing, change in head position)     unknown 7. HEART RATE: "Can you tell me your heart rate?" "How many beats in 15 seconds?"  (Note: not all patients can  do this)       no 8. CAUSE: "What do you think is causing the dizziness?"     unknown 9. RECURRENT SYMPTOM: "Have you had dizziness before?" If Yes, ask: "When was the last time?" "What happened that time?"     Only once 10. OTHER SYMPTOMS: "Do you have any other symptoms?" (e.g., fever, chest pain, vomiting, diarrhea, bleeding)       no  Protocols used: Dizziness - Lightheadedness-A-AH

## 2023-11-27 NOTE — Assessment & Plan Note (Signed)
 History and exam today are most consistent with acute vestibular syndrome, currently she has a mild to moderate burden of symptoms.  Symptomatic that was upon waking this morning.  Differential diagnosis could include acute vestibular neuritis versus posterior circulation infarction.  No headaches so I doubt this is vestibular migraine.  Orthostatic vitals were normal.  No tinnitus to suggest Mnire's.  Neuroexam is reassuring today, only mild gait imbalance.  Head impulse test is normal which suggests a possible central cause.  Will check blood work today.  Will order an MRI brain to evaluate for posterior circulation events.  Because her symptom burden is very low, and she is doing well at home, independent in all activities, I think it is safe to work this up as an outpatient.

## 2023-11-28 ENCOUNTER — Ambulatory Visit (HOSPITAL_COMMUNITY)
Admission: RE | Admit: 2023-11-28 | Discharge: 2023-11-28 | Disposition: A | Discharge status: Home / Self Care | Source: Ambulatory Visit | Attending: Student in an Organized Health Care Education/Training Program | Admitting: Student in an Organized Health Care Education/Training Program

## 2023-11-28 DIAGNOSIS — R29818 Other symptoms and signs involving the nervous system: Secondary | ICD-10-CM | POA: Diagnosis not present

## 2023-11-28 DIAGNOSIS — H8190 Unspecified disorder of vestibular function, unspecified ear: Secondary | ICD-10-CM

## 2023-11-28 LAB — COMPREHENSIVE METABOLIC PANEL WITH GFR
AG Ratio: 1.6 (calc) (ref 1.0–2.5)
ALT: 23 U/L (ref 6–29)
AST: 31 U/L (ref 10–35)
Albumin: 4.3 g/dL (ref 3.6–5.1)
Alkaline phosphatase (APISO): 84 U/L (ref 37–153)
BUN/Creatinine Ratio: 21 (calc) (ref 6–22)
BUN: 9 mg/dL (ref 7–25)
CO2: 28 mmol/L (ref 20–32)
Calcium: 9.2 mg/dL (ref 8.6–10.4)
Chloride: 90 mmol/L — ABNORMAL LOW (ref 98–110)
Creat: 0.42 mg/dL — ABNORMAL LOW (ref 0.60–1.00)
Globulin: 2.7 g/dL (ref 1.9–3.7)
Glucose, Bld: 93 mg/dL (ref 65–99)
Potassium: 4.2 mmol/L (ref 3.5–5.3)
Sodium: 127 mmol/L — ABNORMAL LOW (ref 135–146)
Total Bilirubin: 0.4 mg/dL (ref 0.2–1.2)
Total Protein: 7 g/dL (ref 6.1–8.1)
eGFR: 105 mL/min/{1.73_m2} (ref >=60)

## 2023-11-28 LAB — CBC WITH DIFFERENTIAL/PLATELET
Absolute Lymphocytes: 1673 {cells}/uL (ref 850–3900)
Absolute Monocytes: 378 {cells}/uL (ref 200–950)
Basophils Absolute: 20 {cells}/uL (ref 0–200)
Basophils Relative: 0.5 %
Eosinophils Absolute: 0 {cells}/uL — ABNORMAL LOW (ref 15–500)
Eosinophils Relative: 0 %
HCT: 34 % — ABNORMAL LOW (ref 35.0–45.0)
Hemoglobin: 11.6 g/dL — ABNORMAL LOW (ref 11.7–15.5)
MCH: 32.8 pg (ref 27.0–33.0)
MCHC: 34.1 g/dL (ref 32.0–36.0)
MCV: 96 fL (ref 80.0–100.0)
MPV: 10.5 fL (ref 7.5–12.5)
Monocytes Relative: 9.7 %
Neutro Abs: 1829 {cells}/uL (ref 1500–7800)
Neutrophils Relative %: 46.9 %
Platelets: 275 10*3/uL (ref 140–400)
RBC: 3.54 10*6/uL — ABNORMAL LOW (ref 3.80–5.10)
RDW: 11.9 % (ref 11.0–15.0)
Total Lymphocyte: 42.9 %
WBC: 3.9 10*3/uL (ref 3.8–10.8)

## 2023-11-28 LAB — IRON,TIBC AND FERRITIN PANEL
%SAT: 40 % (ref 16–45)
Ferritin: 91 ng/mL (ref 16–288)
Iron: 100 ug/dL (ref 45–160)
TIBC: 251 ug/dL (ref 250–450)

## 2023-12-01 ENCOUNTER — Ambulatory Visit: Payer: Self-pay | Admitting: Student in an Organized Health Care Education/Training Program

## 2023-12-01 ENCOUNTER — Ambulatory Visit: Admitting: Family Medicine

## 2023-12-02 ENCOUNTER — Telehealth: Payer: Self-pay

## 2023-12-02 NOTE — Telephone Encounter (Signed)
 I tried to call the patient but got no answer.  No new imaging or other follow-up tests ordered from me.  The acute issue with dizziness seems to have been resolved.  I do not see any other pending imaging except some mammograms that were ordered and not yet completed from August.  Maybe clinical staff can talk with the patient and see what the name of the test was that she is referencing.

## 2023-12-02 NOTE — Telephone Encounter (Signed)
 Patient called back

## 2023-12-02 NOTE — Telephone Encounter (Signed)
 Copied from CRM [id number]. Topic: General - Other >> Dec 02, 2023 10:51 AM Brenda Woods wrote: Reason for CRM: Pt stated that [facility] called and stated that they needed to do follow up test. Pt stated that when she came into the office recently she was told that everything went good. Pt would like for someone to reach out to the hospital to see what the follow up test are for and would like to receive a call back with an update.

## 2023-12-02 NOTE — Telephone Encounter (Signed)
 Called patient back, relayed message from Dr Gayl Katos: "I tried to call the patient but got no answer. No new imaging or other follow-up tests ordered from me. The acute issue with dizziness seems to have been resolved. I do not see any other pending imaging except some mammograms that were ordered and not yet completed from August. Maybe clinical staff can talk with the patient and see what the name of the test was that she is referencing. "   Patient having confusion regarding who Dr Gayl Katos is, she states she thought she was talking to Dr Pinky Bright. Patient states someone from [facility] called and told her what the test was and she can't remember the name. Relayed message again to patient that no further/new imaging or other follow-up tests ordered from Dr Gayl Katos. Patient verbalizes understanding. Advised patient that if [facility] calls back to write down the ordering physician's name and the name of the test they are requesting. Patient verbalizes understanding.  Copied from CRM [id number]. Topic: General - Call Back - No Documentation >> Dec 02, 2023 11:24 AM Shereese L wrote: Reason for CRM: patient would like a call in reference to the call received from the office

## 2023-12-02 NOTE — Telephone Encounter (Signed)
 Per Dr Gayl Katos message yesterday the imagining is fine and no further work up required but then patient was called to do follow up? Please advise?

## 2023-12-07 ENCOUNTER — Telehealth: Payer: Self-pay

## 2023-12-07 NOTE — Telephone Encounter (Signed)
 Copied from CRM [id number]. Topic: General - Other >> Dec 07, 2023  2:21 PM [place] P wrote: Reason for CRM: Pt advise when she called to schedule for mammogram they advise she is not due yet as she already went Jan 10 this year, Pt advise she contacted [facility] on [address]

## 2023-12-07 NOTE — Telephone Encounter (Signed)
 I have requested these results 12/07/2023

## 2023-12-10 ENCOUNTER — Encounter: Payer: Self-pay | Admitting: Student in an Organized Health Care Education/Training Program

## 2023-12-10 ENCOUNTER — Telehealth: Payer: Self-pay | Admitting: Family Medicine

## 2023-12-10 DIAGNOSIS — E611 Iron deficiency: Secondary | ICD-10-CM | POA: Insufficient documentation

## 2023-12-10 NOTE — Telephone Encounter (Signed)
 Placed in your sign folder

## 2023-12-10 NOTE — Telephone Encounter (Signed)
 Orders was sent from [facility] and needs signatures and sent back to its designated place. Paper work is placed in Chartered loss adjuster.  Please advise, Thanks

## 2023-12-15 NOTE — Telephone Encounter (Signed)
 No form in folder to sign

## 2024-01-07 ENCOUNTER — Other Ambulatory Visit: Payer: Self-pay | Admitting: Family Medicine

## 2024-01-10 ENCOUNTER — Other Ambulatory Visit: Payer: Self-pay | Admitting: Family Medicine

## 2024-01-10 ENCOUNTER — Other Ambulatory Visit: Payer: Self-pay | Admitting: Neurology

## 2024-01-18 ENCOUNTER — Ambulatory Visit (INDEPENDENT_AMBULATORY_CARE_PROVIDER_SITE_OTHER): Admitting: Internal Medicine

## 2024-01-18 ENCOUNTER — Encounter (HOSPITAL_BASED_OUTPATIENT_CLINIC_OR_DEPARTMENT_OTHER): Payer: Self-pay | Admitting: Internal Medicine

## 2024-01-18 VITALS — BP 130/69 | HR 78 | Ht 62.5 in | Wt 108.2 lb

## 2024-01-18 DIAGNOSIS — E785 Hyperlipidemia, unspecified: Secondary | ICD-10-CM | POA: Diagnosis not present

## 2024-01-18 DIAGNOSIS — I700 Atherosclerosis of aorta: Secondary | ICD-10-CM

## 2024-01-18 DIAGNOSIS — E7841 Elevated Lipoprotein(a): Secondary | ICD-10-CM | POA: Diagnosis not present

## 2024-01-18 DIAGNOSIS — E7801 Familial hypercholesterolemia: Secondary | ICD-10-CM

## 2024-01-18 NOTE — Patient Instructions (Signed)
 Medication Instructions:   Your physician recommends that you continue on your current medications as directed. Please refer to the Current Medication list given to you today.  *If you need a refill on your cardiac medications before your next appointment, please call your pharmacy*  Lab Work:  TODAY!!!! NMR/LPA  If you have labs (blood work) drawn today and your tests are completely normal, you will receive your results only by: MyChart Message (if you have MyChart) OR A paper copy in the mail If you have any lab test that is abnormal or we need to change your treatment, we will call you to review the results.  Testing/Procedures:  None ordered.  Follow-Up: At [facility], you and your health needs are our priority.  As part of our continuing mission to provide you with exceptional heart care, our providers are all part of one team.  This team includes your primary Cardiologist (physician) and Advanced Practice Providers or APPs (Physician Assistants and Nurse Practitioners) who all work together to provide you with the care you need, when you need it.  Your next appointment:   Follow up will be based upon your labs.     Provider:   K. Italy Hilty, MD    We recommend signing up for the patient portal called MyChart.  Sign up information is provided on this After Visit Summary.  MyChart is used to connect with patients for Virtual Visits (Telemedicine).  Patients are able to view lab/test results, encounter notes, upcoming appointments, etc.  Non-urgent messages can be sent to your provider as well.   To learn more about what you can do with MyChart, go to [email or web address].

## 2024-01-18 NOTE — Progress Notes (Signed)
 LIPID CLINIC CONSULT NOTE  Chief Complaint:  Manage dyslipidemia  Primary Care Physician: Mahlon Comer BRAVO, MD  Primary Cardiologist:  None  HPI:  Brenda Woods is a 68 y.o. female who is being seen today for the evaluation of dyslipidemia at the request of Tabori, Comer BRAVO, MD. This is a pleasant 68 year old female kindly referred for evaluation management of dyslipidemia.  She has a history of high cholesterol with a recent lipid profile in February showing total cholesterol 306, HDL 94, triglycerides 69 and LDL 197.  Prior to that her LDL was 247.  She had been on therapy with rosuvastatin  but was intolerant of this and switched to atorvastatin , however she has not had repeat lipid testing since starting on that.  She seems to be tolerating it, however and has been on it for several months.  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  She has also tried to lower saturated fats in her diet.  PMHx:  Past Medical History:  Diagnosis Date   Allergy    Arthritis    elbows   Cataract    Osteopenia    Seizure (HCC)    last seizure 1991, onset age 68   Seizure [facility])     Past Surgical History:  Procedure Laterality Date   ANKLE FRACTURE SURGERY  07-05-08    FAMHx:  Family History  Problem Relation Age of Onset   Alzheimer's disease Father    Breast cancer Maternal Aunt    Colon cancer Neg Hx    Esophageal cancer Neg Hx    Rectal cancer Neg Hx    Stomach cancer Neg Hx     SOCHx:   reports that she has never smoked. She has never used smokeless tobacco. She reports that she does not drink alcohol and does not use drugs.  ALLERGIES:  No Known Allergies  ROS: Pertinent items noted in HPI and remainder of comprehensive ROS otherwise negative.  HOME MEDS: Current Outpatient Medications on File Prior to Visit  Medication Sig Dispense Refill   atorvastatin  (LIPITOR) 40 MG tablet Take 1 tablet by mouth once daily 30 tablet 0   alendronate   (FOSAMAX ) 70 MG tablet Take 1 tablet (70 mg total) by mouth every 7 (seven) days. Take with a full glass of water on an empty stomach. (Patient not taking: Reported on 01/18/2024) 12 tablet 3   Calcium  Carb-Cholecalciferol 600-10 MG-MCG TABS Take 1 tablet twice a day by oral route.     carbamazepine  (TEGRETOL  XR) 100 MG 12 hr tablet TAKE 5 TABLETS BY MOUTH TWICE DAILY 900 tablet 0   Multiple Vitamins-Minerals (ONE-A-DAY 50 PLUS PO) Take by mouth.     PHENobarbital  (LUMINAL) 64.8 MG tablet TAKE 2 TABLETS BY MOUTH AT BEDTIME 180 tablet 3   rosuvastatin  (CRESTOR ) 20 MG tablet Take 1 tablet (20 mg total) by mouth daily. (Patient not taking: Reported on 01/18/2024) 30 tablet 3   Vitamin D , Ergocalciferol , (DRISDOL ) 1.25 MG (50000 UNIT) CAPS capsule Take 1 capsule (50,000 Units total) by mouth every 7 (seven) days. (Patient not taking: Reported on 01/18/2024) 8 capsule 0   zinc gluconate 50 MG tablet Take 50 mg by mouth daily. (Patient not taking: Reported on 01/18/2024)     No current facility-administered medications on file prior to visit.    LABS/IMAGING: No results found for this or any previous visit (from the past 48 hours). No results found.  LIPID PANEL:    Component Value Date/Time   CHOL  306 (H) 08/31/2023 1304   TRIG 69.0 08/31/2023 1304   HDL 94.70 08/31/2023 1304   CHOLHDL 3 08/31/2023 1304   VLDL 13.8 08/31/2023 1304   LDLCALC 197 (H) 08/31/2023 1304    No results found for: LIPOA   WEIGHTS: Wt Readings from Last 3 Encounters:  01/18/24 108 lb 3.2 oz (49.1 kg)  11/27/23 111 lb (50.3 kg)  08/31/23 117 lb 4 oz (53.2 kg)    VITALS: BP 130/69   Pulse 78   Ht 5' 2.5 (1.588 m)   Wt 108 lb 3.2 oz (49.1 kg)   SpO2 96%   BMI 19.47 kg/m   EXAM: Deferred  EKG: Deferred  ASSESSMENT: Dyslipidemia with LDL greater than 190, probable familial hyperlipidemia Goal LDL less than 70 Aortic atherosclerosis  PLAN: 1.   [name] has a dyslipidemia with LDL greater than 190.   This is likely a familial hyperlipidemia.  She could not tolerate rosuvastatin  but has done well on atorvastatin .  She needs repeat lipids.  Her target LDL is less than 70.  As mentioned I would like to update lipids to see her response to the atorvastatin  we will check an NMR and LP(a) today.  Based on that lab work I can make further recommendations about additional therapy which might include PCSK9 inhibitor or potentially ezetimibe and/or Nexletol.  Thanks again for the kind referral.  Vinie KYM Maxcy, MD, [facility], FNLA, FACP  [place]  [facility]  Medical Director of the Advanced Lipid Disorders &  Cardiovascular Risk Reduction Clinic Diplomate of the American Board of Clinical Lipidology Attending Cardiologist  Direct Dial: [phone]  Fax: [phone]  Website:  [email or web address] Vinie JAYSON Maxcy 01/18/2024, 2:37 PM

## 2024-01-19 LAB — NMR, LIPOPROFILE
Cholesterol, Total: 226 mg/dL — ABNORMAL HIGH (ref 100–199)
HDL Particle Number: 31.1 umol/L (ref >=30.5)
HDL-C: 83 mg/dL (ref >39)
LDL Particle Number: 1127 nmol/L — ABNORMAL HIGH (ref <1000)
LDL Size: 21.4 nm (ref >20.5)
LDL-C (NIH Calc): 131 mg/dL — ABNORMAL HIGH (ref 0–99)
LP-IR Score: <25 (ref <=45)
Small LDL Particle Number: <90 nmol/L (ref <=527)
Triglycerides: 69 mg/dL (ref 0–149)

## 2024-01-19 LAB — LIPOPROTEIN A (LPA): Lipoprotein (a): 571 nmol/L — ABNORMAL HIGH (ref <75.0)

## 2024-01-21 ENCOUNTER — Telehealth: Payer: Self-pay | Admitting: Family Medicine

## 2024-01-21 DIAGNOSIS — E785 Hyperlipidemia, unspecified: Secondary | ICD-10-CM

## 2024-01-21 NOTE — Telephone Encounter (Signed)
 Called patient to gather more details about appointment. Left vm.

## 2024-01-21 NOTE — Telephone Encounter (Signed)
 Copied from CRM [id number]. Topic: Clinical - Medication Question >> Jan 20, 2024  5:55 PM Shereese L wrote:   Reason for CRM patient would like a call back in reference to the appt she had yesterday to get full details on what's going on with her cholesterol   She also wants the doctor to speak to Vinie JAYSON Maxcy in the other office @ [phone]

## 2024-01-22 ENCOUNTER — Ambulatory Visit: Payer: Self-pay | Admitting: Internal Medicine

## 2024-01-22 MED ORDER — ROSUVASTATIN CALCIUM 20 MG PO TABS: 20.0000 mg | ORAL_TABLET | Freq: Every evening | ORAL | 1 refills | Status: DC

## 2024-01-22 NOTE — Addendum Note (Signed)
 Addended by: Dystany Duffy on: 01/22/2024 10:46 AM   Modules accepted: Orders

## 2024-01-22 NOTE — Telephone Encounter (Signed)
 Patient returned call. After reviewing her chart and visit at cardiology I noticed that back in February this year, Dr. Mahlon instructed her to restart her Crestor  20mg . This was not sent in and patient was never made aware. One phone call attempt was made, but I can not see that the message was ever routed back to the clinical pool to attempt to reach her again. I apologized to the patient for that mistake that was made on our end and let her know that I would send in that crestor  to her pharmacy right now so that she can get started on that. She has a follow up with Dr. Mahlon next month. I will make Dr. Mahlon aware that she did not start the crestor  until July. I let her know that the labs her cardiologist did at her visit have not been reviewed by him yet and that office will contact her about those.

## 2024-01-25 NOTE — Telephone Encounter (Signed)
 FYI

## 2024-01-25 NOTE — Telephone Encounter (Signed)
 Copied from CRM [id number]. Topic: Clinical - Medication Question >> Jan 25, 2024 10:16 AM Laymon HERO wrote: Reason for CRM: Patient wanting to let Gretta Sor, CMA that she got the Crestor  medication and will start taking it tonight at bedtime

## 2024-02-02 ENCOUNTER — Telehealth: Payer: Self-pay | Admitting: Internal Medicine

## 2024-02-02 ENCOUNTER — Ambulatory Visit: Payer: Self-pay

## 2024-02-02 NOTE — Telephone Encounter (Signed)
 Called patient to relay message, Left Vm to return call.

## 2024-02-02 NOTE — Telephone Encounter (Signed)
 Pt c/o medication issue:  1. Name of Medication:   atorvastatin  (LIPITOR) 40 MG tablet    rosuvastatin  (CRESTOR ) 20 MG tablet    2. How are you currently taking this medication (dosage and times per day)?  Take 1 tablet by mouth once daily     Take 1 tablet (20 mg total) by mouth at bedtime. Take 1 tablet (20 mg total) by mouth nightly     3. Are you having a reaction (difficulty breathing--STAT)? No  4. What is your medication issue? Pt is requesting a callback regarding her wanting to discuss these medications with the nurse. She needs clarity on dosage and how she should take them. She called the prescribing provider first but they advised her to contact her Cardiologist office. Please advise.

## 2024-02-02 NOTE — Telephone Encounter (Signed)
 FYI Only or Action Required?: Action required by provider: clinical question for provider.  Patient was last seen in primary care on 11/27/2023 by Jerrell Cleatus Ned, MD.  Called Nurse Triage reporting Dizziness.  Symptoms began a week ago.  Interventions attempted: Prescription medications: Rosuvastatin .  Symptoms are: unchanged.  Triage Disposition: See Physician Within 24 Hours  Patient/caregiver understands and will follow disposition?: UnsureCopied from CRM #[id number]. Topic: Clinical - Red Word Triage >> Feb 02, 2024  2:51 PM Suzen RAMAN wrote: Red Word that prompted transfer to Nurse Triage: dizziness and diarrhea per parient possiblely due to medication change rosuvastatin  doses increase. Reason for Disposition  Taking a medicine that could cause dizziness (e.g., blood pressure medications, diuretics)  Answer Assessment - Initial Assessment Questions 1. DESCRIPTION: Describe your dizziness.  I feel I'm spinning or the room is 2. LIGHTHEADED: Do you feel lightheaded? (e.g., somewhat faint, woozy, weak upon standing)     At times 3. VERTIGO: Do you feel like either you or the room is spinning or tilting? (i.e., vertigo)     both 4. SEVERITY: How bad is it?  Do you feel like you are going to faint? Can you stand and walk?    I can walk 5. ONSET:  When did the dizziness begin?     Shortly after 7/18 6. AGGRAVATING FACTORS: Does anything make it worse? (e.g., standing, change in head position)     Taking the medication 7. HEART RATE: Can you tell me your heart rate? How many beats in 15 seconds?  (Note: Not all patients can do this.)       na 8. CAUSE: What do you think is causing the dizziness? (e.g., decreased fluids or food, diarrhea, emotional distress, heat exposure, new medicine, sudden standing, vomiting; unknown)     Possibly medication  9. RECURRENT SYMPTOM: Have you had dizziness before? If Yes, ask: When was the last time? What  happened that time?     Na  10. OTHER SYMPTOMS: Do you have any other symptoms? (e.g., fever, chest pain, vomiting, diarrhea, bleeding)       Diarrhea   Pt noticed symptoms of dizziness and diarrhea after increasing Rosuvastatin  to 40mg .  Dizziness hit first and has had several bouts of diarrhea. No falls.  I feel like I'm spinning around. Pt states she drinks plenty of water and soda.  Pt is asking if medication needs to be lessened. No appt made at this time.  Please advise  Protocols used: Dizziness - Lightheadedness-A-AH

## 2024-02-02 NOTE — Telephone Encounter (Signed)
 Patient called back and was advised and provided Dr Elwanda phone number to call and discuss this, advised I would also forward this message thread to the Cardiologist so he was aware of what has been going on and what was advised from PCP office stand point

## 2024-02-02 NOTE — Telephone Encounter (Signed)
 Dizziness has been an intermittent issue for her.  Usually not a side effect from rosuvastatin .  It looks like Dr. Katharina office with cardiology increased the dose of the rosuvastatin  recently.  She is also being considered to start a new cholesterol medication called Repatha.  I suggest she follows up on this issue with their office.  If she is able to start Repatha soon, likely she would be able to reduce or stop the rosuvastatin .

## 2024-02-02 NOTE — Telephone Encounter (Signed)
 Returned call to patient to discuss symptoms.  She is having dizziness and diarrhea with the Lipitor 40mg .  Both Lipitor and Crestor  are still listed on her medication list.  Looks like her PCP sent both of these in.  Per Dr. Katharina note 7/18: Very high LP(a) at 571.  LDL 131, LDL-P elevated. Would recommend adding Repatha since it could lower LP(a) by 20-30%. Recommend repeat NMR and LP(a) in 3 months. Continue current meds in addition to Repatha.  Might qualify for research study.   Should she go back on the Lipitor (less side effects) and start Repatha?  Will forward to Dr Mona for review.

## 2024-02-10 ENCOUNTER — Other Ambulatory Visit (HOSPITAL_COMMUNITY): Payer: Self-pay

## 2024-02-10 ENCOUNTER — Telehealth: Payer: Self-pay | Admitting: Pharmacy Technician

## 2024-02-10 NOTE — Telephone Encounter (Signed)
 Pharmacy Patient Advocate Encounter  Received notification from [facility] that Prior Authorization for repatha has been APPROVED from 02/10/24 to 02/09/25. Ran test claim, Copay is $45.00- one month. This test claim was processed through [facility]- copay amounts may vary at other pharmacies due to pharmacy/plan contracts, or as the patient moves through the different stages of their insurance plan.   PA #/Case ID/Reference #: [id number]

## 2024-02-10 NOTE — Telephone Encounter (Signed)
Scanned in media as well.

## 2024-02-10 NOTE — Telephone Encounter (Signed)
 Pharmacy Patient Advocate Encounter   Received notification from Physician's Office that prior authorization for Repatha is required/requested.   Insurance verification completed.   The patient is insured through [employer] .   Per test claim: PA required; PA submitted to above mentioned insurance via CoverMyMeds Key/confirmation #/EOC [id number] Status is pending

## 2024-02-11 ENCOUNTER — Other Ambulatory Visit: Payer: Self-pay | Admitting: Family Medicine

## 2024-02-12 ENCOUNTER — Telehealth: Payer: Self-pay | Admitting: Internal Medicine

## 2024-02-12 NOTE — Telephone Encounter (Signed)
 LVMTCB 02/12/24 at 4:07 pm

## 2024-02-12 NOTE — Telephone Encounter (Signed)
 Pt c/o medication issue:  1. Name of Medication:   atorvastatin  (LIPITOR) 40 MG tablet Take 1 tablet by mouth once daily    2. How are you currently taking this medication (dosage and times per day)? As written   3. Are you having a reaction (difficulty breathing--STAT)? No   4. What is your medication issue? Pt called in stating she is still having diarrhea with Lipitor every time she eats even if she only has small portions. Please advise.

## 2024-02-12 NOTE — Telephone Encounter (Signed)
 Left message to call back

## 2024-02-12 NOTE — Telephone Encounter (Signed)
 At last visit with Dr. Mona she reported tolerating Atorvastatin  well. If with new diarrhea, may hold Atorvastatin  for 2 weeks and update us  on symptoms.   Brenda Amedee S Santiana Glidden, NP

## 2024-02-12 NOTE — Telephone Encounter (Signed)
 Pt c/o medication issue:  1. Name of Medication:   atorvastatin  (LIPITOR) 40 MG tablet    2. How are you currently taking this medication (dosage and times per day)?    3. Are you having a reaction (difficulty breathing--STAT)? no  4. What is your medication issue? Medication is causing her to have stomach issues. Please advise

## 2024-02-12 NOTE — Telephone Encounter (Signed)
 Pt returning nurse's call. Please advise

## 2024-02-12 NOTE — Telephone Encounter (Signed)
 Called and spoke to pt regarding s/e of Atorvastatin . She is c/o Loose Stools occurring 1-3 times daily. These started after she had finished almost half of the bottle of Atorvastatin . She feels it is directly related to the Atorvastatin . She took her last dose on 02/11/24 and today, she has not had any loose stools. Advised her to pause medication for 2 weeks and to update us  on 02/24/2024. [Patient is somewhat of a poor historian and communication is somewhat difficult.]   Per Reche Finder, NP:  Finder Reche RAMAN, NP    02/12/24  4:34 PM Note At last visit with Dr. Mona she reported tolerating Atorvastatin  well. If with new diarrhea, may hold Atorvastatin  for 2 weeks and update us  on symptoms.    Reche RAMAN Finder, NP       She also has concerns r/t safety and s/e of Repatha. States she has Epilepsy and wonders if meds she is currently on for this could contribute to negative s/e (of both atorvastatin  and repatha).

## 2024-02-14 NOTE — Telephone Encounter (Signed)
 The most common side effect of Repatha is 10% of individuals will get a runny nose.  This often improves over time. It is a very commonly used medicine and very well tolerated. Would not expect any interaction b/w her epilepsy mediations and her cholesterol lowering agents.   Brenda Woods S Charish Schroepfer, NP

## 2024-02-14 NOTE — Telephone Encounter (Signed)
See duplicate phone encounter.   Loel Dubonnet, NP

## 2024-02-16 NOTE — Telephone Encounter (Signed)
 Called pt. Verified name and DOB. Informed patient about Repatha approval and copay cost.  Patient hesitant to start medication due to risks. I relayed the following message from Reche Finder NP:  The most common side effect of Repatha is 10% of individuals will get a runny nose.  This often improves over time. It is a very commonly used medicine and very well tolerated. Would not expect any interaction b/w her epilepsy mediations and her cholesterol lowering agents.      Patient stated she would like to speak to Dr. Mahlon about medication safety before starting new medication. I advised patient this recommendations was based on her lab results and from a cardiologist standpoint and would be beneficial to start, but will wait to hear back from patient regarding decision.   Josie RN

## 2024-02-16 NOTE — Telephone Encounter (Signed)
See 8/6 telephone encounter

## 2024-02-19 ENCOUNTER — Telehealth: Payer: Self-pay

## 2024-02-19 NOTE — Telephone Encounter (Signed)
 Copied from CRM #[id number]. Topic: Appointments - Scheduling Inquiry for Clinic >> Feb 19, 2024  9:54 AM Berneda FALCON wrote: Reason for CRM: Patient has an appt with Dr. Mahlon on 8/25 and does not want sister to come back with her during this appt. She does not want her to know she doesn't want her to come back with her, but feels she is nosey and does not want her to be with her during this appt. Please notify the sister to wait in the waiting room for this appt.

## 2024-02-19 NOTE — Telephone Encounter (Signed)
 Note made on APPT

## 2024-02-29 ENCOUNTER — Encounter: Payer: Self-pay | Admitting: Family Medicine

## 2024-02-29 ENCOUNTER — Ambulatory Visit (INDEPENDENT_AMBULATORY_CARE_PROVIDER_SITE_OTHER): Payer: Medicare Other | Admitting: Family Medicine

## 2024-02-29 VITALS — BP 130/58 | HR 89 | Temp 98.9°F | Ht 62.5 in | Wt 108.4 lb

## 2024-02-29 DIAGNOSIS — E7849 Other hyperlipidemia: Secondary | ICD-10-CM | POA: Diagnosis not present

## 2024-02-29 DIAGNOSIS — E871 Hypo-osmolality and hyponatremia: Secondary | ICD-10-CM

## 2024-02-29 DIAGNOSIS — Z1159 Encounter for screening for other viral diseases: Secondary | ICD-10-CM | POA: Diagnosis not present

## 2024-02-29 NOTE — Patient Instructions (Addendum)
 Schedule your complete physical in 6 months We'll notify you of your lab results and make any changes if needed Keep up the good work on healthy diet- you can do it! Call or message Dr Mona and let him know you are willing to try Repatha if covered Call with any questions or concerns Happy Labor Day!!!

## 2024-02-29 NOTE — Progress Notes (Unsigned)
   Subjective:    Patient ID: Brenda Woods, female    DOB: 1956-05-01, 68 y.o.   MRN: [id number]  HPI Hyperlipidemia- chronic problem.  On Lipitor 40mg  daily.  Has NMR profile done 7/14 w/ LDL particle number 1127, LDL C 131.  Dr Mona recommended starting Repatha.  No CP, SOB, abd pain, N/V.    Hyponatremia- on 11/27/23 Na was 127.  Pt reports feeling well.  No N/V, confusion, dizziness.   Review of Systems For ROS see HPI     Objective:   Physical Exam Vitals reviewed.  Constitutional:      General: She is not in acute distress.    Appearance: Normal appearance. She is well-developed. She is not ill-appearing.  HENT:     Head: Normocephalic and atraumatic.  Eyes:     Conjunctiva/sclera: Conjunctivae normal.     Pupils: Pupils are equal, round, and reactive to light.  Neck:     Thyroid : No thyromegaly.  Cardiovascular:     Rate and Rhythm: Normal rate and regular rhythm.     Heart sounds: Normal heart sounds. No murmur heard. Pulmonary:     Effort: Pulmonary effort is normal. No respiratory distress.     Breath sounds: Normal breath sounds.  Abdominal:     General: There is no distension.     Palpations: Abdomen is soft.     Tenderness: There is no abdominal tenderness.  Musculoskeletal:     Cervical back: Normal range of motion and neck supple.  Lymphadenopathy:     Cervical: No cervical adenopathy.  Skin:    General: Skin is warm and dry.  Neurological:     General: No focal deficit present.     Mental Status: She is alert and oriented to person, place, and time.  Psychiatric:        Mood and Affect: Mood normal.        Behavior: Behavior normal.           Assessment & Plan:

## 2024-03-01 LAB — HEPATIC FUNCTION PANEL
ALT: 23 U/L (ref 0–35)
AST: 35 U/L (ref 0–37)
Albumin: 4.1 g/dL (ref 3.5–5.2)
Alkaline Phosphatase: 105 U/L (ref 39–117)
Bilirubin, Direct: 0.1 mg/dL (ref 0.0–0.3)
Total Bilirubin: 0.4 mg/dL (ref 0.2–1.2)
Total Protein: 7.2 g/dL (ref 6.0–8.3)

## 2024-03-01 LAB — HEPATITIS C ANTIBODY: Hepatitis C Ab: NONREACTIVE

## 2024-03-01 LAB — BASIC METABOLIC PANEL WITH GFR
BUN: 11 mg/dL (ref 6–23)
CO2: 29 meq/L (ref 19–32)
Calcium: 8.7 mg/dL (ref 8.4–10.5)
Chloride: 90 meq/L — ABNORMAL LOW (ref 96–112)
Creatinine, Ser: 0.4 mg/dL (ref 0.40–1.20)
GFR: 100.27 mL/min (ref >60.00)
Glucose, Bld: 72 mg/dL (ref 70–99)
Potassium: 5.1 meq/L (ref 3.5–5.1)
Sodium: 127 meq/L — ABNORMAL LOW (ref 135–145)

## 2024-03-01 LAB — CBC WITH DIFFERENTIAL/PLATELET
Basophils Absolute: 0 K/uL (ref 0.0–0.1)
Basophils Relative: 0.4 % (ref 0.0–3.0)
Eosinophils Absolute: 0 K/uL (ref 0.0–0.7)
Eosinophils Relative: 0.1 % (ref 0.0–5.0)
HCT: 32.5 % — ABNORMAL LOW (ref 36.0–46.0)
Hemoglobin: 11 g/dL — ABNORMAL LOW (ref 12.0–15.0)
Lymphocytes Relative: 36.3 % (ref 12.0–46.0)
Lymphs Abs: 1.5 K/uL (ref 0.7–4.0)
MCHC: 33.8 g/dL (ref 30.0–36.0)
MCV: 98.5 fl (ref 78.0–100.0)
Monocytes Absolute: 0.4 K/uL (ref 0.1–1.0)
Monocytes Relative: 9.1 % (ref 3.0–12.0)
Neutro Abs: 2.2 K/uL (ref 1.4–7.7)
Neutrophils Relative %: 54.1 % (ref 43.0–77.0)
Platelets: 296 K/uL (ref 150.0–400.0)
RBC: 3.3 Mil/uL — ABNORMAL LOW (ref 3.87–5.11)
RDW: 13.1 % (ref 11.5–15.5)
WBC: 4 K/uL (ref 4.0–10.5)

## 2024-03-01 NOTE — Assessment & Plan Note (Signed)
 Chronic problem.  Currently on Lipitor 40mg  daily.  NMR profile last month shows she is not at goal on her statin.  Repatha was recommended.  Pt is fearful of side effects.  Discussed that this medication is usually very well tolerated.  She thinks that this will replace her Lipitor.  I explained that I think it's in addition to her statin.  She seems confused as to what the actual plan is.  Will message cardiology on her behalf.

## 2024-03-01 NOTE — Assessment & Plan Note (Signed)
 Ongoing issue.  Likely due to her Tegretol  use.  Currently asymptomatic.  Check labs.

## 2024-03-02 ENCOUNTER — Telehealth: Payer: Self-pay

## 2024-03-02 ENCOUNTER — Ambulatory Visit: Payer: Self-pay | Admitting: Family Medicine

## 2024-03-02 NOTE — Telephone Encounter (Signed)
 After reviewing Dr. Charis note from her last visit, Repatha was recommended but patient was fearful of the side effects. Dr. Mahlon noted that she would reach out to patient's cardiologist on her behalf and instructed patient to also reach out to Dr. Mona to let him know she is willing to try Repatha if it's covered.   Please call patient and let her know that she needs to contact her cardiologist about starting Repatha. I am also sending this to Dr. Mahlon to inquire if there was any advice given by the cardiologist between the two of them.         Copied from CRM [id number]. Topic: Clinical - Prescription Issue >> Mar 02, 2024  9:53 AM Brenda Woods wrote: Reason for CRM: Patient states that the medication called in for her cholesterol but states she thought this was a shot that was going to be called in to the [facility] pharmacy but they do not have it. Was this supposed to be an injection? Was there something that was supposed to be called in for her?  Please call patient back for clarification [phone]

## 2024-03-02 NOTE — Telephone Encounter (Signed)
 Pt has been notified.

## 2024-03-02 NOTE — Progress Notes (Signed)
 Pt has been notified  Pt asked if she should take her medication. Advised pt to continue all medications she is currently on.

## 2024-03-02 NOTE — Progress Notes (Signed)
 Left vm to call office or via MyChart for lab results

## 2024-03-02 NOTE — Telephone Encounter (Signed)
 I don't have any information to add.  Dr Mona confirmed that he wants pt on both the Repatha injection and her daily statin.

## 2024-03-03 ENCOUNTER — Other Ambulatory Visit: Payer: Self-pay | Admitting: Family Medicine

## 2024-03-17 ENCOUNTER — Other Ambulatory Visit: Payer: Self-pay | Admitting: Neurology

## 2024-03-17 NOTE — Telephone Encounter (Signed)
 Dispensed Days Supply Quantity Provider Pharmacy  PHENOBARBITAL  64.8MG  TAB 12/04/2023 90 180 each Camara, Amadou, MD [facility] [phone] ...  PHENOBARBITAL  64.8MG  TAB 09/18/2023 80 160 each Camara, Amadou, MD [facility] [phone] ...  PHENOBARBITAL  64.8MG  TAB 06/17/2023 90 180 each Gregg Lek, MD [facility] [phone] ...  PHENOBARBITAL  64.8MG  TAB 03/22/2023 90 180 each Camara, Amadou, MD [facility] [phone] ...       Last visit 04/20/23 Next visit 04/19/24

## 2024-04-01 ENCOUNTER — Other Ambulatory Visit: Payer: Self-pay | Admitting: Family Medicine

## 2024-04-06 ENCOUNTER — Encounter: Payer: Self-pay | Admitting: Student in an Organized Health Care Education/Training Program

## 2024-04-06 ENCOUNTER — Observation Stay (HOSPITAL_BASED_OUTPATIENT_CLINIC_OR_DEPARTMENT_OTHER)
Admission: EM | Admit: 2024-04-06 | Discharge: 2024-04-07 | Disposition: A | Discharge status: Home / Self Care | Source: Ambulatory Visit | Attending: Family Medicine | Admitting: Family Medicine

## 2024-04-06 ENCOUNTER — Encounter (HOSPITAL_BASED_OUTPATIENT_CLINIC_OR_DEPARTMENT_OTHER): Payer: Self-pay | Admitting: Radiology

## 2024-04-06 ENCOUNTER — Emergency Department (HOSPITAL_BASED_OUTPATIENT_CLINIC_OR_DEPARTMENT_OTHER)

## 2024-04-06 ENCOUNTER — Observation Stay (HOSPITAL_COMMUNITY)

## 2024-04-06 ENCOUNTER — Ambulatory Visit (INDEPENDENT_AMBULATORY_CARE_PROVIDER_SITE_OTHER): Admitting: Student in an Organized Health Care Education/Training Program

## 2024-04-06 ENCOUNTER — Other Ambulatory Visit: Payer: Self-pay

## 2024-04-06 VITALS — BP 172/63 | HR 65 | Wt 107.0 lb

## 2024-04-06 DIAGNOSIS — H8190 Unspecified disorder of vestibular function, unspecified ear: Secondary | ICD-10-CM

## 2024-04-06 DIAGNOSIS — I6523 Occlusion and stenosis of bilateral carotid arteries: Secondary | ICD-10-CM | POA: Insufficient documentation

## 2024-04-06 DIAGNOSIS — R262 Difficulty in walking, not elsewhere classified: Secondary | ICD-10-CM | POA: Diagnosis not present

## 2024-04-06 DIAGNOSIS — I10 Essential (primary) hypertension: Secondary | ICD-10-CM | POA: Diagnosis not present

## 2024-04-06 DIAGNOSIS — G40309 Generalized idiopathic epilepsy and epileptic syndromes, not intractable, without status epilepticus: Secondary | ICD-10-CM | POA: Insufficient documentation

## 2024-04-06 DIAGNOSIS — T421X4A Poisoning by iminostilbenes, undetermined, initial encounter: Secondary | ICD-10-CM | POA: Diagnosis not present

## 2024-04-06 DIAGNOSIS — R531 Weakness: Secondary | ICD-10-CM | POA: Diagnosis not present

## 2024-04-06 DIAGNOSIS — R42 Dizziness and giddiness: Secondary | ICD-10-CM | POA: Diagnosis not present

## 2024-04-06 DIAGNOSIS — G9340 Encephalopathy, unspecified: Secondary | ICD-10-CM

## 2024-04-06 DIAGNOSIS — G9341 Metabolic encephalopathy: Secondary | ICD-10-CM | POA: Insufficient documentation

## 2024-04-06 DIAGNOSIS — E785 Hyperlipidemia, unspecified: Secondary | ICD-10-CM | POA: Insufficient documentation

## 2024-04-06 DIAGNOSIS — R29818 Other symptoms and signs involving the nervous system: Secondary | ICD-10-CM | POA: Diagnosis not present

## 2024-04-06 DIAGNOSIS — I639 Cerebral infarction, unspecified: Secondary | ICD-10-CM

## 2024-04-06 DIAGNOSIS — Z79899 Other long term (current) drug therapy: Secondary | ICD-10-CM | POA: Insufficient documentation

## 2024-04-06 DIAGNOSIS — T421X1A Poisoning by iminostilbenes, accidental (unintentional), initial encounter: Secondary | ICD-10-CM | POA: Diagnosis not present

## 2024-04-06 DIAGNOSIS — R110 Nausea: Secondary | ICD-10-CM | POA: Diagnosis not present

## 2024-04-06 DIAGNOSIS — Z743 Need for continuous supervision: Secondary | ICD-10-CM | POA: Diagnosis not present

## 2024-04-06 DIAGNOSIS — R519 Headache, unspecified: Secondary | ICD-10-CM | POA: Diagnosis not present

## 2024-04-06 DIAGNOSIS — E871 Hypo-osmolality and hyponatremia: Secondary | ICD-10-CM | POA: Diagnosis not present

## 2024-04-06 DIAGNOSIS — R29701 NIHSS score 1: Secondary | ICD-10-CM | POA: Diagnosis not present

## 2024-04-06 DIAGNOSIS — R410 Disorientation, unspecified: Secondary | ICD-10-CM | POA: Diagnosis not present

## 2024-04-06 DIAGNOSIS — I63239 Cerebral infarction due to unspecified occlusion or stenosis of unspecified carotid arteries: Secondary | ICD-10-CM | POA: Insufficient documentation

## 2024-04-06 DIAGNOSIS — I6501 Occlusion and stenosis of right vertebral artery: Secondary | ICD-10-CM | POA: Diagnosis not present

## 2024-04-06 DIAGNOSIS — I7774 Dissection of vertebral artery: Secondary | ICD-10-CM | POA: Diagnosis not present

## 2024-04-06 LAB — CBC WITH DIFFERENTIAL/PLATELET
Abs Immature Granulocytes: 0.01 K/uL (ref 0.00–0.07)
Basophils Absolute: 0 K/uL (ref 0.0–0.1)
Basophils Relative: 0 %
Eosinophils Absolute: 0 K/uL (ref 0.0–0.5)
Eosinophils Relative: 0 %
HCT: 30.1 % — ABNORMAL LOW (ref 36.0–46.0)
Hemoglobin: 10.8 g/dL — ABNORMAL LOW (ref 12.0–15.0)
Immature Granulocytes: 0 %
Lymphocytes Relative: 28 %
Lymphs Abs: 1 K/uL (ref 0.7–4.0)
MCH: 33.8 pg (ref 26.0–34.0)
MCHC: 35.9 g/dL (ref 30.0–36.0)
MCV: 94.1 fL (ref 80.0–100.0)
Monocytes Absolute: 0.4 K/uL (ref 0.1–1.0)
Monocytes Relative: 10 %
Neutro Abs: 2.1 K/uL (ref 1.7–7.7)
Neutrophils Relative %: 62 %
Platelets: 228 K/uL (ref 150–400)
RBC: 3.2 MIL/uL — ABNORMAL LOW (ref 3.87–5.11)
RDW: 12.4 % (ref 11.5–15.5)
WBC: 3.5 K/uL — ABNORMAL LOW (ref 4.0–10.5)
nRBC: 0 % (ref 0.0–0.2)

## 2024-04-06 LAB — COMPREHENSIVE METABOLIC PANEL WITH GFR
ALT: 25 U/L (ref 0–44)
AST: 38 U/L (ref 15–41)
Albumin: 4 g/dL (ref 3.5–5.0)
Alkaline Phosphatase: 126 U/L (ref 38–126)
Anion gap: 11 (ref 5–15)
BUN: 10 mg/dL (ref 8–23)
CO2: 24 mmol/L (ref 22–32)
Calcium: 9 mg/dL (ref 8.9–10.3)
Chloride: 88 mmol/L — ABNORMAL LOW (ref 98–111)
Creatinine, Ser: 0.4 mg/dL — ABNORMAL LOW (ref 0.44–1.00)
GFR, Estimated: >60 mL/min (ref >60)
Glucose, Bld: 106 mg/dL — ABNORMAL HIGH (ref 70–99)
Potassium: 4.2 mmol/L (ref 3.5–5.1)
Sodium: 123 mmol/L — ABNORMAL LOW (ref 135–145)
Total Bilirubin: 0.4 mg/dL (ref 0.0–1.2)
Total Protein: 6.8 g/dL (ref 6.5–8.1)

## 2024-04-06 LAB — BASIC METABOLIC PANEL WITH GFR
Anion gap: 11 (ref 5–15)
BUN: 6 mg/dL — ABNORMAL LOW (ref 8–23)
CO2: 23 mmol/L (ref 22–32)
Calcium: 8.4 mg/dL — ABNORMAL LOW (ref 8.9–10.3)
Chloride: 86 mmol/L — ABNORMAL LOW (ref 98–111)
Creatinine, Ser: 0.42 mg/dL — ABNORMAL LOW (ref 0.44–1.00)
GFR, Estimated: >60 mL/min (ref >60)
Glucose, Bld: 106 mg/dL — ABNORMAL HIGH (ref 70–99)
Potassium: 3.8 mmol/L (ref 3.5–5.1)
Sodium: 120 mmol/L — ABNORMAL LOW (ref 135–145)

## 2024-04-06 LAB — PHENOBARBITAL LEVEL: Phenobarbital: 35.8 ug/mL (ref 15.0–40.0)

## 2024-04-06 LAB — CARBAMAZEPINE LEVEL, TOTAL: Carbamazepine Lvl: 13.8 ug/mL — ABNORMAL HIGH (ref 4.0–12.0)

## 2024-04-06 LAB — OSMOLALITY: Osmolality: 256 mosm/kg — ABNORMAL LOW (ref 275–295)

## 2024-04-06 LAB — AMMONIA: Ammonia: 20 umol/L (ref 9–35)

## 2024-04-06 MED ORDER — ACETAMINOPHEN 325 MG PO TABS: 650.0000 mg | ORAL_TABLET | ORAL | Status: DC | PRN

## 2024-04-06 MED ORDER — DIAZEPAM 2 MG PO TABS: 2.0000 mg | ORAL_TABLET | Freq: Three times a day (TID) | ORAL | Status: DC | PRN

## 2024-04-06 MED ORDER — IOHEXOL 350 MG/ML SOLN
75.0000 mL | Freq: Once | INTRAVENOUS | Status: AC | PRN
  Administered 2024-04-06: 75 mL via INTRAVENOUS

## 2024-04-06 MED ORDER — ACETAMINOPHEN 325 MG PO TABS
650.0000 mg | ORAL_TABLET | Freq: Once | ORAL | Status: AC
Start: 2024-04-06 — End: 2024-04-06
  Administered 2024-04-06: 650 mg via ORAL
  Filled 2024-04-06: qty 2

## 2024-04-06 MED ORDER — ATORVASTATIN CALCIUM 40 MG PO TABS
40.0000 mg | ORAL_TABLET | Freq: Every day | ORAL | Status: DC
  Administered 2024-04-07: 40 mg via ORAL
  Filled 2024-04-06: qty 1

## 2024-04-06 MED ORDER — ACETAMINOPHEN 160 MG/5ML PO SOLN: 650.0000 mg | ORAL | Status: DC | PRN

## 2024-04-06 MED ORDER — MECLIZINE HCL 25 MG PO TABS: 25.0000 mg | ORAL_TABLET | Freq: Three times a day (TID) | ORAL | Status: DC | PRN

## 2024-04-06 MED ORDER — SENNOSIDES-DOCUSATE SODIUM 8.6-50 MG PO TABS: 1.0000 | ORAL_TABLET | Freq: Every evening | ORAL | Status: DC | PRN

## 2024-04-06 MED ORDER — METOCLOPRAMIDE HCL 5 MG/ML IJ SOLN
10.0000 mg | Freq: Once | INTRAMUSCULAR | Status: AC
Start: 2024-04-06 — End: 2024-04-06
  Administered 2024-04-06: 10 mg via INTRAVENOUS
  Filled 2024-04-06: qty 2

## 2024-04-06 MED ORDER — SODIUM CHLORIDE 0.9 % IV SOLN: INTRAVENOUS | Status: DC

## 2024-04-06 MED ORDER — LACTATED RINGERS IV BOLUS
500.0000 mL | Freq: Once | INTRAVENOUS | Status: AC
  Administered 2024-04-06: 500 mL via INTRAVENOUS

## 2024-04-06 MED ORDER — ASPIRIN 325 MG PO TBEC
650.0000 mg | DELAYED_RELEASE_TABLET | Freq: Every day | ORAL | Status: DC
  Administered 2024-04-06: 650 mg via ORAL
  Filled 2024-04-06: qty 2

## 2024-04-06 MED ORDER — CLOPIDOGREL BISULFATE 75 MG PO TABS
75.0000 mg | ORAL_TABLET | Freq: Every day | ORAL | Status: DC
  Administered 2024-04-07: 75 mg via ORAL
  Filled 2024-04-06: qty 1

## 2024-04-06 MED ORDER — ACETAMINOPHEN 650 MG RE SUPP: 650.0000 mg | RECTAL | Status: DC | PRN

## 2024-04-06 MED ORDER — ASPIRIN 81 MG PO TBEC
81.0000 mg | DELAYED_RELEASE_TABLET | Freq: Every day | ORAL | Status: DC
  Administered 2024-04-07: 81 mg via ORAL
  Filled 2024-04-06: qty 1

## 2024-04-06 MED ORDER — CLOPIDOGREL BISULFATE 300 MG PO TABS
300.0000 mg | ORAL_TABLET | Freq: Once | ORAL | Status: AC
  Administered 2024-04-06: 300 mg via ORAL
  Filled 2024-04-06: qty 1

## 2024-04-06 MED ORDER — ONDANSETRON HCL 4 MG/2ML IJ SOLN
4.0000 mg | Freq: Once | INTRAMUSCULAR | Status: AC
Start: 2024-04-06 — End: 2024-04-06
  Administered 2024-04-06: 4 mg via INTRAVENOUS
  Filled 2024-04-06: qty 2

## 2024-04-06 MED ORDER — ENOXAPARIN SODIUM 40 MG/0.4ML IJ SOSY
40.0000 mg | PREFILLED_SYRINGE | INTRAMUSCULAR | Status: DC
  Filled 2024-04-06: qty 0.4

## 2024-04-06 MED ORDER — ASPIRIN 81 MG PO CHEW: 324.0000 mg | CHEWABLE_TABLET | Freq: Once | ORAL | Status: DC

## 2024-04-06 MED ORDER — DIAZEPAM 5 MG/ML IJ SOLN
2.5000 mg | Freq: Once | INTRAMUSCULAR | Status: AC
  Administered 2024-04-06: 2.5 mg via INTRAVENOUS
  Filled 2024-04-06: qty 2

## 2024-04-06 MED ORDER — SODIUM CHLORIDE 0.9 % IV SOLN: INTRAVENOUS | Status: AC

## 2024-04-06 MED ORDER — STROKE: EARLY STAGES OF RECOVERY BOOK: Freq: Once | Status: AC

## 2024-04-06 MED ORDER — MECLIZINE HCL 25 MG PO TABS
25.0000 mg | ORAL_TABLET | Freq: Once | ORAL | Status: AC
  Administered 2024-04-06: 25 mg via ORAL
  Filled 2024-04-06: qty 1

## 2024-04-06 MED ORDER — ONDANSETRON HCL 4 MG/2ML IJ SOLN: 4.0000 mg | Freq: Four times a day (QID) | INTRAMUSCULAR | Status: DC | PRN

## 2024-04-06 MED ORDER — PHENOBARBITAL 32.4 MG PO TABS
129.6000 mg | ORAL_TABLET | Freq: Every day | ORAL | Status: DC
  Administered 2024-04-06: 129.6 mg via ORAL
  Filled 2024-04-06: qty 4

## 2024-04-06 NOTE — Patient Instructions (Signed)
  VISIT SUMMARY: During your visit, we discussed your recent experience of severe dizziness and changes in mental status. You have a history of epilepsy and have been taking your medication as prescribed. Your symptoms began this morning and have made it difficult for you to walk. We also noted that your blood pressure is elevated.  YOUR PLAN: -ACUTE DIZZINESS WITH ALTERED MENTAL STATUS: Acute dizziness and altered mental status can be caused by various conditions such as stroke, seizure, or metabolic issues. Given your symptoms and history, it is important to rule out serious conditions like a stroke. You will be referred to the hospital for further evaluation, including blood work and an MRI. Please inform the triage team at the hospital that you are being sent for a stroke evaluation.  -SEVERE ACUTE HYPERTENSION: Severe acute hypertension means that your blood pressure is very high, which can be a sign of an underlying acute condition such as a stroke. Immediate evaluation and management are necessary. You will be referred to the hospital for further evaluation and treatment.  INSTRUCTIONS: Please go to the hospital immediately for further evaluation and management. Inform the triage team that you are being sent for a stroke evaluation. Follow up with your primary care physician after your hospital visit to discuss the results and any further steps.

## 2024-04-06 NOTE — Assessment & Plan Note (Signed)
 Patient appears sedated today.  Differential diagnosis could include metabolic disturbance in a person with a history of chronic hyponatremia.  Could be a toxic effects, she takes Tegretol  but has had no recent medication changes.  She denies other sedating substance use.  Could be a structural issue, I am sending her to the emergency department for an MRI to rule out a posterior circulation cerebral infarction.  She has a history of epilepsy, could be postictal states, she denies any recent seizure activities for many years.  I counseled the patient and her sister that mental status changes like this can worsen, and I recommended they go straight to the emergency department for a workup of the underlying cause, management and monitoring.

## 2024-04-06 NOTE — Progress Notes (Signed)
 Plan of Care Note for accepted transfer  Patient: Brenda Woods    FMW:[id number]  DOA: 04/06/2024     Nursing staff, Please call [facility] Admits & Consults System-Wide number on Amion as soon as patient's arrival to the unit (not the listed attending) so that the appropriate admitting provider can evaluate the pt. ASAP to avoid any delay in care.  Facility requesting transfer: [facility] Requesting Provider: Dr. Mannie Reason for transfer: Admission Facility course: Patient presented to the med center with complaints of dizziness.  She was found to have nystagmus and ataxia.  Lab work shows elevated carbamazepine  level.  Neurology was consulted.  I recommended CTA.  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  Patient was loaded with aspirin and Plavix.  Was recommended to be admitted for further workup and neuro consultation.  Plan of care: The patient is accepted for admission to Telemetry unit, at [facility]. Please notify neuro on arrival.  Author: Yetta Blanch, MD  04/06/2024  Check [email or web address] for on-call coverage.

## 2024-04-06 NOTE — ED Notes (Signed)
 ED Provider at bedside.

## 2024-04-06 NOTE — ED Notes (Signed)
Brenda Woods with cl called for transport

## 2024-04-06 NOTE — Assessment & Plan Note (Signed)
 Recurrent episode of acute vestibular syndrome.  I evaluated her for something similar in May, MRI at that time was reassuring and the symptoms lasted a few days and then resolved spontaneously.  Symptoms today seem more severe and are now accompanied by changes in mental status.  Neuroexam is abnormal with severe gait ataxia.  The severity and timing are not consistent with simple vertigo.  She is at risk for posterior circulation cerebral infarction.  I have recommended she go to the emergency department for MRI urgently.

## 2024-04-06 NOTE — Progress Notes (Signed)
 Acute Office Visit  Subjective:     Patient ID: Brenda Woods, female    DOB: [birth year], 68 y.o.   MRN: [id number]  Chief Complaint  Patient presents with   Fatigue    Very fatigue and dizzy. Dizzy yesterday around 5-6 oclock. Woke up this morning and very dizzy and having a very hard time walking. Patient had pecan wheels and medicine at about 9 am today and that is all she has had. Patient can not remember what she had to eat yesterday.     HPI  Discussed the use of AI scribe software for clinical note transcription with the patient, who gave verbal consent to proceed.  History of Present Illness Brenda Woods is a 68 year old female with epilepsy who presents with acute dizziness and changes in mental status.  She has been experiencing severe dizziness since waking up this morning between 5 and 6 AM, describing the sensation as 'the room is going around and around,' which has made it difficult for her to walk. She recalls feeling some dizziness last night, but it was not as severe as today. She is unable to recall her activities from yesterday due to the dizziness.  She has a history of epilepsy and takes Tegretol  regularly. She confirms taking her usual dose of Tegretol  this morning and denies any changes in her medication regimen. She has not experienced any seizures recently, with her last known seizure occurring approximately 12 years ago. No use of sleep medications, alcohol, or drugs.  Her sister reports that she was fine yesterday, with normal speech and thinking. She went grocery shopping and was able to put everything away without issue.  No headache, chest pain, or breathing difficulties. She is unsure if she had a seizure in her sleep. She lives alone but has contacted her sister for assistance.      Objective:    BP (!) 172/63   Pulse 65   Wt 107 lb (48.5 kg)   SpO2 100%   BMI 19.26 kg/m   Physical Exam  Gen: Sedated appearing woman Neuro: Decreased  mentation, slow to answer questions, alert to person but not date, very slow get up and go, severe gait ataxia, truncal ataxia with even sitting, bidirectional nystagmus on lateral gaze, normal cranial nerves, full strength upper and lower extremities, patellar reflexes are brisk and prolonged Heart: Regular, no murmur Lungs: Unlabored, clear throughout Ext: Warm, no edema      Assessment & Plan:   Problem List Items Addressed This Visit       Unprioritized   Acute vestibular syndrome - Primary   Recurrent episode of acute vestibular syndrome.  I evaluated her for something similar in May, MRI at that time was reassuring and the symptoms lasted a few days and then resolved spontaneously.  Symptoms today seem more severe and are now accompanied by changes in mental status.  Neuroexam is abnormal with severe gait ataxia.  The severity and timing are not consistent with simple vertigo.  She is at risk for posterior circulation cerebral infarction.  I have recommended she go to the emergency department for MRI urgently.      Acute encephalopathy   Patient appears sedated today.  Differential diagnosis could include metabolic disturbance in a person with a history of chronic hyponatremia.  Could be a toxic effects, she takes Tegretol  but has had no recent medication changes.  She denies other sedating substance use.  Could be a structural issue, I am  sending her to the emergency department for an MRI to rule out a posterior circulation cerebral infarction.  She has a history of epilepsy, could be postictal states, she denies any recent seizure activities for many years.  I counseled the patient and her sister that mental status changes like this can worsen, and I recommended they go straight to the emergency department for a workup of the underlying cause, management and monitoring.       Cleatus Debby Specking, MD

## 2024-04-06 NOTE — Plan of Care (Addendum)
 On-call neurology note Provider location: [facility] Patient location ED [facility]  Patient with history of seizure disorder on Tegretol  amongst other comorbidities, presenting for a nearly 24-hour worth of dizziness and gait ataxia on exam.  Noted to be more hyponatremic than usual.  Tegretol  level higher than the upper limit.  She has nystagmus and gait ataxia on exam per the EDP.  Posterior circulation stroke could be a possibility but she is outside the window for IV thrombolysis.  I recommended getting a CTA head and neck and then admitting to [facility] for a formal neurological evaluation because she may need adjustment of her antiepileptics, as well as vestibular rehab.  MRI when at [facility].  She will be seen by the neurology team upon arrival to [facility]  Eligio Lav, MD Neurology

## 2024-04-06 NOTE — ED Notes (Signed)
 [facility] at bedside

## 2024-04-06 NOTE — ED Notes (Signed)
 Pt had emesis episode x1 and feeling nauseas. MD made aware.

## 2024-04-06 NOTE — H&P (Signed)
 History and Physical    Brenda Woods FMW:[id number] DOB: 05/24/1956 DOA: 04/06/2024  PCP: Mahlon Comer BRAVO, MD   Patient coming from: Home   Chief Complaint: Room-spinning sensation, nausea, headache   HPI: Brenda Woods is a 68 y.o. female with medical history significant for hyperlipidemia, epilepsy, and chronic hyponatremia, now presenting with room spinning sensation, nausea, and headache.  Patient developed mild dizziness and nausea yesterday evening.  The symptoms were much worse early this morning when she woke.  She has had difficulty ambulating today due to severe room spinning sensation.  She has also been experiencing severe nausea and a headache.  She denies any focal numbness or weakness, abdominal pain, fever, or chills.  [facility] ED Course: Upon arrival to the ED, patient is found to be afebrile and saturating well on room air with normal HR and stable BP.  Labs are most notable for sodium 123, normal creatinine, WBC 3500, hemoglobin 10.8, and Tegretol  13.8.  There are no acute findings on head CT.  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  Neurology was consulted by the ED physician, patient was loaded with aspirin and Plavix, also treated with 500 mL of LR, Zofran , Reglan, meclizine, Valium, and acetaminophen , and she was transferred to [facility] for admission.  Review of Systems:  All other systems reviewed and apart from HPI, are negative.  Past Medical History:  Diagnosis Date   Allergy    Arthritis    elbows   Cataract    Osteopenia    Seizure (HCC)    last seizure 39, onset age 19   Seizure [facility])     Past Surgical History:  Procedure Laterality Date   ANKLE FRACTURE SURGERY  07-05-08    Social History:   reports that she has never smoked. She has never used smokeless tobacco. She reports that she does not drink alcohol and does not use  drugs.  No Known Allergies  Family History  Problem Relation Age of Onset   Alzheimer's disease Father    Breast cancer Maternal Aunt    Colon cancer Neg Hx    Esophageal cancer Neg Hx    Rectal cancer Neg Hx    Stomach cancer Neg Hx      Prior to Admission medications   Medication Sig Start Date End Date Taking? Authorizing Provider  carbamazepine  (TEGRETOL  XR) 100 MG 12 hr tablet TAKE 5 TABLETS BY MOUTH TWICE DAILY 01/11/24  Yes Camara, Amadou, MD  PHENobarbital  (LUMINAL) 64.8 MG tablet TAKE 2 TABLETS BY MOUTH AT BEDTIME 03/17/24  Yes Camara, Amadou, MD  atorvastatin  (LIPITOR) 40 MG tablet Take 1 tablet by mouth once daily 04/01/24   Tabori, Katherine E, MD  Calcium  Carb-Cholecalciferol 600-10 MG-MCG TABS Take 1 tablet twice a day by oral route. 05/02/20   [provider]  Multiple Vitamins-Minerals (ONE-A-DAY 50 PLUS PO) Take by mouth.    [provider]  zinc gluconate 50 MG tablet Take 50 mg by mouth daily.    [provider]    Physical Exam: Vitals:   04/06/24 1545 04/06/24 1630 04/06/24 1715 04/06/24 1819  BP: (!) 161/80 (!) 153/73 (!) 175/73 (!) 148/70  Pulse: 62 (!) 59 65 60  Resp: 14 11 12    Temp:    97.7 F (36.5 C)  SpO2: 100% 100% 100% 100%  Weight:      Height:  Constitutional: NAD, no pallor or diaphoresis   Eyes: PERTLA, lids and conjunctivae normal ENMT: Mucous membranes are moist. Posterior pharynx clear of any exudate or lesions.   Neck: supple, no masses  Respiratory: no wheezing, no crackles. No accessory muscle use.  Cardiovascular: S1 & S2 heard, regular rate and rhythm. No extremity edema.  Abdomen: No tenderness, soft. Bowel sounds active.  Musculoskeletal: no clubbing / cyanosis. No joint deformity upper and lower extremities.   Skin: no significant rashes, lesions, ulcers. Warm, dry, well-perfused. Neurologic: CN 2-12 grossly intact. Nystagmus. Sensation to light touch intact. Strength 5/5 in all 4 limbs. Alert  and oriented.  Psychiatric: Calm. Cooperative.    Labs and Imaging on Admission: I have personally reviewed following labs and imaging studies  CBC: Recent Labs  Lab 04/06/24 1254  WBC 3.5*  NEUTROABS 2.1  HGB 10.8*  HCT 30.1*  MCV 94.1  PLT 228   Basic Metabolic Panel: Recent Labs  Lab 04/06/24 1254  NA 123*  K 4.2  CL 88*  CO2 24  GLUCOSE 106*  BUN 10  CREATININE 0.40*  CALCIUM  9.0   GFR: Estimated Creatinine Clearance: 50.1 mL/min (A) (by C-G formula based on SCr of 0.4 mg/dL (L)). Liver Function Tests: Recent Labs  Lab 04/06/24 1254  AST 38  ALT 25  ALKPHOS 126  BILITOT 0.4  PROT 6.8  ALBUMIN 4.0   No results for input(s): LIPASE, AMYLASE in the last 168 hours. No results for input(s): AMMONIA in the last 168 hours. Coagulation Profile: No results for input(s): INR, PROTIME in the last 168 hours. Cardiac Enzymes: No results for input(s): CKTOTAL, CKMB, CKMBINDEX, TROPONINI in the last 168 hours. BNP (last 3 results) No results for input(s): PROBNP in the last 8760 hours. HbA1C: No results for input(s): HGBA1C in the last 72 hours. CBG: No results for input(s): GLUCAP in the last 168 hours. Lipid Profile: No results for input(s): CHOL, HDL, LDLCALC, TRIG, CHOLHDL, LDLDIRECT in the last 72 hours. Thyroid  Function Tests: No results for input(s): TSH, T4TOTAL, FREET4, T3FREE, THYROIDAB in the last 72 hours. Anemia Panel: No results for input(s): VITAMINB12, FOLATE, FERRITIN, TIBC, IRON, RETICCTPCT in the last 72 hours. Urine analysis:    Component Value Date/Time   COLORURINE YELLOW 11/06/2013 0306   APPEARANCEUR CLEAR 11/06/2013 0306   LABSPEC 1.019 11/06/2013 0306   PHURINE 7.0 11/06/2013 0306   GLUCOSEU NEGATIVE 11/06/2013 0306   HGBUR NEGATIVE 11/06/2013 0306   BILIRUBINUR NEGATIVE 11/06/2013 0306   KETONESUR NEGATIVE 11/06/2013 0306   PROTEINUR NEGATIVE 11/06/2013 0306    UROBILINOGEN 0.2 11/06/2013 0306   NITRITE NEGATIVE 11/06/2013 0306   LEUKOCYTESUR NEGATIVE 11/06/2013 0306     EKG: Independently reviewed. Sinus rhythm, 1st degree AV block.   Assessment/Plan   1. Vertebral artery dissection  - Loaded with ASA and Plavix in ED; she passed swallow screen in ED  - Continue neuro checks, check MRI brain, continue ASA 81 mg and Plavix 75 mg daily, consult PT and OT, follow-up on any additional neurology recommendations    2. 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 Epilepsy  - Continue phenobarbital , hold Tegretol  pending neurology recommendations in light of elevated drug level   4. Hyperlipidemia  - Continue Lipitor    DVT prophylaxis: Lovenox  Code Status: Full  Level of Care: Level of care: Telemetry Medical Family Communication: Daughter at bedside  Disposition Plan:  Patient is from: Home  Anticipated d/c is to: TBD Anticipated d/c date is: 10/2 or 04/08/24 Patient currently: Pending MRI brain, therapy assessments, disposition planning  Consults called: Neurology  Admission status: Observation     Evalene GORMAN Sprinkles, MD [facility]  04/06/2024, 8:00 PM

## 2024-04-06 NOTE — Consult Note (Signed)
 Seiz semiology GTC, unsure if focal onset Follows with [facility] No new meds or changes to current meds  Resting gaze nystagmus, rotary When looking to side, changing directions in terms of addl horiz beating FTN okay if slow or dysmetria, but if close eyes then R fing to nose Deferred gait due to imbalance Bl asterixis No known kidney liver disease  No bulbar symptoms, confirmed w sister voice same PERRL, EOMI  Has had headache but says from dizziness, now it is gone Had dry heaving but now gone No hiccups  MRI pending -------------------- Dr Pastor Falling, [facility] on 04/20/2023  04/20/2023:  Patient presents today for follow-up, last visit was a year ago.  Since then, she has been doing well, no seizure or seizure like activity.  She is compliant with the carbamazepine  and phenobarbital .  Denies any side effect from the medication.  She reports her PCP recently started her on Crestor  for hyperlipidemia but she has not been taking the medication due to being fearful of potential liver side effects.  No other complaints, no other concerns.   04/14/2022:  Patient presents today for follow-up, last visit was in October 2022.  Since then she has not had any additional seizures.  She reported compliance with carbamazepine  and phenobarbital .  Denies any side effect from the medication.  No concerns.  Overall she is doing well     04/09/2021:  Mrs. Antoria Lanza presents today for follow-up for generalized epilepsy.  She was last seen on September 1 and at that time, plan was to continue with carbamazepine  500 mg twice daily and phenobarbital  129.6 mg at bedtime.  However she did have a lab work by her PMD which showed a sodium of 126.  At that time review of labs show a baseline sodium of 128-131 over the past 5 years but plan was to decrease the carbamazepine  to 400 mg twice a day and to start the patient on Keppra  250 mg twice daily.  Patient said that he took the Keppra , could not  tolerate it, took it for about 6 to 7 days but was feeling very jittery anxious therefore she discontinued.  She went back to the original dose of carbamazepine  500 mg twice a day and phenobarbital  129.6 mg.  She reported she is back to her normal self, she has been sprinkling more salt in her food.  Denies any additional side effects since discontinuing the Keppra  and no other complaint.  No seizures.       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 She has continued carbamazepine  500mg  BID and phenobarbital  129.6mg  at bedtime since last seizure in 2015. Prior to 2015, she had been on phenobarbital  and carbamazepine  400mg  BID for as far back as she can remember (probably since 1990). Last seen 08/2020 and doing well on current regimen. She was seen by PCP 03/04/2021. Labs revealed sodium level of 126. Baseline 128-131 over past 5 years. CBC 3.71, HGB 12. Liver enzymes and GGT normal.     She has tolerated regimen for many years and very hesitant to switch AED. She denies concerns of mentation changes, weakness or unusual fatigue. She lost her husband and now lives alone. She is driving without difficulty. She is able to manage medications and perform ADLs independently. She does not remember ever taking levetiracetam  in the past.    08/08/2020 ALL: She returns for seizure follow up. She continues carbamazepine  500mg  BID and phenobarbital  129.6mg  at bedtime. She is doing  very well. No seizure activity. She continues to work. She does not have PCP.    08/08/2019 ALL:  Mckensi Redinger is a 68 y.o. female here today for follow up for seizure. She continues carbamazepine  500mg  twice daily and phenobarbital  129.6mg  at bedtime (two 64.8mg  tablets). She denies recent seizure activity or missed doses of medication. She denies obvious adverse effects. She is staying well hydrated. She does not have an established PCP. She reports a history of white coat syndrome. She does not check  BP at home. She denies chest pain, shortness or breath, dizziness or headaches.    HISTORY: (copied from [employer] note on 08/02/2018)   Ms. Zody, 68 year old female returns for yearly followup,  with a history of seizure disorder with last seizure occurring in May 2015 and then previously in 1990.  Her last seizure occurred  in the grocery store and was reaching for something when she started having jerking of her extremities. 911 was called. She has no recollection of the events. According to the hospital record she was dehydrated. Her Tegretol  dose was increased to 500 mg twice daily. She is also on phenobarbital .  On return visit today she has not had recurrent seizure activity, she needs refills on her medications and labs. No new  neurologic complaints.  No interval medical issues.  She continues to work full-time. She continues to drive a car without difficulty   HISTORY: She had a history of seizure disorder that is well controlled on Tegretol  and Phenobarbital . Her last seizure occurred in 1990. Denies side effects to her medication. She had been followed at [facility] since 1968 with onset of seizures at age 11. She has had multiple trials of medications. She has no other significant medical problems.   Overall doing well, no seizures, no side effects from the medication. Last sodium level in August 128 in August 2024, baseline 128-131. At this time, will continue current medications, if there is worsening of Na level, will recommend starting salt tab. Also advised patient to restart crestor  due to hyperlipidemia     Plan Continue with Carbamazepine  500 mg BID  Continue with Phenobarb 129.6 mg nightly Will obtain lab work  In the meantime, if there is worsening of Na level, will recommend starting salt tab Follow up in 1 year.   ----------------- #1 Dizziness, ataxia, nystagmus -> R vert diss Loaded with ASA & plavix  Also has seizures on Tegretol  that was supra-therapeutic so hold  overnight, low sodium - 13.8, was 8.2 a year ago - Na 123, was 127 on 02/29/24, has been in 127-129 range for the past year --------------------  On-call neurology note Provider location: [facility] Patient location [facility]   Patient with history of seizure disorder on Tegretol  amongst other comorbidities, presenting for a nearly 24-hour worth of dizziness and gait ataxia on exam.  Noted to be more hyponatremic than usual.  Tegretol  level higher than the upper limit.   She has nystagmus and gait ataxia on exam per the EDP.   Posterior circulation stroke could be a possibility but she is outside the window for IV thrombolysis.   I recommended getting a CTA head and neck and then admitting to [facility] for a formal neurological evaluation because she may need adjustment of her antiepileptics, as well as vestibular rehab.  MRI when at [facility].   She will be seen by the neurology team upon arrival to [facility]   Eligio Lav, MD Neurology

## 2024-04-06 NOTE — ED Triage Notes (Signed)
 Pt via pov from home with dizziness that began last evening around 1730-1800. Pt is confused a bit, but her sister is with her and filled in blanks. Pt went to pcp, who sent her here. Pt a&o x 4; denies pain. No neuro deficits in triage. NAD Noted.

## 2024-04-06 NOTE — ED Provider Notes (Signed)
 Brenda Woods   CSN: [id number] Arrival date & time: 04/06/24  1158     Patient presents with: Dizziness   Brenda Woods is a 68 y.o. female.   HPI 68 year old female presents with dizziness.  History is from patient and friend.  Last night started having some mild dizziness around 5:30 PM.  Was dizzy when she went to bed.  However when she got up this morning to urinate she was very dizzy.  She is having significant trouble walking.  Maybe a little bit of blurry vision but no double vision.  There is no headache or focal weakness.  No numbness.  She is on Tegretol  and has been compliant but denies any seizure-like activity.  Saw her PCP today and was sent to the ER for further evaluation.  No ear symptoms.  Prior to Admission medications   Medication Sig Start Date End Date Taking? Authorizing Provider  atorvastatin  (LIPITOR) 40 MG tablet Take 1 tablet by mouth once daily 04/01/24   Tabori, Katherine E, MD  Calcium  Carb-Cholecalciferol 600-10 MG-MCG TABS Take 1 tablet twice a day by oral route. 05/02/20   [provider]  carbamazepine  (TEGRETOL  XR) 100 MG 12 hr tablet TAKE 5 TABLETS BY MOUTH TWICE DAILY 01/11/24   Camara, Amadou, MD  Multiple Vitamins-Minerals (ONE-A-DAY 50 PLUS PO) Take by mouth.    [provider]  PHENobarbital  (LUMINAL) 64.8 MG tablet TAKE 2 TABLETS BY MOUTH AT BEDTIME 03/17/24   Gregg Lek, MD  zinc gluconate 50 MG tablet Take 50 mg by mouth daily.    [provider]    Allergies: Patient has no known allergies.    Review of Systems  Eyes:  Positive for visual disturbance.  Gastrointestinal:  Negative for nausea and vomiting.  Musculoskeletal:  Positive for gait problem.  Neurological:  Positive for dizziness. Negative for weakness, numbness and headaches.    Updated Vital Signs BP (!) 175/71   Pulse 60   Temp (!) 97.5 F (36.4 C)   Resp 14   Ht 5' 2.25 (1.581 m)    Wt 48.5 kg   SpO2 100%   BMI 19.40 kg/m   Physical Exam Vitals and nursing Woods reviewed.  Constitutional:      General: She is not in acute distress.    Appearance: She is well-developed. She is not ill-appearing or diaphoretic.  HENT:     Head: Normocephalic and atraumatic.  Eyes:     Pupils: Pupils are equal, round, and reactive to light.     Comments: Multidirectional nystagmus bilaterally  Cardiovascular:     Rate and Rhythm: Normal rate and regular rhythm.     Heart sounds: Normal heart sounds.  Pulmonary:     Effort: Pulmonary effort is normal.     Breath sounds: Normal breath sounds.  Abdominal:     Palpations: Abdomen is soft.     Tenderness: There is no abdominal tenderness.  Skin:    General: Skin is warm and dry.  Neurological:     Mental Status: She is alert.     Comments: CN 3-12 grossly intact. 5/5 strength in all 4 extremities. Grossly normal sensation. Normal finger to nose.      (all labs ordered are listed, but only abnormal results are displayed) Labs Reviewed  COMPREHENSIVE METABOLIC PANEL WITH GFR - Abnormal; Notable for the following components:      Result Value   Sodium 123 (*)    Chloride 88 (*)  Glucose, Bld 106 (*)    Creatinine, Ser 0.40 (*)    All other components within normal limits  CBC WITH DIFFERENTIAL/PLATELET - Abnormal; Notable for the following components:   WBC 3.5 (*)    RBC 3.20 (*)    Hemoglobin 10.8 (*)    HCT 30.1 (*)    All other components within normal limits  CARBAMAZEPINE  LEVEL, TOTAL - Abnormal; Notable for the following components:   Carbamazepine  Lvl 13.8 (*)    All other components within normal limits    EKG: EKG Interpretation Date/Time:  Wednesday April 06 2024 14:08:43 EDT Ventricular Rate:  58 PR Interval:  227 QRS Duration:  100 QT Interval:  409 QTC Calculation: 402 R Axis:   70  Text Interpretation: Sinus rhythm Prolonged PR interval no acute ST/T changes Confirmed by Freddi Hamilton  [phone]) on 04/06/2024 2:17:31 PM  Radiology: CT Head Wo Contrast Result Date: 04/06/2024 CLINICAL DATA:  Dizziness and confusion since last night. No injury. EXAM: CT HEAD WITHOUT CONTRAST TECHNIQUE: Contiguous axial images were obtained from the base of the skull through the vertex without intravenous contrast. RADIATION DOSE REDUCTION: This exam was performed according to the departmental dose-optimization program which includes automated exposure control, adjustment of the mA and/or kV according to patient size and/or use of iterative reconstruction technique. COMPARISON:  09/06/2013 FINDINGS: Brain: Ventricles, cisterns and other CSF spaces is are normal. There is minimal chronic ischemic microvascular disease. No mass, mass effect, shift of midline structures or acute hemorrhage. No evidence of acute infarction. Vascular: No hyperdense vessel or unexpected calcification. Skull: Normal. Negative for fracture or focal lesion. Sinuses/Orbits: Orbits are normal.  Paranasal sinuses are clear. Other: None. IMPRESSION: 1. No acute findings. 2. Minimal chronic ischemic microvascular disease. Electronically Signed   By: Toribio Agreste M.D.   On: 04/06/2024 14:08     Procedures   Medications Ordered in the ED  lactated ringers bolus 500 mL (has no administration in time range)  meclizine (ANTIVERT) tablet 25 mg (25 mg Oral Given 04/06/24 1253)                                    Medical Decision Making Amount and/or Complexity of Data Reviewed Independent Historian: friend External Data Reviewed: notes.    Details: PCP notes earlier from today Labs: ordered.    Details: Acute on chronic hyponatremia.  Supratherapeutic level of carbamazepine  Radiology: ordered and independent interpretation performed.    Details: No head bleed ECG/medicine tests: ordered and independent interpretation performed.    Details: Sinus rhythm  Risk OTC drugs. Prescription drug management. Decision regarding  hospitalization.   Patient has nystagmus and difficulty walking.  She is not altered.  No somnolence.  I did give her some Antivert but it did not really help.  She will be given some gentle fluids but based on initial workup I suspect this is probably from an elevated Tegretol  level.  She will still need an MRI.  I discussed with neurology, Dr. Voncile, who agrees with supportive care such as fluids and ask for a CTA head and neck but will need transfer to [facility] for admission and MRI and neuro consult.  Hospitalist consult is still pending.  Patient care transferred to Dr. Mannie.  She did develop some vomiting so she was given some Zofran  and will try some Valium for her vertigo symptoms.     Final diagnoses:  Tegretol   toxicity, accidental or unintentional, initial encounter    ED Discharge Orders     None          Freddi Hamilton, MD 04/06/24 1505

## 2024-04-07 ENCOUNTER — Observation Stay (HOSPITAL_COMMUNITY)

## 2024-04-07 ENCOUNTER — Other Ambulatory Visit (HOSPITAL_COMMUNITY): Payer: Self-pay

## 2024-04-07 DIAGNOSIS — R569 Unspecified convulsions: Secondary | ICD-10-CM | POA: Diagnosis not present

## 2024-04-07 DIAGNOSIS — G459 Transient cerebral ischemic attack, unspecified: Secondary | ICD-10-CM | POA: Diagnosis not present

## 2024-04-07 DIAGNOSIS — I7774 Dissection of vertebral artery: Secondary | ICD-10-CM | POA: Diagnosis not present

## 2024-04-07 DIAGNOSIS — T421X4A Poisoning by iminostilbenes, undetermined, initial encounter: Secondary | ICD-10-CM | POA: Diagnosis not present

## 2024-04-07 DIAGNOSIS — E785 Hyperlipidemia, unspecified: Secondary | ICD-10-CM | POA: Diagnosis not present

## 2024-04-07 LAB — BASIC METABOLIC PANEL WITH GFR
Anion gap: 8 (ref 5–15)
Anion gap: 11 (ref 5–15)
Anion gap: 9 (ref 5–15)
Anion gap: 10 (ref 5–15)
BUN: 7 mg/dL — ABNORMAL LOW (ref 8–23)
BUN: 9 mg/dL (ref 8–23)
BUN: 10 mg/dL (ref 8–23)
BUN: 9 mg/dL (ref 8–23)
CO2: 22 mmol/L (ref 22–32)
CO2: 20 mmol/L — ABNORMAL LOW (ref 22–32)
CO2: 22 mmol/L (ref 22–32)
CO2: 22 mmol/L (ref 22–32)
Calcium: 8.3 mg/dL — ABNORMAL LOW (ref 8.9–10.3)
Calcium: 8 mg/dL — ABNORMAL LOW (ref 8.9–10.3)
Calcium: 7.9 mg/dL — ABNORMAL LOW (ref 8.9–10.3)
Calcium: 7.9 mg/dL — ABNORMAL LOW (ref 8.9–10.3)
Chloride: 90 mmol/L — ABNORMAL LOW (ref 98–111)
Chloride: 89 mmol/L — ABNORMAL LOW (ref 98–111)
Chloride: 92 mmol/L — ABNORMAL LOW (ref 98–111)
Chloride: 92 mmol/L — ABNORMAL LOW (ref 98–111)
Creatinine, Ser: 0.41 mg/dL — ABNORMAL LOW (ref 0.44–1.00)
Creatinine, Ser: 0.5 mg/dL (ref 0.44–1.00)
Creatinine, Ser: 0.6 mg/dL (ref 0.44–1.00)
Creatinine, Ser: 0.56 mg/dL (ref 0.44–1.00)
GFR, Estimated: >60 mL/min (ref >60)
GFR, Estimated: >60 mL/min (ref >60)
GFR, Estimated: >60 mL/min (ref >60)
GFR, Estimated: >60 mL/min (ref >60)
Glucose, Bld: 79 mg/dL (ref 70–99)
Glucose, Bld: 93 mg/dL (ref 70–99)
Glucose, Bld: 112 mg/dL — ABNORMAL HIGH (ref 70–99)
Glucose, Bld: 81 mg/dL (ref 70–99)
Potassium: 3.8 mmol/L (ref 3.5–5.1)
Potassium: 3.5 mmol/L (ref 3.5–5.1)
Potassium: 3.4 mmol/L — ABNORMAL LOW (ref 3.5–5.1)
Potassium: 3.5 mmol/L (ref 3.5–5.1)
Sodium: 120 mmol/L — ABNORMAL LOW (ref 135–145)
Sodium: 120 mmol/L — ABNORMAL LOW (ref 135–145)
Sodium: 123 mmol/L — ABNORMAL LOW (ref 135–145)
Sodium: 124 mmol/L — ABNORMAL LOW (ref 135–145)

## 2024-04-07 LAB — URINALYSIS, ROUTINE W REFLEX MICROSCOPIC
Glucose, UA: NEGATIVE mg/dL
Hgb urine dipstick: NEGATIVE
Ketones, ur: NEGATIVE mg/dL
Nitrite: NEGATIVE
Protein, ur: 30 mg/dL — AB
Specific Gravity, Urine: 1.027 (ref 1.005–1.030)
WBC, UA: >50 WBC/hpf (ref 0–5)
pH: 5 (ref 5.0–8.0)

## 2024-04-07 LAB — CBC
HCT: 29.4 % — ABNORMAL LOW (ref 36.0–46.0)
Hemoglobin: 10.5 g/dL — ABNORMAL LOW (ref 12.0–15.0)
MCH: 33.3 pg (ref 26.0–34.0)
MCHC: 35.7 g/dL (ref 30.0–36.0)
MCV: 93.3 fL (ref 80.0–100.0)
Platelets: 230 K/uL (ref 150–400)
RBC: 3.15 MIL/uL — ABNORMAL LOW (ref 3.87–5.11)
RDW: 12.5 % (ref 11.5–15.5)
WBC: 2.7 K/uL — ABNORMAL LOW (ref 4.0–10.5)
nRBC: 0 % (ref 0.0–0.2)

## 2024-04-07 LAB — ECHOCARDIOGRAM COMPLETE
AR max vel: 1.35 cm2
AV Area VTI: 1.41 cm2
AV Area mean vel: 1.22 cm2
AV Mean grad: 4 mmHg
AV Peak grad: 7.8 mmHg
Ao pk vel: 1.4 m/s
Area-P 1/2: 3.81 cm2
Calc EF: 68.4 %
Height: 62.25 in
S' Lateral: 2.2 cm
Single Plane A2C EF: 67.4 %
Single Plane A4C EF: 71 %
Weight: 1710.77 [oz_av]

## 2024-04-07 LAB — LIPID PANEL
Cholesterol: 200 mg/dL (ref 0–200)
HDL: 86 mg/dL (ref >40)
LDL Cholesterol: 108 mg/dL — ABNORMAL HIGH (ref 0–99)
Total CHOL/HDL Ratio: 2.3 ratio
Triglycerides: 28 mg/dL (ref <150)
VLDL: 6 mg/dL (ref 0–40)

## 2024-04-07 LAB — CARBAMAZEPINE LEVEL, TOTAL: Carbamazepine Lvl: 5.7 ug/mL (ref 4.0–12.0)

## 2024-04-07 LAB — RAPID URINE DRUG SCREEN, HOSP PERFORMED
Amphetamines: NOT DETECTED
Barbiturates: POSITIVE — AB
Benzodiazepines: POSITIVE — AB
Cocaine: NOT DETECTED
Opiates: NOT DETECTED
Tetrahydrocannabinol: NOT DETECTED

## 2024-04-07 LAB — HEMOGLOBIN A1C
Hgb A1c MFr Bld: 4.9 % (ref 4.8–5.6)
Mean Plasma Glucose: 93.93 mg/dL

## 2024-04-07 LAB — OSMOLALITY, URINE: Osmolality, Ur: 540 mosm/kg (ref 300–900)

## 2024-04-07 LAB — HIV ANTIBODY (ROUTINE TESTING W REFLEX): HIV Screen 4th Generation wRfx: NONREACTIVE

## 2024-04-07 LAB — SODIUM, URINE, RANDOM: Sodium, Ur: <30 mmol/L

## 2024-04-07 MED ORDER — CLOPIDOGREL BISULFATE 75 MG PO TABS
75.0000 mg | ORAL_TABLET | Freq: Every day | ORAL | 0 refills | Status: DC
  Filled 2024-04-07: qty 19, 19d supply, fill #0

## 2024-04-07 MED ORDER — CARBAMAZEPINE ER 200 MG PO TB12
500.0000 mg | ORAL_TABLET | Freq: Two times a day (BID) | ORAL | Status: DC
  Administered 2024-04-07: 500 mg via ORAL
  Filled 2024-04-07: qty 1

## 2024-04-07 MED ORDER — ASPIRIN 81 MG PO TBEC
81.0000 mg | DELAYED_RELEASE_TABLET | Freq: Every day | ORAL | 0 refills | Status: AC
  Filled 2024-04-07: qty 90, 90d supply, fill #0

## 2024-04-07 MED ORDER — SODIUM CHLORIDE 1 G PO TABS
1.0000 g | ORAL_TABLET | Freq: Two times a day (BID) | ORAL | 0 refills | Status: AC
  Filled 2024-04-07: qty 60, 30d supply, fill #0

## 2024-04-07 NOTE — TOC Transition Note (Signed)
 Transition of Care [facility]) - Discharge Note   Patient Details  Name: Brenda Woods MRN: [id number] Date of Birth: 1955/11/09  [facility] CM/SW Contact:  Andrez JULIANNA George, RN Phone Number: 04/07/2024, 3:42 PM   Clinical Narrative:    Brenda Woods is a 68 y.o. female with medical history significant for hyperlipidemia, epilepsy, and chronic hyponatremia, now presenting with room spinning sensation, nausea, and headache.   Pt is discharging home.  Pt is from home alone. Her sister lives near by and is able to assist her as needed.  Pt still drives but sister can assist as needed.  She denies issues with her home medications.  Pt states she has a walker at home.  Outpatient therapy arranged through [facility]. Information on the AVS. Pt will call to schedule the first appointment. Pt has transport home.  Final next level of care: OP Rehab Barriers to Discharge: No Barriers Identified   Patient Goals and CMS Choice     Choice offered to / list presented to : Patient      Discharge Placement                       Discharge Plan and Services Additional resources added to the After Visit Summary for                                       Social Drivers of Health (SDOH) Interventions SDOH Screenings   Food Insecurity: No Food Insecurity (04/06/2024)  Housing: Low Risk  (04/06/2024)  Transportation Needs: No Transportation Needs (04/06/2024)  Utilities: Not At Risk (04/06/2024)  Alcohol Screen: Low Risk  (05/07/2023)  Depression (PHQ2-9): Low Risk  (05/07/2023)  Financial Resource Strain: Low Risk  (05/07/2023)  Physical Activity: Sufficiently Active (05/07/2023)  Social Connections: Socially Isolated (04/06/2024)  Stress: No Stress Concern Present (05/07/2023)  Tobacco Use: Low Risk  (04/06/2024)  Health Literacy: Adequate Health Literacy (05/07/2023)     Readmission Risk Interventions     No data to display

## 2024-04-07 NOTE — Evaluation (Signed)
 Occupational Therapy Evaluation Patient Details Name: Brenda Woods MRN: [id number] DOB: 08-03-1955 Today's Date: 04/07/2024   History of Present Illness   68 y.o. female presents to [facility] 04/06/24 with mild dizziness and nausea. CT head negative. 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 PMHx:  hyperlipidemia, epilepsy, peripheral vertigo, and chronic hyponatremia     Clinical Impressions Pt admitted for the above details. PTA, she lived alone and was indep with ADLs, iADLs, and driving. Sister can provide PRN assist upon d/c. Pt presenting today with intact strength and sensation in BUE; observed mild deficits in R>L UE dysdiadochokinesia, however did not overtly impact her functional performance. Functionally, she was no moe than supervision for UB/LB ADLs and CGA/supervision for functional transfers and mobility with and without RW. Defer to PT eval for further details. Educated on s/s acute neurological changes, general safety & falls prevention, and safe home setup.   Pt is functioning at a level that is adequate for discharge from an OT standpoint. No further acute OT needs nor post-acute OT needs at this time.      If plan is discharge home, recommend the following:   Assistance with cooking/housework;Assist for transportation     Functional Status Assessment         Equipment Recommendations   None recommended by OT     Recommendations for Other Services         Precautions/Restrictions   Precautions Precautions: Fall Recall of Precautions/Restrictions: Intact Restrictions Weight Bearing Restrictions Per Provider Order: No     Mobility Bed Mobility Overal bed mobility: Modified Independent                  Transfers Overall transfer level: Needs assistance Equipment used: Rolling walker (2 wheels) Transfers: Sit to/from Stand Sit to Stand: Supervision           General transfer comment: general safety  gues      Balance Overall balance assessment: Mild deficits observed, not formally tested                                         ADL either performed or assessed with clinical judgement   ADL Overall ADL's : Needs assistance/impaired Eating/Feeding: Independent   Grooming: Supervision/safety;Wash/dry hands;Wash/dry face;Standing Grooming Details (indicate cue type and reason): able to open and manipulate bottles with minimal effort             Lower Body Dressing: Supervision/safety;Sitting/lateral leans   Toilet Transfer: Supervision/safety;Ambulation;Regular Toilet   Toileting- Clothing Manipulation and Hygiene: Supervision/safety;Sitting/lateral lean               Vision Baseline Vision/History: 0 No visual deficits (hx lasik eye sx per patient) Ability to See in Adequate Light: 0 Adequate Patient Visual Report: No change from baseline Vision Assessment?: No apparent visual deficits     Perception         Praxis         Pertinent Vitals/Pain Pain Assessment Pain Assessment: No/denies pain     Extremity/Trunk Assessment Upper Extremity Assessment Upper Extremity Assessment: Overall WFL for tasks assessed   Lower Extremity Assessment Lower Extremity Assessment: Generalized weakness   Cervical / Trunk Assessment Cervical / Trunk Assessment: Normal   Communication Communication Communication: No apparent difficulties   Cognition Arousal: Alert Behavior During Therapy: WFL for tasks assessed/performed Cognition: No apparent impairments  OT - Cognition Comments: pt with great verbal reasoning & insight into falls prevention/general safety; very pleasant & participatory                 Following commands: Intact       Cueing  General Comments   Cueing Techniques: Verbal cues  no family present; pt very participatory & pleasant   Exercises     Shoulder Instructions      Home Living Family/patient  expects to be discharged to:: Private residence Living Arrangements: Alone Available Help at Discharge: Family;Available 24 hours/day Type of Home: House Home Access: Stairs to enter [employer] of Steps: 1 STE Entrance Stairs-Rails: None Home Layout: One level     Bathroom Shower/Tub: Producer, television/film/video: Handicapped height     Home Equipment: Shower seat - built in;Hand held shower head          Prior Functioning/Environment Prior Level of Function : Independent/Modified Independent;Driving             Mobility Comments: Ind with no AD, denies falls ADLs Comments: manages meds via pillbox (working on obtaining pillbox); + driving    OT Problem List: Decreased strength;Impaired balance (sitting and/or standing)   OT Treatment/Interventions:        OT Goals(Current goals can be found in the care plan section)   Acute Rehab OT Goals Patient Stated Goal: to return home OT Goal Formulation: With patient   OT Frequency:       Co-evaluation PT/OT/SLP Co-Evaluation/Treatment: Yes Reason for Co-Treatment: To address functional/ADL transfers PT goals addressed during session: Balance;Proper use of DME;Mobility/safety with mobility OT goals addressed during session: ADL's and self-care      AM-PAC OT 6 Clicks Daily Activity     Outcome Measure Help from another person eating meals?: None Help from another person taking care of personal grooming?: None Help from another person toileting, which includes using toliet, bedpan, or urinal?: None Help from another person bathing (including washing, rinsing, drying)?: A Little Help from another person to put on and taking off regular upper body clothing?: None Help from another person to put on and taking off regular lower body clothing?: A Little 6 Click Score: 22   End of Session Equipment Utilized During Treatment: Rolling walker (2 wheels)  Activity Tolerance: Patient tolerated  treatment well Patient left: in chair;with call bell/phone within reach;with chair alarm set  OT Visit Diagnosis: Unsteadiness on feet (R26.81);Ataxia, unspecified (R27.0)                Time: 9173-9093 OT Time Calculation (min): 40 min Charges:  OT General Charges $OT Visit: 1 Visit OT Evaluation $OT Eval Low Complexity: 1 Low OT Treatments $Therapeutic Activity: 8-22 mins  Haward Pope D., MSOT, OTR/L Acute Rehabilitation Services [phone] Secure Chat Preferred  Rikki Milch 04/07/2024, 11:21 AM

## 2024-04-07 NOTE — Evaluation (Signed)
 Physical Therapy Evaluation Patient Details Name: Brenda Woods MRN: [id number] DOB: 1955-11-04 Today's Date: 04/07/2024  History of Present Illness  68 y.o. female presents to [facility] 04/06/24 with mild dizziness and nausea. CT head negative. 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 PMHx:  hyperlipidemia, epilepsy, peripheral vertigo, and chronic hyponatremia   Clinical Impression  Pt in bed upon arrival and agreeable to PT eval. PTA, pt was independent for mobility with no AD. In today's session, pt required supervision to ambulate with a RW and CGA with no AD. Pt was slightly unsteady with no AD with shorter step length and pt drifting right/left. Discussed using RW for safety and to improve balance with pt verbalizing understanding. Pt denied symptoms of dizziness/lightheadedness throughout session. Pt has 24/7 level of assist available at home. Recommending post-acute OP PT to work on balance and independence with mobility. Pt would benefit from acute skilled PT with current functional limitations listed below (see PT Problem List). Acute PT to follow.     5xSTS- 20.23 seconds    If plan is discharge home, recommend the following: A little help with walking and/or transfers;A little help with bathing/dressing/bathroom;Assist for transportation;Help with stairs or ramp for entrance   Can travel by private vehicle    Yes    Equipment Recommendations Rolling walker (2 wheels) (youth)     Functional Status Assessment Patient has had a recent decline in their functional status and demonstrates the ability to make significant improvements in function in a reasonable and predictable amount of time.     Precautions / Restrictions Precautions Precautions: Fall Recall of Precautions/Restrictions: Impaired Restrictions Weight Bearing Restrictions Per Provider Order: No      Mobility  Bed Mobility Overal bed mobility: Modified Independent      Transfers Overall transfer level: Needs assistance Equipment used: Rolling walker (2 wheels) Transfers: Sit to/from Stand Sit to Stand: Supervision    General transfer comment: for safety    Ambulation/Gait Ambulation/Gait assistance: Supervision, Contact guard assist Gait Distance (Feet): 300 Feet Assistive device: Rolling walker (2 wheels) Gait Pattern/deviations: Step-through pattern, Decreased stride length, Decreased dorsiflexion - left, Decreased dorsiflexion - right Gait velocity: decr    General Gait Details: increased stability with RW with increased step length. CGA with no AD with pt slightly unsteady  Stairs Stairs: Yes Stairs assistance: Contact guard assist Stair Management: No rails, Step to pattern, Forwards Number of Stairs: 3 General stair comments: CGA for safety with no AD     Balance Overall balance assessment: Mild deficits observed, not formally tested         Pertinent Vitals/Pain Pain Assessment Pain Assessment: No/denies pain    Home Living Family/patient expects to be discharged to:: Private residence Living Arrangements: Alone Available Help at Discharge: Family;Available 24 hours/day Type of Home: House Home Access: Stairs to enter Entrance Stairs-Rails: None Entrance Stairs-Number of Steps: 1   Home Layout: One level Home Equipment: Shower seat - built in;Hand held shower head      Prior Function Prior Level of Function : Independent/Modified Independent;Driving    Mobility Comments: Ind with no AD, denies falls ADLs Comments: Ind with medication management, getting pill box. Ind with ADLs and iADLs     Extremity/Trunk Assessment   Upper Extremity Assessment Upper Extremity Assessment: Defer to OT evaluation    Lower Extremity Assessment Lower Extremity Assessment: Generalized weakness    Cervical / Trunk Assessment Cervical / Trunk Assessment: Normal  Communication   Communication Communication: No  apparent  difficulties    Cognition Arousal: Alert Behavior During Therapy: WFL for tasks assessed/performed   PT - Cognitive impairments: No apparent impairments    Following commands: Intact       Cueing Cueing Techniques: Verbal cues, Tactile cues      PT Assessment Patient needs continued PT services  PT Problem List Decreased strength;Decreased activity tolerance;Decreased balance;Decreased mobility       PT Treatment Interventions DME instruction;Gait training;Stair training;Functional mobility training;Therapeutic activities;Therapeutic exercise;Balance training;Neuromuscular re-education;Patient/family education    PT Goals (Current goals can be found in the Care Plan section)  Acute Rehab PT Goals Patient Stated Goal: to go home PT Goal Formulation: With patient Time For Goal Achievement: 04/21/24 Potential to Achieve Goals: Good    Frequency Min 1X/week     Co-evaluation   Reason for Co-Treatment: To address functional/ADL transfers PT goals addressed during session: Balance;Proper use of DME;Mobility/safety with mobility         AM-PAC PT 6 Clicks Mobility  Outcome Measure Help needed turning from your back to your side while in a flat bed without using bedrails?: None Help needed moving from lying on your back to sitting on the side of a flat bed without using bedrails?: None Help needed moving to and from a bed to a chair (including a wheelchair)?: A Little Help needed standing up from a chair using your arms (e.g., wheelchair or bedside chair)?: A Little Help needed to walk in hospital room?: A Little Help needed climbing 3-5 steps with a railing? : A Little 6 Click Score: 20    End of Session Equipment Utilized During Treatment: Gait belt Activity Tolerance: Patient tolerated treatment well Patient left: with call bell/phone within reach;in chair;with chair alarm set Nurse Communication: Mobility status PT Visit Diagnosis: Unsteadiness on feet  (R26.81);Other abnormalities of gait and mobility (R26.89);Muscle weakness (generalized) (M62.81)    Time: 9174-9095 PT Time Calculation (min) (ACUTE ONLY): 39 min   Charges:   PT Evaluation $PT Eval Low Complexity: 1 Low PT Treatments $Gait Training: 8-22 mins PT General Charges $$ ACUTE PT VISIT: 1 Visit       Kate ORN, PT, DPT Secure Chat Preferred  Rehab Office [phone]   Kate BRAVO Wendolyn 04/07/2024, 9:51 AM

## 2024-04-07 NOTE — Progress Notes (Signed)
  Echocardiogram 2D Echocardiogram has been performed.  [name] 04/07/2024, 10:59 AM

## 2024-04-07 NOTE — Discharge Summary (Signed)
 Physician Discharge Summary  [name]:[id number] DOB: Apr 11, 1956 DOA: 04/06/2024  PCP: Mahlon Comer BRAVO, MD  Admit date: 04/06/2024 Discharge date: 04/07/2024  Time spent: 40 minutes  Recommendations for Outpatient Follow-up:  Follow outpatient CBC/CMP  Follow with neurology outpatient regarding seizures/tegretol  dosing - consider pill box for meds given concern for tegretol  dose error  Follow with neurology outpatient regarding TIA Follow with PCP or neurology for repeat labs (attention to sodium) with fluid restriction and salt tablets Consider checking cortisol Follow with vascular outpatient   Discharge Diagnoses:  Principal Problem:   Vertebral artery dissection Active Problems:   Hyponatremia   Hyperlipidemia   Nonintractable generalized idiopathic epilepsy without status epilepticus (HCC)   Discharge Condition: stable  Diet recommendation: heart healthy  Filed Weights   04/06/24 1225  Weight: 48.5 kg    History of present illness:    Brenda Woods is Brenda Woods 68 y.o. female with medical history significant for hyperlipidemia, epilepsy, and chronic hyponatremia, now presenting with room spinning sensation, nausea, and headache.   She was admitted with concern for stroke.  In addition concern for carbamazepine  toxicity and hyponatremia.  MRI was negative for stroke.  Neurology suspects her symptoms were related to carbamazepine  toxicity or TIA.  Her hyponatremia also may have contributed.    She's back to baseline on the day of discharge.  Tegretol  level has normalized.  Resume home dose of tegretol .  DAPTx3 weeks followed by aspirin alone, continue statin.  For hyponatremia, salt tabs and fluid restrcition at discharge.  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  Home lipitor.    Seizure Disorder Carbamazepine  Toxicity She may have taken extra doses - consider pill box at home Repeat level improved to normal Plan to resume home carbamazepine  and follow with her neurologist outpatient as scheduled Phenobarbital    Euvolemic Hyponatremia  Chronic Hyponatremia Chronic hyponatremia, noted to be 127 in Feb, May, and August Suspect related in part to tegretol .  Urine sodium on presentation was <30, maybe hypovolemic as well on presentation, but seems euvolemic on my exam today (worked with therapy without LH/dizziness as well).   Salt tabs plus fluid restriction at discharge (has been on salt tabs in past) Has had normal TSH in the past.  No cortisol that I see in chart, consider checking this outpatient. Sodium is improved (124 this PM), though not quite to baseline - given hx of chronic hyponatremia, I'm ok with discharge with her improved symptoms with recommendations for close follow up.  Carotid Stenosis 60% stenosis of proximal R cervical ICA and 90% stenosis of L cervical ICA Vascular referral  Dyslipidemia lipitor     Procedures:  Echo IMPRESSIONS     1. Left ventricular ejection fraction, by estimation, is 65 to 70%. Left  ventricular ejection fraction  by 2D MOD biplane is 68.4 %. The left  ventricle has normal function. The left  ventricle has no regional wall  motion abnormalities. Left ventricular  diastolic parameters were normal.   2. Right ventricular systolic function is normal. The right ventricular  size is normal. There is normal pulmonary artery systolic pressure. The  estimated right ventricular systolic pressure is 24.0 mmHg.   3. The mitral valve is normal in structure. No evidence of mitral valve  regurgitation.   4. The aortic valve is tricuspid. Aortic valve regurgitation is not  visualized. Aortic valve sclerosis/calcification is present, without any  evidence of aortic stenosis. Aortic valve mean gradient measures 4.0 mmHg.   5. The inferior vena cava is normal in size with <50% respiratory  variability, suggesting right atrial pressure of 8 mmHg.   Comparison(s): No prior Echocardiogram.   Consultations: neurology  Discharge Exam: Vitals:   04/07/24 1157 04/07/24 1554  BP: 131/66 (!) 117/53  Pulse: 64 64  Resp: 18   Temp: 97.9 F (36.6 C) 98.4 F (36.9 C)  SpO2: 100% 100%   Feeling better Eager to go home Doesn't remember how she took too much tegretol , it was an accident if it happened Discussed with her sister over the phone  General: No acute distress. Cardiovascular: RRR Lungs: unlabored Neurological: Alert and oriented 3. Moves all extremities 4 with equal strength. Cranial nerves II through XII intact. Skin: Warm and dry. No rashes or lesions. Extremities: No clubbing or cyanosis. No edema.   Discharge Instructions   Discharge Instructions     Ambulatory referral to Physical Therapy   Complete by: As directed    Ambulatory referral to Vascular Surgery   Complete by: As directed    Bilateral carotid stenosis, 90% on the left, 60% on the right.  Seem to be asymptomatic at this time.   Call MD for:  difficulty breathing, headache or visual disturbances   Complete by: As directed    Call MD for:  extreme fatigue   Complete by: As directed    Call MD for:  hives    Complete by: As directed    Call MD for:  persistant dizziness or light-headedness   Complete by: As directed    Call MD for:  persistant nausea and vomiting   Complete by: As directed    Call MD for:  redness, tenderness, or signs of infection (pain, swelling, redness, odor or green/yellow discharge around incision site)   Complete by: As directed    Call MD for:  severe uncontrolled pain   Complete by: As directed    Call MD for:  temperature >100.4   Complete by: As directed    Diet - low sodium heart healthy   Complete by: As directed    1.5 L fluid restriction   Discharge instructions   Complete by: As directed    You were seen for neurologic symptoms (confusion, issues with gait, and lethargy).  We think this was either related to carbamazepine  toxicity or Rmoni Keplinger transient ischemic attack.    You've been seen by neurology.  They recommend aspirin and plavix for 3 weeks followed by aspirin alone.  Continue your lipitor.  Follow with therapy as an outpatient.    Your tegretol  level was elevated and also could have contributed to your presenting symptoms.  It sounds like you may have taken some extra doses by mistake, but aren't sure.  Using Libbie Bartley pill box may help you with your medications.  Your tegretol   level has normalized.  Continue your tegretol  and follow up with your outpatient neurologist as scheduled.  You'll need to follow up with vascular surgery outpatient for your carotid artery stenosis.   You have chronically low sodium.  This is probably related to your tegretol  as well as possible dehydration.  Since you're feeling back to yourself and your sodium isn't too far off from your chronic levels (it's 124 this afternoon, your chronic sodium level appears to be around 127), we'll plan to discharge you, but this is not Laquincy Eastridge normal sodium level.  I'll send you home with salt tablets and Shreyan Hinz 1.5 L fluid restriction.  You should follow up with your PCP within 1 week (as early as possible) for  repeat labs to make sure your sodium level is stable and/or improving.  Call your PCP if you have any lightheadedness or dizziness.   Return for new, recurrent, or worsening symptoms.  Please ask your PCP to request records from this hospitalization so they know what was done and what the next steps will be.   Increase activity slowly   Complete by: As directed       Allergies as of 04/07/2024   No Known Allergies      Medication List     TAKE these medications    aspirin EC 81 MG tablet Take 1 tablet (81 mg total) by mouth daily. Swallow whole. Start taking on: April 08, 2024   atorvastatin  40 MG tablet Commonly known as: LIPITOR Take 1 tablet by mouth once daily What changed: when to take this   Calcium  Carb-Cholecalciferol 600-10 MG-MCG Tabs Take 1 tablet twice Franciscojavier Wronski day by oral route.   carbamazepine  100 MG 12 hr tablet Commonly known as: TEGRETOL  XR TAKE 5 TABLETS BY MOUTH TWICE DAILY   clopidogrel 75 MG tablet Commonly known as: PLAVIX Take 1 tablet (75 mg total) by mouth daily for 19 days. Start taking on: April 08, 2024   ONE-Kerianne Gurr-DAY 50 PLUS PO Take 1 tablet by mouth daily. gummies   PHENobarbital  64.8 MG tablet Commonly known as: LUMINAL TAKE 2 TABLETS BY MOUTH AT BEDTIME   sodium chloride  1 g tablet Take 1 tablet (1 g total) by mouth 2 (two) times daily with Anastasia Tompson meal. Continue Demarious Kapur 1.5 liter fluid restriction to the best of your ability.  Follow up with your PCP within 1 week for repeat labs.   zinc gluconate 50 MG tablet Take 50 mg by mouth daily.       No Known Allergies  Follow-up Information     [facility]. Schedule an appointment as soon as possible for Kinsie Belford visit.   Specialty: Rehabilitation Contact information: [address]  [place]  [address] [phone]        Cary No, NP. Go on 04/19/2024.   Specialty: Family Medicine Contact information: [address] [place] [address] [phone]                  The results of significant diagnostics from this hospitalization (including imaging, microbiology, ancillary and laboratory) are listed below for reference.    Significant Diagnostic Studies: ECHOCARDIOGRAM COMPLETE Result Date: 04/07/2024    ECHOCARDIOGRAM REPORT   Patient Name:   SUTTYN CRYDER Dement Date of Exam: 04/07/2024 Medical Rec #:  [id number]        Height:       62.2 in Accession #:    [id number]  Weight:       106.9 lb Date of Birth:  1956-04-08        BSA:          1.469 m Patient Age:    68 years         BP:           120/66 mmHg Patient Gender: F                HR:           63 bpm. Exam Location:  Inpatient Procedure: 2D Echo (Both Spectral and Color Flow Doppler were utilized during            procedure). Indications:   Stroke  History:       Patient has no prior history of Echocardiogram examinations.                Stroke.  Sonographer:   Norleen Amour Referring      [id number] ARY CUMMINS Phys: IMPRESSIONS  1. Left ventricular ejection fraction, by estimation, is 65 to 70%. Left ventricular ejection fraction by 2D MOD biplane is 68.4 %. The left ventricle has normal function. The left ventricle has no regional wall motion abnormalities. Left ventricular diastolic parameters were normal.  2. Right ventricular systolic function is normal. The right ventricular size is normal. There is normal pulmonary artery systolic pressure. The estimated right ventricular systolic pressure is 24.0 mmHg.  3. The mitral valve is normal in structure. No evidence of mitral valve regurgitation.  4. The aortic valve is tricuspid. Aortic valve regurgitation is not visualized. Aortic valve sclerosis/calcification is present, without any evidence of aortic stenosis. Aortic valve mean gradient measures 4.0 mmHg.  5. The inferior vena cava is normal in size with <50% respiratory variability, suggesting right atrial pressure of 8 mmHg. Comparison(s): No  prior Echocardiogram. FINDINGS  Left Ventricle: Left ventricular ejection fraction, by estimation, is 65 to 70%. Left ventricular ejection fraction by 2D MOD biplane is 68.4 %. The left ventricle has normal function. The left ventricle has no regional wall motion abnormalities. The left ventricular internal cavity size was normal in size. There is no left ventricular hypertrophy. Left ventricular diastolic parameters were normal. Right Ventricle: The right ventricular size is normal. No increase in right ventricular wall thickness. Right ventricular systolic function is normal. There is normal pulmonary artery systolic pressure. The tricuspid regurgitant velocity is 2.00 m/s, and  with an assumed right atrial pressure of 8 mmHg, the estimated right ventricular systolic pressure is 24.0 mmHg. Left Atrium: Left atrial size was normal in size. Right Atrium: Right atrial size was normal in size. Pericardium: There is no evidence of pericardial effusion. Mitral Valve: The mitral valve is normal in structure. No evidence of mitral valve regurgitation. Tricuspid Valve: The tricuspid valve is grossly normal. Tricuspid valve regurgitation is trivial. Aortic Valve: The aortic valve is tricuspid. Aortic valve regurgitation is not visualized. Aortic valve sclerosis/calcification is present, without any evidence of aortic stenosis. Aortic valve mean gradient measures 4.0 mmHg. Aortic valve peak gradient measures 7.8 mmHg. Aortic valve area, by VTI measures 1.41 cm. Pulmonic Valve: The pulmonic valve was normal in structure. Pulmonic valve regurgitation is not visualized. Aorta: The aortic root and ascending aorta are structurally normal, with no evidence of dilitation. Venous: The inferior vena cava is normal in size with less than 50% respiratory variability, suggesting right atrial pressure of 8 mmHg. IAS/Shunts: No atrial level shunt detected by color flow Doppler.  LEFT  VENTRICLE PLAX 2D                        Biplane EF  (MOD) LVIDd:         4.00 cm         LV Biplane EF:   Left LVIDs:         2.20 cm                          ventricular LV PW:         0.70 cm                          ejection LV IVS:        0.70 cm                          fraction by LVOT diam:     1.70 cm                          2D MOD LV SV:         43                               biplane is LV SV Index:   29                               68.4 %. LVOT Area:     2.27 cm                                Diastology                                LV e' medial:    9.14 cm/s LV Volumes (MOD)               LV E/e' medial:  8.1 LV vol d, MOD    48.7 ml       LV e' lateral:   11.00 cm/s A2C:                           LV E/e' lateral: 6.8 LV vol d, MOD    66.2 ml A4C: LV vol s, MOD    15.9 ml A2C: LV vol s, MOD    19.2 ml A4C: LV SV MOD A2C:   32.8 ml LV SV MOD A4C:   66.2 ml LV SV MOD BP:    40.3 ml RIGHT VENTRICLE             IVC RV Basal diam:  3.00 cm     IVC diam: 1.20 cm RV S prime:     11.90 cm/s TAPSE (M-mode): 2.1 cm LEFT ATRIUM             Index        RIGHT ATRIUM          Index LA diam:        2.50 cm 1.70 cm/m   RA Area:     7.09 cm LA Vol (A2C):   32.3 ml 21.99  ml/m  RA Volume:   12.40 ml 8.44 ml/m LA Vol (A4C):   17.0 ml 11.57 ml/m LA Biplane Vol: 25.7 ml 17.49 ml/m  AORTIC VALVE                    PULMONIC VALVE AV Area (Vmax):    1.35 cm     PV Vmax:       0.68 m/s AV Area (Vmean):   1.22 cm     PV Peak grad:  1.8 mmHg AV Area (VTI):     1.41 cm AV Vmax:           140.00 cm/s AV Vmean:          97.300 cm/s AV VTI:            0.304 m AV Peak Grad:      7.8 mmHg AV Mean Grad:      4.0 mmHg LVOT Vmax:         83.53 cm/s LVOT Vmean:        52.400 cm/s LVOT VTI:          0.188 m LVOT/AV VTI ratio: 0.62  AORTA Ao Root diam: 2.80 cm Ao Asc diam:  3.20 cm MITRAL VALVE               TRICUSPID VALVE MV Area (PHT): 3.81 cm    TR Peak grad:   16.0 mmHg MV Decel Time: 199 msec    TR Vmax:        200.00 cm/s MV E velocity: 74.40 cm/s MV Rhetta Cleek velocity: 61.30  cm/s  SHUNTS MV E/Miklos Bidinger ratio:  1.21        Systemic VTI:  0.19 m                            Systemic Diam: 1.70 cm Vinie Maxcy MD Electronically signed by Vinie Maxcy MD Signature Date/Time: 04/07/2024/1:24:58 PM    Final    EEG adult Result Date: 04/07/2024 Shelton Arlin KIDD, MD     04/07/2024 10:46 AM Patient Name: Temima Kutsch MRN: [id number] Epilepsy Attending: Arlin KIDD Shelton Referring Physician/Provider: Jerri Pfeiffer, MD Date: 04/07/2024 Duration: 22.38 mins Patient history: 68 y.o. 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 EEG to evaluate for seizure Level of alertness: Awake AEDs during EEG study: Phenobarb Technical aspects: This EEG study was done with scalp electrodes positioned according to the 10-20 International system of electrode placement. Electrical activity was reviewed with band pass filter of 1-70Hz , sensitivity of 7 uV/mm, display speed of 42mm/sec with Nyjah Schwake 60Hz  notched filter applied as appropriate. EEG data were recorded continuously and digitally stored.  Video monitoring was available and reviewed as appropriate. Description: The posterior dominant rhythm consists of 8 Hz activity of moderate voltage (25-35 uV) seen predominantly in posterior head regions, symmetric and reactive to eye opening and eye closing. EEG showed intermittent generalized 3 to 6 Hz theta-delta slowing. Hyperventilation and photic stimulation were not performed.   ABNORMALITY - Intermittent slow, generalized IMPRESSION: This study is suggestive of mild diffuse encephalopathy. No seizures or epileptiform discharges were seen throughout the recording. Arlin KIDD Shelton   MR BRAIN WO CONTRAST Result Date: 04/06/2024 EXAM: MRI BRAIN WITHOUT CONTRAST 04/06/2024 09:23:23 PM TECHNIQUE: Multiplanar multisequence MRI of the head/brain was performed without the administration of intravenous contrast. COMPARISON: 11/28/2023 CLINICAL  HISTORY: Neuro  deficit, acute, stroke suspected. Patient developed mild dizziness and nausea yesterday evening. The symptoms were much worse early this morning when she woke. She has had difficulty ambulating today due to severe room spinning sensation. She has also been experiencing severe nausea and Manraj Yeo headache. She denies any focal numbness or weakness, abdominal pain, fever, or chills. FINDINGS: BRAIN AND VENTRICLES: Multifocal hyperintense T2-weighted signal within the cerebral white matter, most commonly due to chronic small vessel disease. No acute infarct. No intracranial hemorrhage. No mass. No midline shift. No hydrocephalus. The sella is unremarkable. Normal flow voids. ORBITS: No acute abnormality. SINUSES AND MASTOIDS: No acute abnormality. BONES AND SOFT TISSUES: Normal marrow signal. No acute soft tissue abnormality. IMPRESSION: 1. No acute intracranial abnormality. 2. Multifocal hyperintense T2-weighted signal within the cerebral white matter, most commonly due to chronic small vessel disease. Electronically signed by: Franky Stanford MD 04/06/2024 11:57 PM EDT RP Workstation: [id number]   CT ANGIO HEAD NECK W WO CM Addendum Date: 04/06/2024 ** ADDENDUM #1 ** ADDENDUM: Findings discussed with Dr. Mannie at 4:29PM on 04/06/24. ---------------------------------------------------- Electronically signed by: Donnice Mania MD 04/06/2024 04:39 PM EDT RP Workstation: [id number]   Result Date: 04/06/2024 ** ORIGINAL REPORT ** EXAM: CTA HEAD AND NECK WITHOUT AND WITH 04/06/2024 03:39:37 PM TECHNIQUE: CTA of the head and neck was performed without and with the administration of 75 mL of intravenous iohexol  (OMNIPAQUE ) 350 MG/ML injection. Multiplanar 2D and/or 3D reformatted images are provided for review. Automated exposure control, iterative reconstruction, and/or weight based adjustment of the mA/kV was utilized to reduce the radiation dose to as low as reasonably achievable. Stenosis of the internal carotid  arteries measured using NASCET criteria. COMPARISON: CT head and MRI head dated 11/28/2023. CLINICAL HISTORY: Stroke/TIA, determine embolic source. Triage note: Pt via pov from home with dizziness that began last evening around 1730-1800. Pt is confused Jathan Balling bit, but her sister is with her and filled in blanks. Pt went to pcp, who sent her here. Pt Veora Fonte\T\o x 4; denies pain. No neuro deficits in triage. NAD Noted. FINDINGS: CTA NECK: AORTIC ARCH AND ARCH VESSELS: Mild atherosclerosis of the aortic arch and proximal right subclavian artery without significant stenosis. No dissection or arterial injury. CERVICAL CAROTID ARTERIES: The right carotid artery is patent from the origin to the skull base. Calcified atherosclerosis at the right carotid bifurcation with approximately 60% stenosis of the proximal right cervical internal carotid artery (ICA). The left carotid artery is patent from the origin to the skull base. Bulky calcified atherosclerosis at the left carotid bifurcation with approximately 90% stenosis of the proximal left cervical internal carotid artery (ICA). CERVICAL VERTEBRAL ARTERIES: The left vertebral artery is dominant and is patent from the origin to the vertebrobasilar confluence. Mild tortuosity of the left V1 segment. There is mild irregularity of the nondominant right vertebral artery. The right vertebral artery is patent from the origin to the distal V3 segment. There is tapering and irregularity of the distal V3 segment with occlusion of the vessel as it transitions through the dura. There is extradural origin of the left posterior inferior cerebellar artery (PICA). The right PICA appears supplied via collaterals from the left PICA. There is contrast filling the distal right V4 segment likely via collaterals or retrograde flow. Findings are concerning for right vertebral artery dissection. LUNGS AND MEDIASTINUM: Unremarkable. SOFT TISSUES: No acute abnormality. BONES: Degenerative changes in the  visualized spine. CTA HEAD: ANTERIOR CIRCULATION: The intracranial internal carotid arteries are patent bilaterally. Atherosclerosis of the bilateral carotid  siphons resulting in mild stenosis. The middle cerebral arteries are patent bilaterally. The anterior cerebral arteries are patent bilaterally. No aneurysm. POSTERIOR CIRCULATION: No significant stenosis of the posterior cerebral arteries. No significant stenosis of the basilar artery. The intracranial segments of the vertebral arteries are patent. No aneurysm. OTHER: No dural venous sinus thrombosis on this non-dedicated study. IMPRESSION: 1. 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 2. Reconstitution of the distal right V4 segment likely via retrograde flow. 3. Approximately 60% stenosis of the proximal right cervical ICA and approximately 90% stenosis of the proximal left cervical ICA. Electronically signed by: Donnice Mania MD 04/06/2024 04:27 PM EDT RP Workstation: [id number]   CT Head Wo Contrast Result Date: 04/06/2024 CLINICAL DATA:  Dizziness and confusion since last night. No injury. EXAM: CT HEAD WITHOUT CONTRAST TECHNIQUE: Contiguous axial images were obtained from the base of the skull through the vertex without intravenous contrast. RADIATION DOSE REDUCTION: This exam was performed according to the departmental dose-optimization program which includes automated exposure control, adjustment of the mA and/or kV according to patient size and/or use of iterative reconstruction technique. COMPARISON:  09/06/2013 FINDINGS: Brain: Ventricles, cisterns and other CSF spaces is are normal. There is minimal chronic ischemic microvascular disease. No mass, mass effect, shift of midline structures or acute hemorrhage. No evidence of acute infarction. Vascular: No hyperdense vessel or unexpected calcification. Skull: Normal. Negative for fracture or focal lesion. Sinuses/Orbits: Orbits are normal.  Paranasal sinuses  are clear. Other: None. IMPRESSION: 1. No acute findings. 2. Minimal chronic ischemic microvascular disease. Electronically Signed   By: Toribio Agreste M.D.   On: 04/06/2024 14:08    Microbiology: No results found for this or any previous visit (from the past 240 hours).   Labs: Basic Metabolic Panel: Recent Labs  Lab 04/06/24 1934 04/07/24 0350 04/07/24 0803 04/07/24 1115 04/07/24 1530  NA 120* 120* 120* 123* 124*  K 3.8 3.8 3.5 3.4* 3.5  CL 86* 90* 89* 92* 92*  CO2 23 22 20* 22 22  GLUCOSE 106* 79 93 112* 81  BUN 6* 7* 9 10 9   CREATININE 0.42* 0.41* 0.50 0.60 0.56  CALCIUM  8.4* 8.3* 8.0* 7.9* 7.9*   Liver Function Tests: Recent Labs  Lab 04/06/24 1254  AST 38  ALT 25  ALKPHOS 126  BILITOT 0.4  PROT 6.8  ALBUMIN 4.0   No results for input(s): LIPASE, AMYLASE in the last 168 hours. Recent Labs  Lab 04/06/24 2146  AMMONIA 20   CBC: Recent Labs  Lab 04/06/24 1254 04/07/24 0350  WBC 3.5* 2.7*  NEUTROABS 2.1  --   HGB 10.8* 10.5*  HCT 30.1* 29.4*  MCV 94.1 93.3  PLT 228 230   Cardiac Enzymes: No results for input(s): CKTOTAL, CKMB, CKMBINDEX, TROPONINI in the last 168 hours. BNP: BNP (last 3 results) No results for input(s): BNP in the last 8760 hours.  ProBNP (last 3 results) No results for input(s): PROBNP in the last 8760 hours.  CBG: No results for input(s): GLUCAP in the last 168 hours.     Signed:  Meliton Monte MD.  [facility] 04/07/2024, 4:45 PM

## 2024-04-07 NOTE — Procedures (Signed)
 Patient Name: Hopie Pellegrin  MRN: [id number]  Epilepsy Attending: Arlin MALVA Krebs  Referring Physician/Provider: Jerri Pfeiffer, MD  Date: 04/07/2024 Duration: 22.38 mins  Patient history: 68 y.o. 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 EEG to evaluate for seizure  Level of alertness: Awake  AEDs during EEG study: Phenobarb  Technical aspects: This EEG study was done with scalp electrodes positioned according to the 10-20 International system of electrode placement. Electrical activity was reviewed with band pass filter of 1-70Hz , sensitivity of 7 uV/mm, display speed of 48mm/sec with a 60Hz  notched filter applied as appropriate. EEG data were recorded continuously and digitally stored.  Video monitoring was available and reviewed as appropriate.  Description: The posterior dominant rhythm consists of 8 Hz activity of moderate voltage (25-35 uV) seen predominantly in posterior head regions, symmetric and reactive to eye opening and eye closing. EEG showed intermittent generalized 3 to 6 Hz theta-delta slowing. Hyperventilation and photic stimulation were not performed.     ABNORMALITY - Intermittent slow, generalized  IMPRESSION: This study is suggestive of mild diffuse encephalopathy. No seizures or epileptiform discharges were seen throughout the recording.  Rasha Ibe O Calyn Rubi

## 2024-04-07 NOTE — Progress Notes (Addendum)
 STROKE TEAM PROGRESS NOTE    SIGNIFICANT HOSPITAL EVENTS -N/A  INTERIM HISTORY/SUBJECTIVE Patient seen at bedside. Reports she walked with PT this morning. States she feels great. Reports on Tuesday, she believes she took an additional dose of her home Tegretol  unintentionally. She took normal dose in the morning, then accidentally took another dose later as she had forgotten she had already taken it earlier in the day.    OBJECTIVE  CBC    Component Value Date/Time   WBC 2.7 (L) 04/07/2024 0350   RBC 3.15 (L) 04/07/2024 0350   HGB 10.5 (L) 04/07/2024 0350   HGB 11.5 08/08/2020 0859   HCT 29.4 (L) 04/07/2024 0350   HCT 32.7 (L) 08/08/2020 0859   PLT 230 04/07/2024 0350   PLT 305 08/08/2020 0859   MCV 93.3 04/07/2024 0350   MCV 92 08/08/2020 0859   MCH 33.3 04/07/2024 0350   MCHC 35.7 04/07/2024 0350   RDW 12.5 04/07/2024 0350   RDW 12.0 08/08/2020 0859   LYMPHSABS 1.0 04/06/2024 1254   LYMPHSABS 1.4 08/08/2020 0859   MONOABS 0.4 04/06/2024 1254   EOSABS 0.0 04/06/2024 1254   EOSABS 0.1 08/08/2020 0859   BASOSABS 0.0 04/06/2024 1254   BASOSABS 0.1 08/08/2020 0859    BMET    Component Value Date/Time   NA 120 (L) 04/07/2024 0803   NA 129 (L) 04/20/2023 1516   K 3.5 04/07/2024 0803   CL 89 (L) 04/07/2024 0803   CO2 20 (L) 04/07/2024 0803   GLUCOSE 93 04/07/2024 0803   BUN 9 04/07/2024 0803   BUN 10 04/20/2023 1516   CREATININE 0.50 04/07/2024 0803   CREATININE 0.42 (L) 11/27/2023 1612   CALCIUM  8.0 (L) 04/07/2024 0803   EGFR 105 11/27/2023 1612   EGFR 102 04/20/2023 1516   GFRNONAA >60 04/07/2024 0803    IMAGING past 24 hours MR BRAIN WO CONTRAST Result Date: 04/06/2024 EXAM: MRI BRAIN WITHOUT CONTRAST 04/06/2024 09:23:23 PM TECHNIQUE: Multiplanar multisequence MRI of the head/brain was performed without the administration of intravenous contrast. COMPARISON: 11/28/2023 CLINICAL HISTORY: Neuro deficit, acute, stroke suspected. Patient developed mild dizziness  and nausea yesterday evening. The symptoms were much worse early this morning when she woke. She has had difficulty ambulating today due to severe room spinning sensation. She has also been experiencing severe nausea and a headache. She denies any focal numbness or weakness, abdominal pain, fever, or chills. FINDINGS: BRAIN AND VENTRICLES: Multifocal hyperintense T2-weighted signal within the cerebral white matter, most commonly due to chronic small vessel disease. No acute infarct. No intracranial hemorrhage. No mass. No midline shift. No hydrocephalus. The sella is unremarkable. Normal flow voids. ORBITS: No acute abnormality. SINUSES AND MASTOIDS: No acute abnormality. BONES AND SOFT TISSUES: Normal marrow signal. No acute soft tissue abnormality. IMPRESSION: 1. No acute intracranial abnormality. 2. Multifocal hyperintense T2-weighted signal within the cerebral white matter, most commonly due to chronic small vessel disease. Electronically signed by: Franky Stanford MD 04/06/2024 11:57 PM EDT RP Workstation: [id number]   CT ANGIO HEAD NECK W WO CM Addendum Date: 04/06/2024 ADDENDUM #1 ADDENDUM: Findings discussed with Dr. Mannie at 4:29PM on 04/06/24. ---------------------------------------------------- Electronically signed by: Donnice Mania MD 04/06/2024 04:39 PM EDT RP Workstation: [id number]   Result Date: 04/06/2024 ORIGINAL REPORT EXAM: CTA HEAD AND NECK WITHOUT AND WITH 04/06/2024 03:39:37 PM TECHNIQUE: CTA of the head and neck was performed without and with the administration of 75 mL of intravenous iohexol  (OMNIPAQUE ) 350 MG/ML injection. Multiplanar 2D and/or 3D reformatted images are  provided for review. Automated exposure control, iterative reconstruction, and/or weight based adjustment of the mA/kV was utilized to reduce the radiation dose to as low as reasonably achievable. Stenosis of the internal carotid arteries measured using NASCET criteria. COMPARISON: CT head and MRI head dated 11/28/2023.  CLINICAL HISTORY: Stroke/TIA, determine embolic source. Triage note: Pt via pov from home with dizziness that began last evening around 1730-1800. Pt is confused a bit, but her sister is with her and filled in blanks. Pt went to pcp, who sent her here. Pt a\T\o x 4; denies pain. No neuro deficits in triage. NAD Noted. FINDINGS: CTA NECK: AORTIC ARCH AND ARCH VESSELS: Mild atherosclerosis of the aortic arch and proximal right subclavian artery without significant stenosis. No dissection or arterial injury. CERVICAL CAROTID ARTERIES: The right carotid artery is patent from the origin to the skull base. Calcified atherosclerosis at the right carotid bifurcation with approximately 60% stenosis of the proximal right cervical internal carotid artery (ICA). The left carotid artery is patent from the origin to the skull base. Bulky calcified atherosclerosis at the left carotid bifurcation with approximately 90% stenosis of the proximal left cervical internal carotid artery (ICA). CERVICAL VERTEBRAL ARTERIES: The left vertebral artery is dominant and is patent from the origin to the vertebrobasilar confluence. Mild tortuosity of the left V1 segment. There is mild irregularity of the nondominant right vertebral artery. The right vertebral artery is patent from the origin to the distal V3 segment. There is tapering and irregularity of the distal V3 segment with occlusion of the vessel as it transitions through the dura. There is extradural origin of the left posterior inferior cerebellar artery (PICA). The right PICA appears supplied via collaterals from the left PICA. There is contrast filling the distal right V4 segment likely via collaterals or retrograde flow. Findings are concerning for right vertebral artery dissection. LUNGS AND MEDIASTINUM: Unremarkable. SOFT TISSUES: No acute abnormality. BONES: Degenerative changes in the visualized spine. CTA HEAD: ANTERIOR CIRCULATION: The intracranial internal carotid arteries are  patent bilaterally. Atherosclerosis of the bilateral carotid siphons resulting in mild stenosis. The middle cerebral arteries are patent bilaterally. The anterior cerebral arteries are patent bilaterally. No aneurysm. POSTERIOR CIRCULATION: No significant stenosis of the posterior cerebral arteries. No significant stenosis of the basilar artery. The intracranial segments of the vertebral arteries are patent. No aneurysm. OTHER: No dural venous sinus thrombosis on this non-dedicated study. IMPRESSION: 1. 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 2. Reconstitution of the distal right V4 segment likely via retrograde flow. 3. Approximately 60% stenosis of the proximal right cervical ICA and approximately 90% stenosis of the proximal left cervical ICA. Electronically signed by: Donnice Mania MD 04/06/2024 04:27 PM EDT RP Workstation: [id number]   CT Head Wo Contrast Result Date: 04/06/2024 CLINICAL DATA:  Dizziness and confusion since last night. No injury. EXAM: CT HEAD WITHOUT CONTRAST TECHNIQUE: Contiguous axial images were obtained from the base of the skull through the vertex without intravenous contrast. RADIATION DOSE REDUCTION: This exam was performed according to the departmental dose-optimization program which includes automated exposure control, adjustment of the mA and/or kV according to patient size and/or use of iterative reconstruction technique. COMPARISON:  09/06/2013 FINDINGS: Brain: Ventricles, cisterns and other CSF spaces is are normal. There is minimal chronic ischemic microvascular disease. No mass, mass effect, shift of midline structures or acute hemorrhage. No evidence of acute infarction. Vascular: No hyperdense vessel or unexpected calcification. Skull: Normal. Negative for fracture or focal lesion.  Sinuses/Orbits: Orbits are normal.  Paranasal sinuses are clear. Other: None. IMPRESSION: 1. No acute findings. 2. Minimal chronic ischemic  microvascular disease. Electronically Signed   By: Toribio Agreste M.D.   On: 04/06/2024 14:08    Vitals:   04/07/24 0031 04/07/24 0357 04/07/24 0751 04/07/24 0751  BP: 105/65 105/67 120/66 120/66  Pulse: 60 67 68 70  Resp: 16 15 18 18   Temp: 98.2 F (36.8 C) (!) 97.5 F (36.4 C) 98.4 F (36.9 C) 98.4 F (36.9 C)  TempSrc: Oral Oral    SpO2: 98% 97% 99% 99%  Weight:      Height:         PHYSICAL EXAM General:  Alert, well-nourished, well-developed patient in no acute distress Psych:  Mood and affect appropriate for situation Respiratory: Regular, unlabored respirations on room air   NEURO:  Mental Status: AA&Ox3, patient is able to give clear and coherent history, although forgot that she had EEG done this morning Speech/Language: speech is without dysarthria or aphasia. Naming, repetition, fluency, and comprehension intact.  Cranial Nerves:  II: PERRL. Visual fields full.  III, IV, VI: EOMI. Eyelids elevate symmetrically.  V: Sensation is intact to light touch and symmetrical to face.  VII: Face is symmetrical resting and smiling VIII: hearing intact to voice. IX, X: Palate elevates symmetrically. Phonation is normal.  XI: Shoulder shrug 5/5. XII: Tongue is midline without fasciculations. Motor: 5/5 strength to all muscle groups tested.  Tone: is normal and bulk is normal Sensation- Intact to light touch bilaterally. Extinction absent to light touch to DSS.   Coordination: FTN intact bilaterally, HKS: no ataxia in BLE. No drift.  Gait- deferred  Most Recent NIH: 0   ASSESSMENT:  Ms. Elysha Daw is a 68 y.o. female with history of HLD, epilepsy, chronic hyponatremia presenting with dizziness, confusion, ataxia, N/V, and nystagmus, and admitted for stroke evaluation. NIH on Admission 1.  Patient reporting possible overdose on carbamazepine  two days ago, and carbamazepine  level was elevated on admission. Presenting symptoms of ataxia, N/V, and nystagmus do appear  consistent with carbamazepine  toxicity, however cannot rule out TIA, therefore will continue DAPT and statin therapy. 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 Doubt whether there is truly a dissection, or more likely a hypoplastic vessel. CTA also showed approximately 60% stenosis of the proximal R ICA and approximately 90% stenosis of the L ICA. Recommend patient follow-up outpatient with vascular surgery for further evaluation and management.   With her seizure history and concurrent hyponatremia, ordered a repeat EEG this morning and repeat carbamazepine  level. EEG without seizure activity. If carbamazepine  returns to normal range, can ask pharmacy to restart it before discharge. Echo is pending.  PLAN:  Confusion, gait ataxia, lethargy likely 2/2 carbamazepine  toxicity vs TIA - Code Stroke CT head with no acute abnormality and evidence of small vessel disease. 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 instead of dissection/occlusion - MRI brain with no acute abnormality - 2D Echo EF 65-70% - LDL 108 - HgbA1c 4.9 - UDS benzo and barbiturates (home and hospital meds) - VTE prophylaxis - Lovenox - No antithrombotic prior to admission, now on aspirin 81 mg daily and clopidogrel 75 mg daily for 3 weeks and  then ASA alone - Therapy recommendations:  outpt PT - Disposition: pending  Hx of seizures Tegretol  toxicity - pt might take extra dose of tegretol  but can not for sure - tegretol  level 13.8, now down to 5.7 - will resume tegretol  high dose at 500 mg bid - Continue home phenobarbital  - continue follow up with Lomax / Dr. Gregg at [facility] - EEG: no evidence of seizure activity  Carotid stenosis CT head and neck showed right ICA 60% stenosis, left ICA  90% stenosis Seems to be asymptomatic at this time Will need close vascular surgery follow-up  Hyponatremia, chronic - Na 120 - EEG no seizure - likely the side effect from tegretol .  - will follow up with neuro Lomax/Dr. Gregg as outpt  Hyperlipidemia - Home meds:  Lipitor 40 mg daily resumed in hospital - LDL 108, goal < 70 - Continue statin at discharge  Other stroke risk factors Advanced age  Other medical issues Leukopenia WBC 3.5--2.7  Hospital day # 0  Neurology will sign off. Please call with questions. Pt will follow up with Lomax NP at [facility] on 04/19/2024. Thanks for the consult.  Ary Cummins, MD PhD Stroke Neurology 04/07/2024 4:11 PM   To contact Stroke Continuity provider, please refer to [email or web address]. After hours, contact General Neurology

## 2024-04-07 NOTE — Progress Notes (Signed)
 SLP Cancellation Note  Patient Details Name: Sonal Dorwart MRN: [id number] DOB: May 10, 1956   Cancelled treatment:       Reason Eval/Treat Not Completed: SLP screened, no needs identified, will sign off   Dayane Hillenburg, Consuelo Fitch 04/07/2024, 1:20 PM

## 2024-04-07 NOTE — Care Management Obs Status (Signed)
 MEDICARE OBSERVATION STATUS NOTIFICATION   Patient Details  Name: Brenda Woods MRN: [id number] Date of Birth: 04-09-1956   Medicare Observation Status Notification Given:  Yes    Andrez JULIANNA George, RN 04/07/2024, 3:33 PM

## 2024-04-07 NOTE — Progress Notes (Signed)
 EEG complete, results are pending.

## 2024-04-11 ENCOUNTER — Ambulatory Visit: Admitting: Family Medicine

## 2024-04-11 ENCOUNTER — Other Ambulatory Visit: Payer: Self-pay

## 2024-04-11 ENCOUNTER — Encounter: Payer: Self-pay | Admitting: Family Medicine

## 2024-04-11 ENCOUNTER — Other Ambulatory Visit: Payer: Self-pay | Admitting: Family Medicine

## 2024-04-11 VITALS — BP 130/60 | HR 66 | Temp 98.0°F | Ht 62.25 in | Wt 107.5 lb

## 2024-04-11 DIAGNOSIS — T421X1A Poisoning by iminostilbenes, accidental (unintentional), initial encounter: Secondary | ICD-10-CM | POA: Insufficient documentation

## 2024-04-11 DIAGNOSIS — I7774 Dissection of vertebral artery: Secondary | ICD-10-CM | POA: Diagnosis not present

## 2024-04-11 DIAGNOSIS — T421X1D Poisoning by iminostilbenes, accidental (unintentional), subsequent encounter: Secondary | ICD-10-CM

## 2024-04-11 DIAGNOSIS — E871 Hypo-osmolality and hyponatremia: Secondary | ICD-10-CM

## 2024-04-11 LAB — CORTISOL: Cortisol, Plasma: 9.2 ug/dL

## 2024-04-11 LAB — CBC WITH DIFFERENTIAL/PLATELET
Basophils Absolute: 0 K/uL (ref 0.0–0.1)
Basophils Relative: 0.4 % (ref 0.0–3.0)
Eosinophils Absolute: 0 K/uL (ref 0.0–0.7)
Eosinophils Relative: 0 % (ref 0.0–5.0)
HCT: 33.9 % — ABNORMAL LOW (ref 36.0–46.0)
Hemoglobin: 11.7 g/dL — ABNORMAL LOW (ref 12.0–15.0)
Lymphocytes Relative: 32.7 % (ref 12.0–46.0)
Lymphs Abs: 1.3 K/uL (ref 0.7–4.0)
MCHC: 34.6 g/dL (ref 30.0–36.0)
MCV: 98.4 fl (ref 78.0–100.0)
Monocytes Absolute: 0.3 K/uL (ref 0.1–1.0)
Monocytes Relative: 6.7 % (ref 3.0–12.0)
Neutro Abs: 2.4 K/uL (ref 1.4–7.7)
Neutrophils Relative %: 60.2 % (ref 43.0–77.0)
Platelets: 254 K/uL (ref 150.0–400.0)
RBC: 3.45 Mil/uL — ABNORMAL LOW (ref 3.87–5.11)
RDW: 13.4 % (ref 11.5–15.5)
WBC: 3.9 K/uL — ABNORMAL LOW (ref 4.0–10.5)

## 2024-04-11 MED ORDER — ATORVASTATIN CALCIUM 40 MG PO TABS: 40.0000 mg | ORAL_TABLET | Freq: Every day | ORAL | 0 refills | Status: DC

## 2024-04-11 NOTE — Progress Notes (Signed)
   Subjective:    Patient ID: Brenda Woods, female    DOB: May 20, 1956, 68 y.o.   MRN: [id number]  HPI Hospital f/u- pt was admitted 10/1-10/2 for dizziness, nausea, and headache.  MRI was negative for CVA.  CTA showed R vertebral artery dissection w/ 60% occlusion of R proximal ICA and 90% stenosis of L proximal ICA.  She was hyponatremic w/ Na 130 and Carbamazepine  level was elevated at 13.8.  Her tegretol  level decreased to 5.7 on day of d/c.  She was to resume her home dose of medication.  She was started on both ASA and Plavix by Neurology who want her to f/u as outpt.  She was also started on salt tabs and fluid restriction to correct her hyponatremia (up to 124 at time of d/c.  Today pt reports some dizziness (not spinning), fatigue, some occipital pain described as a 'dull ache'.  This has been ongoing x3 days.  Sister is here today and reports she was fine after hospital d/c, fine on Saturday.  Pt reports she is eating but sister states not enough.  Pt reports she is drinking 1 Ensure daily.  Pt reports this is how she felt when things started last week.  Has appt upcoming w/ Neuro and Vascular.  PT called this morning and could not leave a message.     Review of Systems For ROS see HPI     Objective:   Physical Exam Constitutional:      General: She is not in acute distress.    Appearance: She is well-developed. She is ill-appearing (frail).  HENT:     Head: Normocephalic and atraumatic.     Right Ear: Tympanic membrane and ear canal normal.     Left Ear: Tympanic membrane and ear canal normal.  Eyes:     Extraocular Movements: Extraocular movements intact.     Conjunctiva/sclera: Conjunctivae normal.     Pupils: Pupils are equal, round, and reactive to light.  Neck:     Thyroid : No thyromegaly.  Cardiovascular:     Rate and Rhythm: Normal rate and regular rhythm.     Heart sounds: Normal heart sounds. No murmur heard. Pulmonary:     Effort: Pulmonary effort is normal.  No respiratory distress.     Breath sounds: Normal breath sounds.  Abdominal:     General: There is no distension.     Palpations: Abdomen is soft.     Tenderness: There is no abdominal tenderness.  Musculoskeletal:     Cervical back: Normal range of motion and neck supple.  Lymphadenopathy:     Cervical: No cervical adenopathy.  Skin:    General: Skin is warm and dry.  Neurological:     Mental Status: She is alert and oriented to person, place, and time. Mental status is at baseline.  Psychiatric:        Mood and Affect: Mood normal.        Behavior: Behavior normal.        Thought Content: Thought content normal.           Assessment & Plan:

## 2024-04-11 NOTE — Telephone Encounter (Signed)
 Copied from CRM #[id number]. Topic: Clinical - Medication Refill >> Apr 11, 2024  2:05 PM Sasha M wrote: Medication: atorvastatin  (LIPITOR) 40 MG tablet  Has the patient contacted their pharmacy? Yes (Agent: If no, request that the patient contact the pharmacy for the refill. If patient does not wish to contact the pharmacy document the reason why and proceed with request.) (Agent: If yes, when and what did the pharmacy advise?) Told to contact provider, pharmacy sent request already  This is the patient's preferred pharmacy:  [facility] [address] [place] - [address] [place] [address] Phone: [phone] Fax: [phone]   Is this the correct pharmacy for this prescription? Yes If no, delete pharmacy and type the correct one.   Has the prescription been filled recently? No  Is the patient out of the medication? No  Has the patient been seen for an appointment in the last year OR does the patient have an upcoming appointment? Yes  Can we respond through MyChart? No  Agent: Please be advised that Rx refills may take up to 3 business days. We ask that you follow-up with your pharmacy.

## 2024-04-11 NOTE — Patient Instructions (Addendum)
 We'll decide when you need to follow up based on your lab results Continue to take your salt tablets and follow your water restrictions Continue the Plavix and Aspirin daily Take the Carbamazepine  (Tegretol ) as directed Call [facility] to set up an appt [phone] Call with any questions or concerns- particularly if you have worsening dizziness or any other symptoms Hang in there!!

## 2024-04-12 ENCOUNTER — Ambulatory Visit: Payer: Self-pay | Admitting: Family Medicine

## 2024-04-12 LAB — HEPATIC FUNCTION PANEL
ALT: 25 U/L (ref 0–35)
AST: 38 U/L — ABNORMAL HIGH (ref 0–37)
Albumin: 4.5 g/dL (ref 3.5–5.2)
Alkaline Phosphatase: 114 U/L (ref 39–117)
Bilirubin, Direct: 0.1 mg/dL (ref 0.0–0.3)
Total Bilirubin: 0.4 mg/dL (ref 0.2–1.2)
Total Protein: 7.3 g/dL (ref 6.0–8.3)

## 2024-04-12 LAB — BASIC METABOLIC PANEL WITH GFR
BUN: 8 mg/dL (ref 6–23)
CO2: 28 meq/L (ref 19–32)
Calcium: 9.2 mg/dL (ref 8.4–10.5)
Chloride: 88 meq/L — ABNORMAL LOW (ref 96–112)
Creatinine, Ser: 0.33 mg/dL — ABNORMAL LOW (ref 0.40–1.20)
GFR: 104.94 mL/min (ref >60.00)
Glucose, Bld: 90 mg/dL (ref 70–99)
Potassium: 4.9 meq/L (ref 3.5–5.1)
Sodium: 126 meq/L — ABNORMAL LOW (ref 135–145)

## 2024-04-12 LAB — CARBAMAZEPINE LEVEL, TOTAL: Carbamazepine Lvl: 10.5 mg/L (ref 4.0–12.0)

## 2024-04-12 NOTE — Progress Notes (Signed)
 Pt has been notified.

## 2024-04-15 ENCOUNTER — Other Ambulatory Visit: Payer: Self-pay | Admitting: *Deleted

## 2024-04-15 DIAGNOSIS — R0989 Other specified symptoms and signs involving the circulatory and respiratory systems: Secondary | ICD-10-CM

## 2024-04-18 NOTE — Progress Notes (Unsigned)
 PATIENT: Brenda Woods DOB: 1956-04-05  REASON FOR VISIT: follow up HISTORY FROM: patient  No chief complaint on file.     HISTORY OF PRESENT ILLNESS:  04/19/2024 ALL:  Brenda Woods returns for follow up for seizures. She was last seen by Dr Gregg 04/2023 and doing well on carbamazepine  500mg  BID and phenobarbital  129.6mg  at bedtime. Labs were normal with exception of low sodium. Salt tablet prescribed.   Since,   She was hospitalized 10/1 following concerns of dizziness, nausea and headache. She had reported possibility of taking a few extra doses unintentionally. She was worked up for stroke. MRI negative. 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 Vascular referral placed. She was started on DAPT for three weeks then asa alone. CBZ 13.8. Na 123. Fluid restrictions and sodium tablets given. She had follow up with her PCP 04/11/2024. CBZ down to 10.5. Na 126.   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 She has continued carbamazepine  500mg  BID and phenobarbital  129.6mg  at bedtime since last seizure in 2015. Prior to 2015, she had been on phenobarbital  and carbamazepine  400mg  BID for as far back as she can remember (probably since 1990). Last seen 08/2020 and doing well on current regimen. She was seen by PCP 03/04/2021. Labs revealed sodium level of 126. Baseline 128-131 over past 5 years. CBC 3.71, HGB 12. Liver enzymes and GGT normal.    She has tolerated regimen for many years and very hesitant to switch AED. She denies concerns of mentation changes, weakness or unusual fatigue. She lost her husband and now lives alone. She is driving without difficulty. She is able to manage medications and perform ADLs independently. She does not remember ever taking levetiracetam  in the past.   08/08/2020 ALL: She returns for seizure follow up. She continues carbamazepine  500mg  BID and phenobarbital  129.6mg  at bedtime. She is doing  very well. No seizure activity. She continues to work. She does not have PCP.   08/08/2019 ALL:  Brenda Woods is a 68 y.o. female here today for follow up for seizure. She continues carbamazepine  500mg  twice daily and phenobarbital  129.6mg  at bedtime (two 64.8mg  tablets). She denies recent seizure activity or missed doses of medication. She denies obvious adverse effects. She is staying well hydrated. She does not have an established PCP. She reports a history of white coat syndrome. She does not check BP at home. She denies chest pain, shortness or breath, dizziness or headaches.   HISTORY: (copied from [employer] note on 08/02/2018)  Brenda Woods, 68 year old female returns for yearly followup,  with a history of seizure disorder with last seizure occurring in May 2015 and then previously in 1990.  Her last seizure occurred  in the grocery store and was reaching for something when she started having jerking of her extremities. 911 was called. She has no recollection of the events. According to the hospital record she was dehydrated. Her Tegretol  dose was increased to 500 mg twice daily. She is also on phenobarbital .  On return visit today she has not had recurrent seizure activity, she needs refills on her medications and labs. No new  neurologic complaints.  No interval medical issues.  She continues to work full-time.  She continues to drive a car without difficulty    HISTORY: She had a history of seizure disorder that is well controlled on Tegretol  and Phenobarbital . Her last seizure occurred in 1990. Denies side effects to her  medication. She had been followed at [facility] since 1968 with onset of seizures at age 35. She has had multiple trials of medications. She has no other significant medical problems.    REVIEW OF SYSTEMS: Out of a complete 14 system review of symptoms, the patient complains only of the following symptoms, none and all other reviewed systems are negative.  ALLERGIES: No Known  Allergies  HOME MEDICATIONS: Outpatient Medications Prior to Visit  Medication Sig Dispense Refill   aspirin EC 81 MG tablet Take 1 tablet (81 mg total) by mouth daily. Swallow whole. 90 tablet 0   atorvastatin  (LIPITOR) 40 MG tablet Take 1 tablet (40 mg total) by mouth daily. 30 tablet 0   Calcium  Carb-Cholecalciferol 600-10 MG-MCG TABS Take 1 tablet twice a day by oral route.     carbamazepine  (TEGRETOL  XR) 100 MG 12 hr tablet TAKE 5 TABLETS BY MOUTH TWICE DAILY 900 tablet 0   clopidogrel (PLAVIX) 75 MG tablet Take 1 tablet (75 mg total) by mouth daily for 19 days. 19 tablet 0   Multiple Vitamins-Minerals (ONE-A-DAY 50 PLUS PO) Take 1 tablet by mouth daily. gummies     PHENobarbital  (LUMINAL) 64.8 MG tablet TAKE 2 TABLETS BY MOUTH AT BEDTIME 180 tablet 3   sodium chloride  1 g tablet Take 1 tablet (1 g total) by mouth 2 (two) times daily with a meal. Continue a 1.5 liter fluid restriction to the best of your ability.  Follow up with your PCP within 1 week for repeat labs. 60 tablet 0   zinc gluconate 50 MG tablet Take 50 mg by mouth daily.     No facility-administered medications prior to visit.    PAST MEDICAL HISTORY: Past Medical History:  Diagnosis Date   Allergy    Arthritis    elbows   Cataract    Osteopenia    Seizure (HCC)    last seizure 63, onset age 44   Seizure (HCC)     PAST SURGICAL HISTORY: Past Surgical History:  Procedure Laterality Date   ANKLE FRACTURE SURGERY  07-05-08    FAMILY HISTORY: Family History  Problem Relation Age of Onset   Alzheimer's disease Father    Breast cancer Maternal Aunt    Colon cancer Neg Hx    Esophageal cancer Neg Hx    Rectal cancer Neg Hx    Stomach cancer Neg Hx     SOCIAL HISTORY: Social History   Socioeconomic History   Marital status: Married    Spouse name: Ozell   Number of children: 0   Years of education: 12   Highest education level: Not on file  Occupational History    Employer: [employer]  Tobacco Use   Smoking status: Never   Smokeless tobacco: Never  Vaping Use   Vaping status: Never Used  Substance and Sexual Activity   Alcohol use: No   Drug use: Never   Sexual activity: Not Currently  Other Topics Concern   Not on file  Social History Narrative   Patient is married to [place]. 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 Patient has a high school education.   Patient is right-handed.   Patient drinks about 2 glasses of soda daily.   Social Drivers of Corporate investment banker Strain: Low Risk  (05/07/2023)   Overall Financial Resource Strain (CARDIA)    Difficulty of Paying Living Expenses: Not hard at all  Food Insecurity: No Food Insecurity (04/06/2024)   Hunger Vital Sign    Worried About Running Out of Food in the Last Year: Never true    Ran Out of Food in the Last Year: Never true  Transportation Needs: No Transportation Needs (04/06/2024)   PRAPARE - Administrator, Civil Service (Medical): No    Lack of Transportation (Non-Medical): No  Physical Activity: Sufficiently Active (05/07/2023)   Exercise Vital Sign    Days of Exercise per Week: 5 days    Minutes of Exercise per Session: 40 min  Stress: No Stress Concern Present (05/07/2023)   [employer] of Occupational Health - Occupational Stress Questionnaire    Feeling of Stress : Not at all  Social Connections: Socially Isolated (04/06/2024)   Social Connection and Isolation Panel    Frequency of Communication with Friends and Family: Twice a week    Frequency of Social Gatherings with Friends and Family: Three times a week    Attends Religious Services: Never    Active Member of Clubs or Organizations: No    Attends Banker Meetings: Never    Marital Status: Widowed  Intimate Partner Violence: Not At Risk (05/07/2023)   Humiliation, Afraid, Rape, and Kick questionnaire    Fear of Current or  Ex-Partner: No    Emotionally Abused: No    Physically Abused: No    Sexually Abused: No      PHYSICAL EXAM  There were no vitals filed for this visit.   There is no height or weight on file to calculate BMI.  Generalized: Well developed, in no acute distress  Cardiology: normal rate and rhythm, no murmur noted Respiratory: clear to auscultation bilaterally  Neurological examination  Mentation: Alert oriented to time, place, history taking. Follows all commands speech and language fluent Cranial nerve II-XII: Pupils were equal round reactive to light. Extraocular movements were full, visual field were full on confrontational test. Facial sensation and strength were normal. Uvula tongue midline. Head turning and shoulder shrug  were normal and symmetric. Motor: The motor testing reveals 5 over 5 strength of all 4 extremities. Good symmetric motor tone is noted throughout.  Sensory: Sensory testing is intact to soft touch on all 4 extremities. No evidence of extinction is noted.  Coordination: Cerebellar testing reveals good finger-nose-finger and heel-to-shin bilaterally.  Gait and station: Gait is normal.  DTR: normal and symmetric bilaterally   DIAGNOSTIC DATA (LABS, IMAGING, TESTING) - I reviewed patient records, labs, notes, testing and imaging myself where available.      No data to display           Lab Results  Component Value Date   WBC 3.9 (L) 04/11/2024   HGB 11.7 (L) 04/11/2024   HCT 33.9 (L) 04/11/2024   MCV 98.4 04/11/2024   PLT 254.0 04/11/2024      Component Value Date/Time   NA 126 (L) 04/11/2024 1153   NA 129 (L) 04/20/2023 1516   K 4.9 04/11/2024 1153   CL 88 (L) 04/11/2024 1153   CO2 28 04/11/2024 1153   GLUCOSE 90 04/11/2024 1153   BUN 8 04/11/2024 1153   BUN 10 04/20/2023 1516   CREATININE 0.33 (L) 04/11/2024 1153   CREATININE 0.42 (L) 11/27/2023 1612   CALCIUM  9.2 04/11/2024 1153   PROT 7.3 04/11/2024 1153   PROT 7.0 08/08/2020 0859    ALBUMIN 4.5 04/11/2024 1153   ALBUMIN 4.3 08/08/2020 0859   AST 38 (H) 04/11/2024 1153  ALT 25 04/11/2024 1153   ALKPHOS 114 04/11/2024 1153   BILITOT 0.4 04/11/2024 1153   BILITOT <0.2 08/08/2020 0859   GFRNONAA >60 04/07/2024 1530   GFRAA 114 08/08/2020 0859   Lab Results  Component Value Date   CHOL 200 04/07/2024   HDL 86 04/07/2024   LDLCALC 108 (H) 04/07/2024   TRIG 28 04/07/2024   CHOLHDL 2.3 04/07/2024   Lab Results  Component Value Date   HGBA1C 4.9 04/07/2024   No results found for: [id number] Lab Results  Component Value Date   TSH 2.49 08/31/2023       ASSESSMENT AND PLAN 68 y.o. year old female  has a past medical history of Allergy, Arthritis, Cataract, Osteopenia, Seizure (HCC), and Seizure (HCC). here with   No diagnosis found.     Ananda is doing well on carbamazepine  and phenobarbital  as prescribed, however, recent lab abnormalities suggest need to decrease carbamazepine . I will have her continue current regimen of phenobarbital  129.6mg  daily and bedtime and carbamazepine  500mg  BID for now. We will add levetiracetam  250mg  BID. Once she has taken levetiracetam  250mg  BID, she will decrease carbamazepine  dose to 400mg  BID. She will call me for any concerns of side effects or seizure like activity. We have deferred labs, today, as she had sodium rechecked this morning. She has significant bruising where labs were attempted from both arms. Phenobarbital  level normal 09/2020. Will plan to repeat labs at next visit. She will use caution with driving and not drive if she is not feeling well. Adequate hydration, well balanced diet and regular exercise advised. She will follow up with Dr Gregg in 4 weeks for review of case and adjustments in treatment plan as indicated. She verbalizes understanding and agreement with this plan.    No orders of the defined types were placed in this encounter.     No orders of the defined types were placed in this encounter.       Greig Forbes, FNP-C 04/18/2024, 4:37 PM [facility] [address] [place], [place] [address] [phone]

## 2024-04-18 NOTE — Patient Instructions (Incomplete)
 Below is our plan:  We will continue carbamazepine  500mg  twice dail and phenobarbital  129.6mg  daily at bedtime. Make sure to continue sodium tablet. Stay hydrated.   You soul be able to discontinue Plavix 10/20. Make sure to discuss with vascular surgeon if they feel you will need to continue based on carotid stenosis.   Please make sure you are consistent with timing of seizure medication. I recommend annual visit with primary care provider (PCP) for complete physical and routine blood work. I recommend daily intake of vitamin D  (400-800iu) and calcium  (800-1000mg ) for bone health. Discuss Dexa screening with PCP.   According to [place] law, you can not drive unless you are seizure / syncope free for at least 6 months and under physician's care.  Please maintain precautions. Do not participate in activities where a loss of awareness could harm you or someone else. No swimming alone, no tub bathing, no hot tubs, no driving, no operating motorized vehicles (cars, ATVs, motocycles, etc), lawnmowers, power tools or firearms. No standing at heights, such as rooftops, ladders or stairs. Avoid hot objects such as stoves, heaters, open fires. Wear a helmet when riding a bicycle, scooter, skateboard, etc. and avoid areas of traffic. Set your water heater to 120 degrees or less.  SUDEP is the sudden, unexpected death of someone with epilepsy, who was otherwise healthy. In SUDEP cases, no other cause of death is found when an autopsy is done. Each year, more than 1 in 1,000 people with epilepsy die from SUDEP. This is the leading cause of death in people with uncontrolled seizures. Until further answers are available, the best way to prevent SUDEP is to lower your risk by controlling seizures. Research has found that people with all types of epilepsy that experience convulsive seizures can be at risk.  Please make sure you are staying well hydrated. I recommend 50-60 ounces daily. Well balanced diet and regular  exercise encouraged. Consistent sleep schedule with 6-8 hours recommended.   Please continue follow up with care team as directed.   Follow up with me in 1  year   You may receive a survey regarding today's visit. I encourage you to leave honest feed back as I do use this information to improve patient care. Thank you for seeing me today!

## 2024-04-19 ENCOUNTER — Encounter: Payer: Self-pay | Admitting: Family Medicine

## 2024-04-19 ENCOUNTER — Ambulatory Visit (INDEPENDENT_AMBULATORY_CARE_PROVIDER_SITE_OTHER): Payer: Medicare Other | Admitting: Family Medicine

## 2024-04-19 VITALS — BP 120/75 | HR 69 | Ht 64.0 in | Wt 108.0 lb

## 2024-04-19 DIAGNOSIS — G40909 Epilepsy, unspecified, not intractable, without status epilepticus: Secondary | ICD-10-CM

## 2024-04-19 DIAGNOSIS — Z5181 Encounter for therapeutic drug level monitoring: Secondary | ICD-10-CM

## 2024-04-19 DIAGNOSIS — E7849 Other hyperlipidemia: Secondary | ICD-10-CM

## 2024-04-19 MED ORDER — CARBAMAZEPINE ER 100 MG PO TB12: 500.0000 mg | ORAL_TABLET | Freq: Two times a day (BID) | ORAL | 3 refills | Status: AC

## 2024-04-19 MED ORDER — PHENOBARBITAL 64.8 MG PO TABS: 129.6000 mg | ORAL_TABLET | Freq: Every day | ORAL | 1 refills | Status: AC

## 2024-04-20 ENCOUNTER — Ambulatory Visit: Payer: Self-pay | Admitting: Family Medicine

## 2024-04-20 LAB — BASIC METABOLIC PANEL WITH GFR
BUN/Creatinine Ratio: 22 (ref 12–28)
BUN: 9 mg/dL (ref 8–27)
CO2: 23 mmol/L (ref 20–29)
Calcium: 8.9 mg/dL (ref 8.7–10.3)
Chloride: 87 mmol/L — ABNORMAL LOW (ref 96–106)
Creatinine, Ser: 0.41 mg/dL — ABNORMAL LOW (ref 0.57–1.00)
Glucose: 86 mg/dL (ref 70–99)
Potassium: 4.4 mmol/L (ref 3.5–5.2)
Sodium: 125 mmol/L — ABNORMAL LOW (ref 134–144)
eGFR: 106 mL/min/1.73 (ref >59)

## 2024-04-20 LAB — CARBAMAZEPINE LEVEL, TOTAL: Carbamazepine (Tegretol), S: 10.5 ug/mL (ref 4.0–12.0)

## 2024-04-28 ENCOUNTER — Other Ambulatory Visit (HOSPITAL_COMMUNITY): Payer: Self-pay

## 2024-04-28 ENCOUNTER — Ambulatory Visit (HOSPITAL_COMMUNITY)
Admission: RE | Admit: 2024-04-28 | Discharge: 2024-04-28 | Disposition: A | Discharge status: Home / Self Care | Source: Ambulatory Visit | Attending: Surgery | Admitting: Surgery

## 2024-04-28 ENCOUNTER — Ambulatory Visit (INDEPENDENT_AMBULATORY_CARE_PROVIDER_SITE_OTHER): Admitting: Vascular Surgery

## 2024-04-28 ENCOUNTER — Encounter: Payer: Self-pay | Admitting: Vascular Surgery

## 2024-04-28 VITALS — BP 153/68 | HR 61 | Temp 97.9°F | Resp 18 | Ht 64.0 in | Wt 107.9 lb

## 2024-04-28 DIAGNOSIS — I6523 Occlusion and stenosis of bilateral carotid arteries: Secondary | ICD-10-CM

## 2024-04-28 DIAGNOSIS — R0989 Other specified symptoms and signs involving the circulatory and respiratory systems: Secondary | ICD-10-CM | POA: Insufficient documentation

## 2024-04-28 MED ORDER — CLOPIDOGREL BISULFATE 75 MG PO TABS
75.0000 mg | ORAL_TABLET | Freq: Every day | ORAL | 6 refills | Status: AC
  Filled 2024-04-28: qty 30, 30d supply, fill #0
  Filled 2024-05-20: qty 30, 30d supply, fill #1
  Filled 2024-05-23: qty 30, 30d supply, fill #0
  Filled 2024-05-23 – 2024-06-21 (×2): qty 30, 30d supply, fill #1
  Filled 2024-07-16 – 2024-07-21 (×2): qty 30, 30d supply, fill #2

## 2024-04-28 NOTE — Progress Notes (Signed)
 Office Note     CC: Asymptomatic bilateral carotid artery stenosis, left greater than right. Requesting Provider:  Jerri Pfeiffer, MD  HPI: Brenda Woods is a 68 y.o. (16-Nov-1955) female presenting at the request of Dr. Jerri for asymptomatic bilateral carotid artery stenosis, left greater than right.  Brenda Woods was referred after recent hospitalization for what was believed to be axial overdose of carbamazepine .  Levels were elevated on admission.  Symptoms included ataxia, nausea vomiting, nystagmus.  TIA cannot be ruled out but was low on the differential.  She underwent CTA head and neck demonstrating critical stenosis of the left ICA, moderate stenosis of the right.    On exam today, Brenda Woods was doing well, accompanied by her sister.  A native of [place], she graduated from [employer].  During our discussion today regarding her hospitalization she denied weakness, but stated she could not walk due to dizziness.  She was otherwise 'out of it'.  Since the event, she denied symptoms of TIA, stroke, amaurosis.  Has some intermittent dizziness which resolves without issue.  History includes epilepsy-well-controlled, last seizure over a decade ago.  The pt is  on a statin for cholesterol management.  The pt is  on a daily aspirin.   Other AC:  plavix The pt is  on medication for hypertension.   The pt is - diabetic.  Tobacco hx:  -  Past Medical History:  Diagnosis Date   Allergy    Arthritis    elbows   Carotid artery occlusion    Cataract    Osteopenia    Seizure (HCC)    last seizure 18, onset age 27   Seizure [facility])     Past Surgical History:  Procedure Laterality Date   ANKLE FRACTURE SURGERY  07-05-08    Social History   Socioeconomic History   Marital status: Married    Spouse name: Brenda Woods   Number of children: 0   Years of education: 12   Highest education level: Not on file  Occupational History    Employer: [employer]   Tobacco Use   Smoking status: Never   Smokeless tobacco: Never  Vaping Use   Vaping status: Never Used  Substance and Sexual Activity   Alcohol use: No   Drug use: Never   Sexual activity: Not Currently  Other Topics Concern   Not on file  Social History Narrative   Patient is married to Brenda Woods. 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 Patient has a high school education.   Patient is right-handed.   Patient drinks about 2 glasses of soda daily.   Social Drivers of Corporate investment banker Strain: Low Risk  (05/07/2023)   Overall Financial Resource Strain (CARDIA)    Difficulty of Paying Living Expenses: Not hard at all  Food Insecurity: No Food Insecurity (04/06/2024)   Hunger Vital Sign    Worried About Running Out of Food in the Last Year: Never true    Ran Out of Food in the Last Year: Never true  Transportation Needs: No Transportation Needs (04/06/2024)   PRAPARE - Administrator, Civil Service (Medical): No    Lack of Transportation (Non-Medical): No  Physical Activity: Sufficiently Active (05/07/2023)   Exercise Vital Sign    Days of Exercise per Week: 5 days    Minutes of Exercise per Session: 40 min  Stress: No Stress Concern Present (  05/07/2023)   [employer] of Occupational [employer] - Occupational Stress Questionnaire    Feeling of Stress : Not at all  Social Connections: Socially Isolated (04/06/2024)   Social Connection and Isolation Panel    Frequency of Communication with Friends and Family: Twice a week    Frequency of Social Gatherings with Friends and Family: Three times a week    Attends Religious Services: Never    Active Member of Clubs or Organizations: No    Attends Banker Meetings: Never    Marital Status: Widowed  Intimate Partner Violence: Not At Risk (05/07/2023)   Humiliation, Afraid, Rape, and Kick questionnaire    Fear of Current or Ex-Partner: No     Emotionally Abused: No    Physically Abused: No    Sexually Abused: No   Family History  Problem Relation Age of Onset   Alzheimer's disease Father    Breast cancer Maternal Aunt    Colon cancer Neg Hx    Esophageal cancer Neg Hx    Rectal cancer Neg Hx    Stomach cancer Neg Hx     Current Outpatient Medications  Medication Sig Dispense Refill   aspirin EC 81 MG tablet Take 1 tablet (81 mg total) by mouth daily. Swallow whole. 90 tablet 0   atorvastatin  (LIPITOR) 40 MG tablet Take 1 tablet (40 mg total) by mouth daily. 30 tablet 0   Calcium  Carb-Cholecalciferol 600-10 MG-MCG TABS Take 1 tablet twice a day by oral route.     carbamazepine  (TEGRETOL  XR) 100 MG 12 hr tablet Take 5 tablets (500 mg total) by mouth 2 (two) times daily. 900 tablet 3   clopidogrel (PLAVIX) 75 MG tablet Take 1 tablet (75 mg total) by mouth daily. 30 tablet 6   Multiple Vitamins-Minerals (ONE-A-DAY 50 PLUS PO) Take 1 tablet by mouth daily. gummies     PHENobarbital  (LUMINAL) 64.8 MG tablet Take 2 tablets (129.6 mg total) by mouth at bedtime. 180 tablet 1   sodium chloride  1 g tablet Take 1 tablet (1 g total) by mouth 2 (two) times daily with a meal. Continue a 1.5 liter fluid restriction to the best of your ability.  Follow up with your PCP within 1 week for repeat labs. 60 tablet 0   zinc gluconate 50 MG tablet Take 50 mg by mouth daily.     No current facility-administered medications for this visit.    No Known Allergies   REVIEW OF SYSTEMS:  [X]  denotes positive finding, [ ]  denotes negative finding Cardiac  Comments:  Chest pain or chest pressure:    Shortness of breath upon exertion:    Short of breath when lying flat:    Irregular heart rhythm:        Vascular    Pain in calf, thigh, or hip brought on by ambulation:    Pain in feet at night that wakes you up from your sleep:     Blood clot in your veins:    Leg swelling:         Pulmonary    Oxygen at home:    Productive cough:      Wheezing:         Neurologic    Sudden weakness in arms or legs:     Sudden numbness in arms or legs:     Sudden onset of difficulty speaking or slurred speech:    Temporary loss of vision in one eye:     Problems with dizziness:  Gastrointestinal    Blood in stool:     Vomited blood:         Genitourinary    Burning when urinating:     Blood in urine:        Psychiatric    Major depression:         Hematologic    Bleeding problems:    Problems with blood clotting too easily:        Skin    Rashes or ulcers:        Constitutional    Fever or chills:      PHYSICAL EXAMINATION:  Vitals:   04/28/24 1519 04/28/24 1522  BP: (!) 160/65 (!) 153/68  Pulse: 61   Resp: 18   Temp: 97.9 F (36.6 C)   TempSrc: Temporal   SpO2: 99%   Weight: 107 lb 14.4 oz (48.9 kg)   Height: 5' 4 (1.626 m)     General:  WDWN in NAD; vital signs documented above Gait: Not observed HENT: WNL, normocephalic Pulmonary: normal non-labored breathing , without wheezing Cardiac: regular HR Abdomen: soft, NT, no masses Skin: without rashes Vascular Exam/Pulses:  Right Left  Radial 2+ (normal) 2+ (normal)  Ulnar    Femoral    Popliteal    DP    PT     Extremities: without ischemic changes, without Gangrene , without cellulitis; without open wounds;  Musculoskeletal: no muscle wasting or atrophy  Neurologic: A&O X 3;  No focal weakness or paresthesias are detected Psychiatric:  The pt has Normal affect.   Non-Invasive Vascular Imaging:   Ultrasound demonstrated no significant stenosis.  I question the validity due to CTA findings demonstrating high-grade stenosis.    ASSESSMENT/PLAN: Masiel Gentzler is a 68 y.o. female presenting with asymptomatic bilateral carotid artery stenosis, left greater than right.  While there is some discrepancy between the ultrasound and CT angio, the CT angio demonstrates a 90% stenosis.  I had a long discussion with Amariya regarding the above,  most notably that with 90% stenosis of her left ICA, she has a 11% risk of stroke over the next 5 years with best medical management.  With surgery, this lowers to 5%.  In evaluating her imaging, I think that she is an excellent candidate for carotid endarterectomy.  I think she is a poor candidate for TCAR.  After discussing risks and benefits of both, she elected to proceed with carotid endarterectomy.  I would like for her to be seen by my colleague, Dr. Jeffrie, cardiology for preoperative risk assessment.  Last echo was performed while she was hospitalized in early October demonstrating normal ejection fraction and normal left ventricular function.  My plan is to call her prior to the procedure to answer any and all questions that arise in the interim.  I asked that she continue aspirin, Plavix, high intensity statin.  Fonda FORBES Rim, MD [facility] [phone]

## 2024-05-02 ENCOUNTER — Telehealth (HOSPITAL_BASED_OUTPATIENT_CLINIC_OR_DEPARTMENT_OTHER): Payer: Self-pay

## 2024-05-02 NOTE — Telephone Encounter (Signed)
   Pre-operative Risk Assessment    Patient Name: Brenda Woods  DOB: Dec 28, 1955 MRN: [id number]   Date of last office visit: 01/18/24 with Dr. Mona Date of next office visit: NA   Request for Surgical Clearance    Procedure:  Left CEA  Date of Surgery:  Clearance TBD                                 Surgeon:  Not indicated Surgeon's Group or Practice Name:  [facility]  Phone number:  [phone] Fax number:  [phone]   Type of Clearance Requested:   - Medical  - Pharmacy:  Hold Aspirin and Clopidogrel (Plavix) not indicated   Type of Anesthesia:  General    Additional requests/questions:    Bonney Augustin JONETTA Delores   05/02/2024, 4:17 PM

## 2024-05-03 NOTE — Telephone Encounter (Signed)
 Pt has been scheduled new pt appt with Dr. Loni 05/05/24.

## 2024-05-03 NOTE — Telephone Encounter (Signed)
 Dr. Lanis replied back: sounds good, Sorry it was pt request. I need first available.   I will call the pt and schedule a 1st available new pt appt with MD.    I left message to call back to schedule new pt appt.

## 2024-05-03 NOTE — Telephone Encounter (Signed)
 I confirmed with our scheduling team if appt with Dr. Jeffrie would be considered new pt as pt only see's DR. Hilty for Hyperlipidemia.   Per Luke T yes new pt with DR. Skains.    I have reached out to Dr. Lanis thru secure chat:  Dr. Lanis sorry to bother you sir. I received notes in your preop clearance for pt to see DR. Skains. Skains. pt will be a new pt to Dr. Jeffrie as she only see's our practice with lipid clinic with Dr. Mona.  I have reviewed schedules for Dr. Jeffrie and his 1st new pt that I can get the pt in is 06/17/24. not sure if you would be ok with the pt seeing another cardiologist?

## 2024-05-03 NOTE — Telephone Encounter (Signed)
   Name: Mirta Mally  DOB: [birth year]  MRN: [id number]  Primary Cardiologist: None  Chart reviewed as part of pre-operative protocol coverage. Because of Empress Newmann Wuebker's past medical history and time since last visit, she will require a follow-up in-office visit in order to better assess preoperative cardiovascular risk.  Upon review of Dr. Lanis of vascular surgery's note on 04/28/2024: I would like for her to be seen by my colleague, Dr. Jeffrie, cardiology for preoperative risk assessment.  Last echo was performed while she was hospitalized in early October demonstrating normal ejection fraction and normal left ventricular function.  Please schedule an office visit with Dr. Jeffrie.  Looks like she only saw Dr. Mona for hyperlipidemia.  Pre-op covering staff: - Please schedule appointment and call patient to inform them. If patient already had an upcoming appointment within acceptable timeframe, please add pre-op clearance to the appointment notes so provider is aware. - Please contact requesting surgeon's office via preferred method (i.e, phone, fax) to inform them of need for appointment prior to surgery.  Aspirin and clopidogrel are managed by vascular surgery.  Lum LITTIE Louis, NP  05/03/2024, 8:12 AM

## 2024-05-04 NOTE — Telephone Encounter (Signed)
 If needs to be sooner for preop evaluation, we can possibly bring her in sooner. Lyle, can you check please?  Thanks MJP

## 2024-05-04 NOTE — Telephone Encounter (Signed)
 We can see her on 11/10 if she needs sooner appointment.

## 2024-05-04 NOTE — Telephone Encounter (Signed)
 Pt called back to the scheduling team looks like this morning and changed her new pt appt.    Pt is now seeing Dr. Elmira 06/07/24.

## 2024-05-05 ENCOUNTER — Ambulatory Visit: Admitting: Internal Medicine

## 2024-05-05 NOTE — Assessment & Plan Note (Signed)
 Pt has hx of similar.  Thought to be due to elevated Tegretol  level.  She was started on salt tabs and water restrictions.  Na was up to 124 at time of D/C- repeat today.

## 2024-05-05 NOTE — Assessment & Plan Note (Signed)
 New.  Pt's Carbamazepine  level was 13.8 on admission.  It was down to 5.7 on day of d/c and she resumed home dose.  She has f/u w/ neuro scheduled.  Check level today to make sure it is not again elevated.

## 2024-05-05 NOTE — Telephone Encounter (Signed)
 S/w the pt and she has declined the sooner appt as she tells me here sister wants to come as well and she cannot come until Dec  2025. The pt thanked us  for thinking of her and trying to get her in sooner. I will update all parties.

## 2024-05-05 NOTE — Telephone Encounter (Signed)
 Left message for the pt to call back, that Dr. Evern said he could see her sooner. 05/16/24.

## 2024-05-05 NOTE — Assessment & Plan Note (Addendum)
 New.  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  Was started on ASA and Plavix.  She has appts upcoming w/ neuro and vascular.  Has some dull occipital pain for last 3 days but no acute CVA sxs.  Pt is aware she needs to tell her family or friends if she develops any new or different sxs.

## 2024-05-20 ENCOUNTER — Other Ambulatory Visit (HOSPITAL_COMMUNITY): Payer: Self-pay

## 2024-05-23 ENCOUNTER — Other Ambulatory Visit (HOSPITAL_COMMUNITY): Payer: Self-pay

## 2024-05-23 ENCOUNTER — Other Ambulatory Visit: Payer: Self-pay

## 2024-05-26 ENCOUNTER — Other Ambulatory Visit (HOSPITAL_COMMUNITY): Payer: Self-pay

## 2024-06-06 ENCOUNTER — Other Ambulatory Visit: Payer: Self-pay | Admitting: Neurology

## 2024-06-06 ENCOUNTER — Other Ambulatory Visit: Payer: Self-pay | Admitting: Family Medicine

## 2024-06-07 ENCOUNTER — Encounter: Payer: Self-pay | Admitting: Cardiology

## 2024-06-07 ENCOUNTER — Ambulatory Visit: Attending: Cardiology | Admitting: Cardiology

## 2024-06-07 ENCOUNTER — Other Ambulatory Visit (HOSPITAL_COMMUNITY): Payer: Self-pay

## 2024-06-07 VITALS — BP 147/70 | HR 66 | Ht 64.0 in | Wt 107.0 lb

## 2024-06-07 DIAGNOSIS — E782 Mixed hyperlipidemia: Secondary | ICD-10-CM | POA: Diagnosis not present

## 2024-06-07 DIAGNOSIS — I700 Atherosclerosis of aorta: Secondary | ICD-10-CM | POA: Diagnosis not present

## 2024-06-07 DIAGNOSIS — Z01818 Encounter for other preprocedural examination: Secondary | ICD-10-CM | POA: Insufficient documentation

## 2024-06-07 DIAGNOSIS — I6523 Occlusion and stenosis of bilateral carotid arteries: Secondary | ICD-10-CM | POA: Insufficient documentation

## 2024-06-07 DIAGNOSIS — E7841 Elevated Lipoprotein(a): Secondary | ICD-10-CM | POA: Diagnosis not present

## 2024-06-07 DIAGNOSIS — E785 Hyperlipidemia, unspecified: Secondary | ICD-10-CM | POA: Diagnosis not present

## 2024-06-07 MED ORDER — METOPROLOL TARTRATE 100 MG PO TABS
100.0000 mg | ORAL_TABLET | Freq: Once | ORAL | 0 refills | Status: AC
  Filled 2024-06-07 – 2024-06-17 (×2): qty 1, 1d supply, fill #0

## 2024-06-07 NOTE — Progress Notes (Signed)
 Cardiology Office Note:  .   Date:  06/07/2024  ID:  Brenda Woods, DOB 25-Feb-1956, MRN [id number] PCP: Mahlon Comer BRAVO, MD  [facility] Providers Cardiologist:  Newman Lawrence, MD PCP: Mahlon Comer BRAVO, MD  Chief Complaint  Patient presents with   Preop workup     Brenda Woods is a 68 y.o. female with hypertension, hyperlipidemia, elevated lipoprotein a, severe coronary artery disease, chronic hyponatremia  Discussed the use of AI scribe software for clinical note transcription with the patient, who gave verbal consent to proceed.  History of Present Illness Brenda Woods is a 68 year old female with significant carotid artery stenosis who presents for perioperative cardiac risk assessment. She was referred by Dr. Silver for evaluation of her perioperative cardiac risk.  She has significant carotid artery stenosis with 90% narrowing on the left and 60% on the right. She has no chest pain or shortness of breath with daily activities such as house cleaning. She lives independently but does not perform structured exercise.  She had dizziness in October that prompted an MRI of the brain, which showed chronic small vessel changes. 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  Her cholesterol decreased from 247 to 108 on atorvastatin . She has elevated lipoprotein(a) and significant carotid plaque, and her cholesterol regimen is being reassessed.  She has chronic low sodium and takes a daily sodium tablet, but her sodium levels remain low.      Vitals:   06/07/24 0938  BP: (!) 147/70  Pulse: 66  SpO2: 99%      Review of Systems  Cardiovascular:  Negative for chest pain, dyspnea on exertion, leg swelling, palpitations and syncope.        Studies Reviewed: SABRA        EKG 06/07/2024: Normal sinus rhythm Normal ECG When compared with ECG of  06-Apr-2024 14:08,  FIrst degree AV block absent     MRI brain 04/2024: 1. No acute intracranial abnormality. 2. Multifocal hyperintense T2-weighted signal within the cerebral white matter, most commonly due to chronic small vessel disease.    Echocardiogram 04/2024:  1. Left ventricular ejection fraction, by estimation, is 65 to 70%. Left  ventricular ejection fraction by 2D MOD biplane is 68.4 %. The left  ventricle has normal function. The left ventricle has no regional wall  motion abnormalities. Left ventricular  diastolic parameters were normal.   2. Right ventricular systolic function is normal. The right ventricular  size is normal. There is normal pulmonary artery systolic pressure. The  estimated right ventricular systolic pressure is 24.0 mmHg.   3. The mitral valve is normal in structure. No evidence of mitral valve  regurgitation.   4. The aortic valve is tricuspid. Aortic valve regurgitation is not  visualized. Aortic valve sclerosis/calcification is present, without any  evidence of aortic stenosis. Aortic valve mean gradient measures 4.0 mmHg.   5. The inferior vena cava is normal in size with <50% respiratory  variability, suggesting right atrial pressure of 8 mmHg.   Carotid duplex 04/2024: Right Carotid: Velocities in the right ICA are consistent with a 1-39%  stenosis. The ECA appears <50% stenosed.  Left Carotid: Velocities in the left ICA are consistent with a 1-39%  stenosis. The ECA appears <50% stenosed.  Vertebrals:  Bilateral vertebral arteries demonstrate antegrade flow.  Subclavians: Normal flow hemodynamics were seen in bilateral subclavian  arteries.   Labs 04/2024: Chol 200, TG 28, HDL 86, LDL 108 HbA1C 4.9% Hb 11.7 Cr 0.41, Na 125, AST 38  02/2023: Chol 354, TG 69, HDL 93, LDL 247   Physical Exam Vitals and nursing note reviewed.  Constitutional:      General: She is not in acute distress. Neck:     Vascular: No JVD.   Cardiovascular:     Rate and Rhythm: Normal rate and regular rhythm.     Heart sounds: Normal heart sounds. No murmur heard. Pulmonary:     Effort: Pulmonary effort is normal.     Breath sounds: Normal breath sounds. No wheezing or rales.  Musculoskeletal:     Right lower leg: No edema.     Left lower leg: No edema.      VISIT DIAGNOSES:   ICD-10-CM   1. Mixed hyperlipidemia  E78.2 CT CORONARY MORPH W/CTA COR W/SCORE W/CA W/CM &/OR WO/CM    metoprolol  tartrate (LOPRESSOR ) 100 MG tablet    Basic metabolic panel with GFR    AMB Referral to [facility] Pharm-D    2. Aortic atherosclerosis  I70.0 EKG 12-Lead    3. Elevated lipoprotein A level  E78.41 CT CORONARY MORPH W/CTA COR W/SCORE W/CA W/CM &/OR WO/CM    metoprolol  tartrate (LOPRESSOR ) 100 MG tablet    Basic metabolic panel with GFR    AMB Referral to [facility] Pharm-D    4. Bilateral carotid artery stenosis  I65.23 CT CORONARY MORPH W/CTA COR W/SCORE W/CA W/CM &/OR WO/CM    metoprolol  tartrate (LOPRESSOR ) 100 MG tablet    Basic metabolic panel with GFR    AMB Referral to [facility] Pharm-D    5. Pre-op evaluation  Z01.818 CT CORONARY MORPH W/CTA COR W/SCORE W/CA W/CM &/OR WO/CM    metoprolol  tartrate (LOPRESSOR ) 100 MG tablet    Basic metabolic panel with GFR    AMB Referral to [facility] Pharm-D       Brenda Woods is a 68 y.o. female with  hypertension, hyperlipidemia, elevated lipoprotein a, severe coronary artery disease, chronic hyponatremia Assessment & Plan Severe bilateral carotid artery stenosis (left greater than right): No appreciable bruit on exam, and questionably unremarkable ultrasound result, but CTA showing Severe stenosis with 90% on the left and 60% on the right. Asymptomatic but high risk for progression due to hyperlipidemia and elevated lipoprotein(a).  LDL down from 247-108 on Crestor  20 mg daily.  However, I would like to bring LDL down below 70, preferably with PCSK9 elevators given her elevated  lipoprotein a as well. Referred to lipid clinic. Continue aspirin  and Plavix . Anticipate left carotid endarterectomy in near future, following cardiac restratification workup.  Preop evaluation: High risk for coronary artery disease given hyperlipidemia, elevated lipoprotein a, as well as known severe coronary artery disease. Baseline functional capacity is limited which limits ability to assess for any symptoms. Recommend coronary CT angiogram for definitive evaluation, mainly to rule out any left main or and severe multivessel disease before proceeding with high risk vascular procedure right carotid endarterectomy.  Chronic hyponatremia: Chronic low sodium levels despite supplementation. Levels consistently low, management may need reassessment. - Discuss chronic hyponatremia management with primary care physician. - Consider referral to nephrology for further evaluation and management of chronic hyponatremia.      Meds ordered this encounter  Medications   metoprolol  tartrate (LOPRESSOR ) 100 MG tablet    Sig: Take 1 tablet (100 mg total) by mouth once for 1 dose. Take 90-120 minutes prior to scan.  Hold for SBP less than 110.    Dispense:  1 tablet    Refill:  0     F/u in 3 months  Signed, Newman JINNY Lawrence, MD

## 2024-06-07 NOTE — Patient Instructions (Addendum)
 Medication Instructions:   ON DAY OF CT: metoprolol tartrate (LOPRESSOR) 100 MG tablet         Take 1 tablet (100 mg total) by mouth once for 1 dose. Take 90-120 minutes prior to scan. Hold for SBP less than 110.    *If you need a refill on your cardiac medications before your next appointment, please call your pharmacy*  Lab Work: BMP  If you have labs (blood work) drawn today and your tests are completely normal, you will receive your results only by: MyChart Message (if you have MyChart) OR A paper copy in the mail If you have any lab test that is abnormal or we need to change your treatment, we will call you to review the results.  Testing/Procedures: CORONARY CTA    Your cardiac CT will be scheduled at one of the below locations:   [facility] [address]  [place], [place] [address]  If scheduled at the [facility] at [address], please enter the parking lot using the [address] entrance and use the FREE valet service at the patient drop-off area. Enter the building and check-in with registration on the main floor.  Please follow these instructions carefully (unless otherwise directed):  An IV will be required for this test and Nitroglycerin will be given.  Hold all erectile dysfunction medications at least 3 days (72 hrs) prior to test. (Ie viagra, cialis, sildenafil, tadalafil, etc)   On the Night Before the Test: Be sure to Drink plenty of water. Do not consume any caffeinated/decaffeinated beverages or chocolate 12 hours prior to your test. Do not take any antihistamines 12 hours prior to your test.  On the Day of the Test: Drink plenty of water until 1 hour prior to the test. Do not eat any food 1 hour prior to test. You may take your regular medications prior to the test.  Take metoprolol (Lopressor) two hours prior to test. If you take Furosemide/Hydrochlorothiazide/Spironolactone/Chlorthalidone, please  HOLD on the morning of the test. Patients who wear a continuous glucose monitor MUST remove the device prior to scanning. FEMALES- please wear underwire-free bra if available, avoid dresses & tight clothing      After the Test: Drink plenty of water. After receiving IV contrast, you may experience a mild flushed feeling. This is normal. On occasion, you may experience a mild rash up to 24 hours after the test. This is not dangerous. If this occurs, you can take Benadryl 25 mg, Zyrtec, Claritin , or Allegra and increase your fluid intake. (Patients taking Tikosyn should avoid Benadryl, and may take Zyrtec, Claritin , or Allegra) If you experience trouble breathing, this can be serious. If it is severe call 911 IMMEDIATELY. If it is mild, please call our office.  We will call to schedule your test 2-4 weeks out understanding that some insurance companies will need an authorization prior to the service being performed.   For more information and frequently asked questions, please visit our website : [email or web address]  For non-scheduling related questions, please contact the cardiac imaging nurse navigator should you have any questions/concerns: Cardiac Imaging Nurse Navigators Direct Office Dial: [phone]   For scheduling needs, including cancellations and rescheduling, please call Brittany, [phone].   Referral to pharm-d  Follow-Up: At [facility], you and your health needs are our priority.  As part of our continuing mission to provide you with exceptional heart care, our providers are all part of one team.  This  team includes your primary Cardiologist (physician) and Advanced Practice Providers or APPs (Physician Assistants and Nurse Practitioners) who all work together to provide you with the care you need, when you need it.  Your next appointment:   3 month(s)  Provider:   Newman JINNY Lawrence, MD    We recommend signing up for the patient portal  called MyChart.  Sign up information is provided on this After Visit Summary.  MyChart is used to connect with patients for Virtual Visits (Telemedicine).  Patients are able to view lab/test results, encounter notes, upcoming appointments, etc.  Non-urgent messages can be sent to your provider as well.   To learn more about what you can do with MyChart, go to [email or web address].

## 2024-06-08 ENCOUNTER — Telehealth: Payer: Self-pay | Admitting: Cardiology

## 2024-06-08 LAB — BASIC METABOLIC PANEL WITH GFR
BUN/Creatinine Ratio: 25 (ref 12–28)
BUN: 13 mg/dL (ref 8–27)
CO2: 23 mmol/L (ref 20–29)
Calcium: 9.3 mg/dL (ref 8.7–10.3)
Chloride: 85 mmol/L — ABNORMAL LOW (ref 96–106)
Creatinine, Ser: 0.52 mg/dL — ABNORMAL LOW (ref 0.57–1.00)
Glucose: 85 mg/dL (ref 70–99)
Potassium: 4.7 mmol/L (ref 3.5–5.2)
Sodium: 121 mmol/L — ABNORMAL LOW (ref 134–144)
eGFR: 100 mL/min/1.73 (ref >59)

## 2024-06-08 NOTE — Telephone Encounter (Signed)
 What is the question? Please see my note from yesterday.  Thanks MJP

## 2024-06-08 NOTE — Progress Notes (Unsigned)
 Office Note     CC: Asymptomatic bilateral carotid artery stenosis, left greater than right. Requesting Provider:  Mahlon Comer BRAVO, MD  HPI: Brenda Woods is a 67 y.o. (09/13/1955) female presenting at the request of Dr. Jerri for asymptomatic bilateral carotid artery stenosis, left greater than right.  Brenda Woods was referred after recent hospitalization for what was believed to be axial overdose of carbamazepine .  Levels were elevated on admission.  Symptoms included ataxia, nausea vomiting, nystagmus.  TIA cannot be ruled out but was low on the differential.  She underwent CTA head and neck demonstrating critical stenosis of the left ICA, moderate stenosis of the right.    On exam today, Brenda Woods was doing well, accompanied by her sister.  A native of [place], she graduated from [employer].  During our discussion today regarding her hospitalization she denied weakness, but stated she could not walk due to dizziness.  She was otherwise 'out of it'.  Since the event, she denied symptoms of TIA, stroke, amaurosis.  Has some intermittent dizziness which resolves without issue.  History includes epilepsy-well-controlled, last seizure over a decade ago.  The pt is  on a statin for cholesterol management.  The pt is  on a daily aspirin .   Other AC:  plavix  The pt is  on medication for hypertension.   The pt is - diabetic.  Tobacco hx:  -  Past Medical History:  Diagnosis Date   Allergy    Arthritis    elbows   Carotid artery occlusion    Cataract    Osteopenia    Seizure (HCC)    last seizure 32, onset age 72   Seizure [facility])     Past Surgical History:  Procedure Laterality Date   ANKLE FRACTURE SURGERY  07-05-08    Social History   Socioeconomic History   Marital status: Married    Spouse name: Brenda Woods   Number of children: 0   Years of education: 12   Highest education level: Not on file  Occupational History    Employer: [employer]   Tobacco Use   Smoking status: Never   Smokeless tobacco: Never  Vaping Use   Vaping status: Never Used  Substance and Sexual Activity   Alcohol use: No   Drug use: Never   Sexual activity: Not Currently  Other Topics Concern   Not on file  Social History Narrative   Patient is married to Brenda Woods. 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 Patient has a high school education.   Patient is right-handed.   Patient drinks about 2 glasses of soda daily.   Social Drivers of Corporate Investment Banker Strain: Low Risk  (05/07/2023)   Overall Financial Resource Strain (CARDIA)    Difficulty of Paying Living Expenses: Not hard at all  Food Insecurity: No Food Insecurity (04/06/2024)   Hunger Vital Sign    Worried About Running Out of Food in the Last Year: Never true    Ran Out of Food in the Last Year: Never true  Transportation Needs: No Transportation Needs (04/06/2024)   PRAPARE - Administrator, Civil Service (Medical): No    Lack of Transportation (Non-Medical): No  Physical Activity: Sufficiently Active (05/07/2023)   Exercise Vital Sign    Days of Exercise per Week: 5 days    Minutes of Exercise per Session: 40 min  Stress: No Stress Concern  Present (05/07/2023)   [employer] of Occupational Health - Occupational Stress Questionnaire    Feeling of Stress : Not at all  Social Connections: Socially Isolated (04/06/2024)   Social Connection and Isolation Panel    Frequency of Communication with Friends and Family: Twice a week    Frequency of Social Gatherings with Friends and Family: Three times a week    Attends Religious Services: Never    Active Member of Clubs or Organizations: No    Attends Banker Meetings: Never    Marital Status: Widowed  Intimate Partner Violence: Not At Risk (05/07/2023)   Humiliation, Afraid, Rape, and Kick questionnaire    Fear of Current or Ex-Partner: No     Emotionally Abused: No    Physically Abused: No    Sexually Abused: No   Family History  Problem Relation Age of Onset   Alzheimer's disease Father    Breast cancer Maternal Aunt    Colon cancer Neg Hx    Esophageal cancer Neg Hx    Rectal cancer Neg Hx    Stomach cancer Neg Hx     Current Outpatient Medications  Medication Sig Dispense Refill   aspirin  EC 81 MG tablet Take 1 tablet (81 mg total) by mouth daily. Swallow whole. 90 tablet 0   atorvastatin  (LIPITOR) 40 MG tablet Take 1 tablet by mouth once daily 30 tablet 0   Calcium  Carb-Cholecalciferol 600-10 MG-MCG TABS Take 1 tablet twice a day by oral route.     carbamazepine  (TEGRETOL  XR) 100 MG 12 hr tablet Take 5 tablets (500 mg total) by mouth 2 (two) times daily. 900 tablet 3   clopidogrel  (PLAVIX ) 75 MG tablet Take 1 tablet (75 mg total) by mouth daily. 30 tablet 6   metoprolol  tartrate (LOPRESSOR ) 100 MG tablet Take 1 tablet (100 mg total) by mouth once for 1 dose. Take 90-120 minutes prior to scan. Hold for SBP less than 110. 1 tablet 0   Multiple Vitamins-Minerals (ONE-A-DAY 50 PLUS PO) Take 1 tablet by mouth daily. gummies     PHENobarbital  (LUMINAL) 64.8 MG tablet Take 2 tablets (129.6 mg total) by mouth at bedtime. 180 tablet 1   sodium chloride  1 g tablet Take 1 tablet by mouth once daily 90 tablet 0   zinc gluconate 50 MG tablet Take 50 mg by mouth daily.     No current facility-administered medications for this visit.    No Known Allergies   REVIEW OF SYSTEMS:  [X]  denotes positive finding, [ ]  denotes negative finding Cardiac  Comments:  Chest pain or chest pressure:    Shortness of breath upon exertion:    Short of breath when lying flat:    Irregular heart rhythm:        Vascular    Pain in calf, thigh, or hip brought on by ambulation:    Pain in feet at night that wakes you up from your sleep:     Blood clot in your veins:    Leg swelling:         Pulmonary    Oxygen at home:    Productive cough:      Wheezing:         Neurologic    Sudden weakness in arms or legs:     Sudden numbness in arms or legs:     Sudden onset of difficulty speaking or slurred speech:    Temporary loss of vision in one eye:     Problems with  dizziness:         Gastrointestinal    Blood in stool:     Vomited blood:         Genitourinary    Burning when urinating:     Blood in urine:        Psychiatric    Major depression:         Hematologic    Bleeding problems:    Problems with blood clotting too easily:        Skin    Rashes or ulcers:        Constitutional    Fever or chills:      PHYSICAL EXAMINATION:  There were no vitals filed for this visit.   General:  WDWN in NAD; vital signs documented above Gait: Not observed HENT: WNL, normocephalic Pulmonary: normal non-labored breathing , without wheezing Cardiac: regular HR Abdomen: soft, NT, no masses Skin: without rashes Vascular Exam/Pulses:  Right Left  Radial 2+ (normal) 2+ (normal)  Ulnar    Femoral    Popliteal    DP    PT     Extremities: without ischemic changes, without Gangrene , without cellulitis; without open wounds;  Musculoskeletal: no muscle wasting or atrophy  Neurologic: A&O X 3;  No focal weakness or paresthesias are detected Psychiatric:  The pt has Normal affect.   Non-Invasive Vascular Imaging:   Ultrasound demonstrated no significant stenosis.  I question the validity due to CTA findings demonstrating high-grade stenosis.    ASSESSMENT/PLAN: Sheritha Louis is a 68 y.o. female presenting with asymptomatic bilateral carotid artery stenosis, left greater than right.  While there is some discrepancy between the ultrasound and CT angio, the CT angio demonstrates a 90% stenosis.  I had a long discussion with Brenda Woods regarding the above, most notably that with 90% stenosis of her left ICA, she has a 11% risk of stroke over the next 5 years with best medical management.  With surgery, this lowers to 5%.  In  evaluating her imaging, I think that she is an excellent candidate for carotid endarterectomy.  I think she is a poor candidate for TCAR.  After discussing risks and benefits of both, she elected to proceed with carotid endarterectomy.  I would like for her to be seen by my colleague, Dr. Jeffrie, cardiology for preoperative risk assessment.  Last echo was performed while she was hospitalized in early October demonstrating normal ejection fraction and normal left ventricular function.  My plan is to call her prior to the procedure to answer any and all questions that arise in the interim.  I asked that she continue aspirin , Plavix , high intensity statin.  Fonda FORBES Rim, MD [facility] [phone]

## 2024-06-08 NOTE — Telephone Encounter (Signed)
 Called patient back about message. Patient seems confused about everything that happened yesterday. Patient does not seem to remember making appointments or going over cardiac CT. Patient needs an AVS printed and mailed to her. Will see if Dr. Gilda nurse can print AVS and mail it to patient.

## 2024-06-08 NOTE — Telephone Encounter (Signed)
 Spoke with patient's sister (on [place]) and she advised that patient is experiencing confusion. Sister reports concern for dementia, so she will touch base with Karysa and AVS will still be mailed.

## 2024-06-08 NOTE — Telephone Encounter (Signed)
 Pt called to request a c/b from Dr Elmira nurse to discuss visit on 06/07/24 please advise

## 2024-06-09 ENCOUNTER — Other Ambulatory Visit (HOSPITAL_COMMUNITY): Payer: Self-pay

## 2024-06-14 ENCOUNTER — Other Ambulatory Visit (HOSPITAL_COMMUNITY): Payer: Self-pay

## 2024-06-15 ENCOUNTER — Other Ambulatory Visit: Payer: Self-pay

## 2024-06-15 ENCOUNTER — Emergency Department (HOSPITAL_COMMUNITY)

## 2024-06-15 ENCOUNTER — Emergency Department (HOSPITAL_COMMUNITY)
Admission: EM | Admit: 2024-06-15 | Discharge: 2024-06-15 | Disposition: A | Discharge status: Home / Self Care | Attending: Emergency Medicine | Admitting: Emergency Medicine

## 2024-06-15 ENCOUNTER — Encounter (HOSPITAL_COMMUNITY): Payer: Self-pay | Admitting: Emergency Medicine

## 2024-06-15 DIAGNOSIS — R42 Dizziness and giddiness: Secondary | ICD-10-CM | POA: Diagnosis not present

## 2024-06-15 DIAGNOSIS — I700 Atherosclerosis of aorta: Secondary | ICD-10-CM | POA: Diagnosis not present

## 2024-06-15 DIAGNOSIS — Z7982 Long term (current) use of aspirin: Secondary | ICD-10-CM | POA: Insufficient documentation

## 2024-06-15 DIAGNOSIS — S0083XA Contusion of other part of head, initial encounter: Secondary | ICD-10-CM | POA: Diagnosis not present

## 2024-06-15 DIAGNOSIS — R531 Weakness: Secondary | ICD-10-CM | POA: Diagnosis not present

## 2024-06-15 DIAGNOSIS — Z79899 Other long term (current) drug therapy: Secondary | ICD-10-CM | POA: Insufficient documentation

## 2024-06-15 DIAGNOSIS — S0993XA Unspecified injury of face, initial encounter: Secondary | ICD-10-CM | POA: Diagnosis not present

## 2024-06-15 DIAGNOSIS — W19XXXA Unspecified fall, initial encounter: Secondary | ICD-10-CM | POA: Diagnosis not present

## 2024-06-15 DIAGNOSIS — R55 Syncope and collapse: Secondary | ICD-10-CM | POA: Diagnosis not present

## 2024-06-15 DIAGNOSIS — I10 Essential (primary) hypertension: Secondary | ICD-10-CM | POA: Diagnosis not present

## 2024-06-15 DIAGNOSIS — S199XXA Unspecified injury of neck, initial encounter: Secondary | ICD-10-CM | POA: Diagnosis not present

## 2024-06-15 DIAGNOSIS — S299XXA Unspecified injury of thorax, initial encounter: Secondary | ICD-10-CM | POA: Diagnosis not present

## 2024-06-15 DIAGNOSIS — Z7902 Long term (current) use of antithrombotics/antiplatelets: Secondary | ICD-10-CM | POA: Insufficient documentation

## 2024-06-15 DIAGNOSIS — S3993XA Unspecified injury of pelvis, initial encounter: Secondary | ICD-10-CM | POA: Diagnosis not present

## 2024-06-15 DIAGNOSIS — R609 Edema, unspecified: Secondary | ICD-10-CM | POA: Diagnosis not present

## 2024-06-15 LAB — CBC
HCT: 34.4 % — ABNORMAL LOW (ref 36.0–46.0)
Hemoglobin: 11.8 g/dL — ABNORMAL LOW (ref 12.0–15.0)
MCH: 33.2 pg (ref 26.0–34.0)
MCHC: 34.3 g/dL (ref 30.0–36.0)
MCV: 96.9 fL (ref 80.0–100.0)
Platelets: 256 K/uL (ref 150–400)
RBC: 3.55 MIL/uL — ABNORMAL LOW (ref 3.87–5.11)
RDW: 12.2 % (ref 11.5–15.5)
WBC: 4.1 K/uL (ref 4.0–10.5)
nRBC: 0 % (ref 0.0–0.2)

## 2024-06-15 LAB — COMPREHENSIVE METABOLIC PANEL WITH GFR
ALT: 25 U/L (ref 0–44)
AST: 34 U/L (ref 15–41)
Albumin: 3.5 g/dL (ref 3.5–5.0)
Alkaline Phosphatase: 103 U/L (ref 38–126)
Anion gap: 10 (ref 5–15)
BUN: 9 mg/dL (ref 8–23)
CO2: 23 mmol/L (ref 22–32)
Calcium: 8.7 mg/dL — ABNORMAL LOW (ref 8.9–10.3)
Chloride: 88 mmol/L — ABNORMAL LOW (ref 98–111)
Creatinine, Ser: 0.52 mg/dL (ref 0.44–1.00)
GFR, Estimated: >60 mL/min (ref >60)
Glucose, Bld: 101 mg/dL — ABNORMAL HIGH (ref 70–99)
Potassium: 4.8 mmol/L (ref 3.5–5.1)
Sodium: 121 mmol/L — ABNORMAL LOW (ref 135–145)
Total Bilirubin: 0.3 mg/dL (ref 0.0–1.2)
Total Protein: 6.6 g/dL (ref 6.5–8.1)

## 2024-06-15 LAB — I-STAT CG4 LACTIC ACID, ED: Lactic Acid, Venous: 0.6 mmol/L (ref 0.5–1.9)

## 2024-06-15 LAB — I-STAT CHEM 8, ED
BUN: 10 mg/dL (ref 8–23)
Calcium, Ion: 1.04 mmol/L — ABNORMAL LOW (ref 1.15–1.40)
Chloride: 88 mmol/L — ABNORMAL LOW (ref 98–111)
Creatinine, Ser: 0.5 mg/dL (ref 0.44–1.00)
Glucose, Bld: 99 mg/dL (ref 70–99)
HCT: 38 % (ref 36.0–46.0)
Hemoglobin: 12.9 g/dL (ref 12.0–15.0)
Potassium: 4.9 mmol/L (ref 3.5–5.1)
Sodium: 122 mmol/L — ABNORMAL LOW (ref 135–145)
TCO2: 24 mmol/L (ref 22–32)

## 2024-06-15 LAB — SAMPLE TO BLOOD BANK

## 2024-06-15 LAB — PROTIME-INR
INR: 1 (ref 0.8–1.2)
Prothrombin Time: 13.6 s (ref 11.4–15.2)

## 2024-06-15 NOTE — ED Provider Notes (Signed)
 [facility] EMERGENCY DEPARTMENT AT [facility] Provider Note   CSN: [id number] Arrival date & time: 06/15/24  1412     Patient presents with: Fall and Dizziness   Bedelia Pong is a 68 y.o. female.   HPI Patient presents as a level 2 trauma. Patient is on Plavix .  I evaluated the patient as she arrived to the emergency department, walk with her and EMS staff to her room, obtained history while transferring her to the gurney. In essence patient has multiple medical problems including ongoing evaluation for dizziness which has been present for months. Patient with likely carotid arterial disease, in preparation for CT coronaries for consideration of possible interventions. Today, she had an episode of worsening dizziness, fell, unclear loss of consciousness. Since that time she has had pain in the right side of her face, right upper chest.  EMS reports no hemodynamic instability.     Prior to Admission medications   Medication Sig Start Date End Date Taking? Authorizing Provider  aspirin  EC 81 MG tablet Take 1 tablet (81 mg total) by mouth daily. Swallow whole. 04/08/24 07/07/24  Perri DELENA Meliton Mickey., MD  atorvastatin  (LIPITOR) 40 MG tablet Take 1 tablet by mouth once daily 06/07/24   Tabori, Katherine E, MD  Calcium  Carb-Cholecalciferol 600-10 MG-MCG TABS Take 1 tablet twice a day by oral route. 05/02/20   [provider]  carbamazepine  (TEGRETOL  XR) 100 MG 12 hr tablet Take 5 tablets (500 mg total) by mouth 2 (two) times daily. 04/19/24   Lomax, Amy, NP  clopidogrel  (PLAVIX ) 75 MG tablet Take 1 tablet (75 mg total) by mouth daily. 04/28/24   Lanis Fonda BRAVO, MD  metoprolol  tartrate (LOPRESSOR ) 100 MG tablet Take 1 tablet (100 mg total) by mouth once for 1 dose. Take 90-120 minutes prior to scan. Hold for SBP less than 110. 06/07/24 06/08/24  Patwardhan, Manish J, MD  Multiple Vitamins-Minerals (ONE-A-DAY 50 PLUS PO) Take 1 tablet by mouth daily. gummies     [provider]  PHENobarbital  (LUMINAL) 64.8 MG tablet Take 2 tablets (129.6 mg total) by mouth at bedtime. 04/19/24   Lomax, Amy, NP  sodium chloride  1 g tablet Take 1 tablet by mouth once daily 06/06/24   Camara, Amadou, MD  zinc gluconate 50 MG tablet Take 50 mg by mouth daily.    [provider]    Allergies: Patient has no known allergies.    Review of Systems  Updated Vital Signs BP (!) 164/67   Pulse (!) 59   Temp (!) 97.3 F (36.3 C) (Oral)   Resp 10   Ht 1.626 m (5' 4)   Wt 48.5 kg   SpO2 100%   BMI 18.35 kg/m   Physical Exam Vitals and nursing note reviewed.  Constitutional:      General: She is not in acute distress.    Appearance: She is well-developed.     Comments: Frail-appearing elderly female awake and alert  HENT:     Head: Normocephalic.   Eyes:     Conjunctiva/sclera: Conjunctivae normal.  Cardiovascular:     Rate and Rhythm: Normal rate and regular rhythm.  Pulmonary:     Effort: Pulmonary effort is normal. No respiratory distress.     Breath sounds: No stridor.  Abdominal:     General: There is no distension.  Musculoskeletal:        General: No deformity.  Skin:    General: Skin is warm and dry.  Neurological:  Mental Status: She is alert and oriented to person, place, and time.     Cranial Nerves: No cranial nerve deficit.     Motor: Atrophy present.  Psychiatric:        Mood and Affect: Mood normal.     (all labs ordered are listed, but only abnormal results are displayed) Labs Reviewed  COMPREHENSIVE METABOLIC PANEL WITH GFR - Abnormal; Notable for the following components:      Result Value   Sodium 121 (*)    Chloride 88 (*)    Glucose, Bld 101 (*)    Calcium  8.7 (*)    All other components within normal limits  CBC - Abnormal; Notable for the following components:   RBC 3.55 (*)    Hemoglobin 11.8 (*)    HCT 34.4 (*)    All other components within normal limits  I-STAT CHEM 8, ED - Abnormal; Notable  for the following components:   Sodium 122 (*)    Chloride 88 (*)    Calcium , Ion 1.04 (*)    All other components within normal limits  PROTIME-INR  I-STAT CG4 LACTIC ACID, ED  SAMPLE TO BLOOD BANK      Radiology: CT CERVICAL SPINE WO CONTRAST Result Date: 06/15/2024 EXAM: CT CERVICAL SPINE WITHOUT CONTRAST 06/15/2024 02:37:00 PM TECHNIQUE: CT of the cervical spine was performed without the administration of intravenous contrast. Multiplanar reformatted images are provided for review. Automated exposure control, iterative reconstruction, and/or weight based adjustment of the mA/kV was utilized to reduce the radiation dose to as low as reasonably achievable. COMPARISON: None available. CLINICAL HISTORY: Polytrauma, blunt. Fall. Dizziness. FINDINGS: CERVICAL SPINE: BONES AND ALIGNMENT: Cervical spine straightening. Grade 1 anterolisthesis of C3 on C4 and grade 1 retrolisthesis of C5 on C6. No acute fracture or suspicious lesion. DEGENERATIVE CHANGES: Moderately advanced disc degeneration from C4-C5 through C6-C7 with at least mild spinal stenosis and moderate neural foraminal stenosis at C5-C6 and C6-C7. Moderate multilevel facet arthrosis. SOFT TISSUES: No prevertebral soft tissue swelling. Prominent atherosclerotic calcification at the carotid bifurcations. IMPRESSION: 1. No acute cervical spine fracture. 2. Moderately advanced disc degeneration. Electronically signed by: Dasie Hamburg MD 06/15/2024 02:57 PM EST RP Workstation: [id number]   DG Pelvis Portable Result Date: 06/15/2024 EXAM: 1 or 2 VIEW(S) XRAY OF THE PELVIS 06/15/2024 02:28:00 PM COMPARISON: CT 03/04/2022. CLINICAL HISTORY: Trauma. FINDINGS: BONES AND JOINTS: No acute fracture. No malalignment. Chronic degenerative disc disease at L4-S1. SOFT TISSUES: The soft tissues are unremarkable. IMPRESSION: 1. No evidence of acute traumatic injury. 2. Chronic degenerative disc disease at L4-S1. Electronically signed by: Katheleen Faes MD  06/15/2024 02:46 PM EST RP Workstation: [id number]   DG Chest Port 1 View Result Date: 06/15/2024 EXAM: 1 VIEW(S) XRAY OF THE CHEST 06/15/2024 02:28:00 PM COMPARISON: 11/06/2013 CLINICAL HISTORY: Trauma FINDINGS: LUNGS AND PLEURA: No focal pulmonary opacity. No pleural effusion. No pneumothorax. HEART AND MEDIASTINUM: Normal cardiac size. Aortic atherosclerosis. BONES AND SOFT TISSUES: No acute osseous abnormality. IMPRESSION: 1. No acute cardiopulmonary process. Electronically signed by: Dayne Hassell MD 06/15/2024 02:45 PM EST RP Workstation: [id number]     Procedures   Medications Ordered in the ED - No data to display                                  Medical Decision Making Elderly female, on antiplatelet medication with ongoing evaluation for dizziness presents after episode of dizziness resulting in fall. Patient with  facial hematoma, chest wall hematoma, but awake, alert, interactive throughout transport according to EMS and on arrival. Given the use of blood thinning agent, patient with concern for head trauma, intracranial abnormality, as well as rib injury, pulmonary contusion. Pulse ox 99% room air normal cardiac 60 sinus normal  Amount and/or Complexity of Data Reviewed Independent Historian: EMS Labs: ordered. Decision-making details documented in ED Course. Radiology: ordered and independent interpretation performed. Decision-making details documented in ED Course.  Risk Decision regarding hospitalization. Diagnosis or treatment significantly limited by social determinants of health.   3:16 PM Initial x-ray chest, pelvis, both reassuring.  Patient awake and alert. She is unaccompanied by her sister, with whom we discussed results thus far, presentation, patient's ongoing evaluation for dizziness.  She is scheduled to follow-up with vascular surgery in the coming weeks, cardiology, as well.  Patient's initial labs notable for hyponatremia, on chart review she has had  hyponatremia going back at least the past 3 months including during recent admission, and discharge with similar values. No evidence for acute hyponatremia, though she does have dizziness, she has no other evidence for altered mental status, seizure activity. Patient aware of this abnormality.  Update:, Remaining CT imaging all unremarkable, patient remains in no distress, hemodynamically unremarkable.  We discussed findings, concern for ongoing dizziness, need for close outpatient follow-up as previously scheduled as well as consideration of salt tablets, fluid restriction.  Sister, patient, I discussed utility of endocrine follow-up as well. Patient discharged in stable condition.      Final diagnoses:  Near syncope  Fall, initial encounter    ED Discharge Orders     None          Garrick Charleston, MD 06/15/24 1547

## 2024-06-15 NOTE — Discharge Instructions (Signed)
As discussed, your evaluation today has been largely reassuring.  But, it is important that you monitor your condition carefully, and do not hesitate to return to the ED if you develop new, or concerning changes in your condition. 

## 2024-06-15 NOTE — Progress Notes (Signed)
 Orthopedic Tech Progress Note Patient Details:  Brenda Woods 24-Sep-1955 [id number]  Patient ID: Brenda Woods, female   DOB: 1955/08/17, 68 y.o.   MRN: [id number] Responded to Level 2 Trauma Ortho Tech not needed. Thersia FALCON Kadian Barcellos 06/15/2024, 2:33 PM

## 2024-06-15 NOTE — ED Triage Notes (Signed)
 Pt arrives via EMS from home with dizziness for a few months due to clots in her neck. Dizziness worse today. Pt fell face first on concrete. Pt on plavix . Endorses right sided chest pain. Bruising to face.

## 2024-06-16 ENCOUNTER — Ambulatory Visit: Attending: Vascular Surgery | Admitting: Vascular Surgery

## 2024-06-16 ENCOUNTER — Telehealth (HOSPITAL_BASED_OUTPATIENT_CLINIC_OR_DEPARTMENT_OTHER): Payer: Self-pay

## 2024-06-16 ENCOUNTER — Other Ambulatory Visit (HOSPITAL_COMMUNITY): Payer: Self-pay

## 2024-06-16 NOTE — Telephone Encounter (Signed)
° °  Pre-operative Risk Assessment    Patient Name: Brenda Woods  DOB: 20-Mar-1956 MRN: [id number]   Date of last office visit: 06/07/24 with Dr. Elmira Date of next office visit: 09/06/24 with Dr. Elmira  Request for Surgical Clearance    Procedure:  CEA  Date of Surgery:  Clearance TBD  ASAP                               Surgeon:  Dr. Lanis Socks Group or Practice Name:  [facility]  Phone number:  [phone] Fax number:  [phone]   Type of Clearance Requested:   - Medical  - Pharmacy:  Hold Aspirin  and Clopidogrel  (Plavix ) not indicated   Type of Anesthesia:  General    Additional requests/questions:    Bonney Augustin JONETTA Delores   06/16/2024, 1:39 PM

## 2024-06-17 ENCOUNTER — Other Ambulatory Visit (HOSPITAL_COMMUNITY): Payer: Self-pay

## 2024-06-20 ENCOUNTER — Ambulatory Visit: Payer: Self-pay

## 2024-06-20 NOTE — Telephone Encounter (Signed)
 Please see original NT encounter for update. This RN will leave this encounter open, due to updated note from office.

## 2024-06-20 NOTE — Telephone Encounter (Signed)
FYI patient was advised to go to ED.

## 2024-06-20 NOTE — Telephone Encounter (Signed)
 Called patients sister (okay per DPR). Left vm to return call   Copied from CRM #[id number]. Topic: Clinical - Medical Advice >> Jun 20, 2024  3:57 PM Dedra B wrote: Reason for CRM: Pt's sister, Edna, would ike Dr. Mahlon to call her regarding pt.

## 2024-06-20 NOTE — Telephone Encounter (Signed)
°  FYI Only or Action Required?: FYI only for provider: ED advised.  Patient was last seen in primary care on 04/11/2024 by Mahlon Comer BRAVO, MD.  Called Nurse Triage reporting Dizziness.  Symptoms began several days ago.  Interventions attempted: Nothing.  Symptoms are: unchanged.  Triage Disposition: Go to ED Now (Notify PCP)  Patient/caregiver understands and will follow disposition?: No, refuses disposition  Copied from CRM #[id number]. Topic: Clinical - Red Word Triage >> Jun 20, 2024  1:21 PM Shereese L wrote: [facility] that prompted transfer to Nurse Triage: Dizzy spells for the last 3 days Reason for Disposition  Loss of vision or double vision  (Exception: Similar to previous migraines.)  Answer Assessment - Initial Assessment Questions 1. DESCRIPTION: Describe your dizziness.     Dizzy all the time  3. VERTIGO: Do you feel like either you or the room is spinning or tilting? (i.e., vertigo)     denies 4. SEVERITY: How bad is it?  Do you feel like you are going to faint? Can you stand and walk?     Concerned that she may fall 5. ONSET:  When did the dizziness begin?    Three days ago 6. AGGRAVATING FACTORS: Does anything make it worse? (e.g., standing, change in head position)     Not able to identify anything that worsens or improves dizziness 7. HEART RATE: Can you tell me your heart rate? How many beats in 15 seconds?  (Note: Not all patients can do this.)       Not measured 8. CAUSE: What do you think is causing the dizziness? (e.g., decreased fluids or food, diarrhea, emotional distress, heat exposure, new medicine, sudden standing, vomiting; unknown)     unknown 9. RECURRENT SYMPTOM: Have you had dizziness before? If Yes, ask: When was the last time? What happened that time?     denies 10. OTHER SYMPTOMS: Do you have any other symptoms? (e.g., fever, chest pain, vomiting, diarrhea, bleeding)       Difficulty focusing vision  Protocols  used: Dizziness - Lightheadedness-A-AH

## 2024-06-20 NOTE — Telephone Encounter (Signed)
 Copied from CRM #[id number]. Topic: Clinical - Red Word Triage >> Jun 20, 2024  1:21 PM Shereese L wrote: [facility] that prompted transfer to Nurse Triage: Dizzy spells for the last 3 days >> Jun 20, 2024  3:57 PM Dedra B wrote: Pt's sister calling to speak with nurse regarding advice pt was given earlier. Warm triage to NT

## 2024-06-20 NOTE — Telephone Encounter (Signed)
 Agree w/ advice given

## 2024-06-20 NOTE — Telephone Encounter (Signed)
 Patient's sister, JoAnne ([place]) called back in. Patient was advised to go to ED for symptoms earlier today. Sister is calling to report that patient does not want to go back to ED for symptoms. This RN advised that PCP was agreeable with ED disposition, per chart. This RN reemphasized that patient should still go to ED for symptoms. Sister is requesting a follow-up appointment in the office. Sister stated that previous ED provider advised that patient should be prescribed a medication to bring her sodium level down, which is what she is seeking at this time. Please advise. Sister is requesting a call back.   >> Jun 20, 2024  3:57 PM Dedra B wrote: Pt's sister calling to speak with nurse regarding advice pt was given earlier. Warm triage to NT

## 2024-06-21 ENCOUNTER — Ambulatory Visit (HOSPITAL_COMMUNITY)
Admission: RE | Admit: 2024-06-21 | Discharge: 2024-06-21 | Disposition: A | Discharge status: Home / Self Care | Source: Ambulatory Visit | Attending: Cardiology | Admitting: Cardiology

## 2024-06-21 ENCOUNTER — Ambulatory Visit (HOSPITAL_COMMUNITY): Admission: RE | Admit: 2024-06-21 | Source: Ambulatory Visit

## 2024-06-21 ENCOUNTER — Other Ambulatory Visit (HOSPITAL_COMMUNITY): Payer: Self-pay

## 2024-06-21 DIAGNOSIS — E7841 Elevated Lipoprotein(a): Secondary | ICD-10-CM | POA: Insufficient documentation

## 2024-06-21 DIAGNOSIS — I6523 Occlusion and stenosis of bilateral carotid arteries: Secondary | ICD-10-CM | POA: Diagnosis not present

## 2024-06-21 DIAGNOSIS — I2510 Atherosclerotic heart disease of native coronary artery without angina pectoris: Secondary | ICD-10-CM | POA: Diagnosis not present

## 2024-06-21 DIAGNOSIS — Z01818 Encounter for other preprocedural examination: Secondary | ICD-10-CM | POA: Insufficient documentation

## 2024-06-21 DIAGNOSIS — E782 Mixed hyperlipidemia: Secondary | ICD-10-CM | POA: Diagnosis not present

## 2024-06-21 DIAGNOSIS — Z01810 Encounter for preprocedural cardiovascular examination: Secondary | ICD-10-CM | POA: Insufficient documentation

## 2024-06-21 MED ORDER — NITROGLYCERIN 0.4 MG SL SUBL
0.8000 mg | SUBLINGUAL_TABLET | Freq: Once | SUBLINGUAL | Status: AC
  Administered 2024-06-21: 13:00:00 0.8 mg via SUBLINGUAL

## 2024-06-21 MED ORDER — IOHEXOL 350 MG/ML SOLN
100.0000 mL | Freq: Once | INTRAVENOUS | Status: AC | PRN
  Administered 2024-06-21: 13:00:00 100 mL via INTRAVENOUS

## 2024-06-21 NOTE — Telephone Encounter (Signed)
 I called and spoke to sister this morning. She did not take patient to hospital last night. She said they can't take her every 3 days to the hospital for a sodium drip. I did schedule a hospital follow up. Patients sister said that they gave her some gatorade to hopefully help her symptoms. Patients sister wants something done about her sister sx's and not bring her to the hospital.

## 2024-06-21 NOTE — Telephone Encounter (Signed)
 Please see other note

## 2024-06-22 ENCOUNTER — Other Ambulatory Visit: Payer: Self-pay | Admitting: Family Medicine

## 2024-06-22 ENCOUNTER — Other Ambulatory Visit: Payer: Self-pay | Admitting: Cardiology

## 2024-06-22 ENCOUNTER — Other Ambulatory Visit (HOSPITAL_COMMUNITY): Payer: Self-pay

## 2024-06-22 DIAGNOSIS — E782 Mixed hyperlipidemia: Secondary | ICD-10-CM

## 2024-06-22 DIAGNOSIS — E7841 Elevated Lipoprotein(a): Secondary | ICD-10-CM

## 2024-06-22 DIAGNOSIS — I6523 Occlusion and stenosis of bilateral carotid arteries: Secondary | ICD-10-CM

## 2024-06-22 DIAGNOSIS — Z01818 Encounter for other preprocedural examination: Secondary | ICD-10-CM

## 2024-06-22 NOTE — Telephone Encounter (Signed)
° °  Patient Name: Brenda Woods  DOB: 03/11/1956 MRN: [id number]  Primary Cardiologist: Newman JINNY Lawrence, MD  Chart reviewed as part of pre-operative protocol coverage. Patient was already seen 06/07/24 for pre-op evaluation at which time cardiac CTA was ordered. Interpretation/recommendations are pending from provider at this time.  Per office protocol, Dr. Lawrence should forward their finalized clearance decision to requesting party below upon completion of testing. I will route this message to MD so they are aware of where to fax final recommendation to. Will also need recommendation of holding ASA/Plavix . Will remove this message from the pre-op box as separate preop APP input is not needed.  Simrit Gohlke N Tia Hieronymus, PA-C 06/22/2024, 4:26 PM

## 2024-06-22 NOTE — Telephone Encounter (Signed)
 Severe calcification, but nonobstructive coronary artery disease.  Currently on aspirin , Plavix , Crestor , pending lipid clinic referral for injectable agents.  Okay to proceed with upcoming carotid surgery with moderate perioperative cardiac risk.  Okay to hold aspirin  and Plavix  for up to 5-7 days, further recommendations per vascular services  Thanks MJP

## 2024-06-22 NOTE — Telephone Encounter (Signed)
° °  Patient Name: Brenda Woods  DOB: 13-Sep-1955 MRN: [id number]  Primary Cardiologist: Newman JINNY Lawrence, MD  Chart reviewed as part of pre-operative protocol coverage. Dr. Lawrence reviewed chart given recent testing. Per his reply, Severe calcification, but nonobstructive coronary artery disease.  Currently on aspirin , Plavix , Crestor , pending lipid clinic referral for injectable agents.  Okay to proceed with upcoming carotid surgery with moderate perioperative cardiac risk.  Okay to hold aspirin  and Plavix  for up to 5-7 days, further recommendations per vascular services.  Will route this bundled recommendation to requesting provider via Epic fax function. Please call with questions.  Kemonte Ullman N Celvin Taney, PA-C 06/22/2024, 5:03 PM

## 2024-06-23 ENCOUNTER — Encounter: Payer: Self-pay | Admitting: Family Medicine

## 2024-06-23 ENCOUNTER — Other Ambulatory Visit (HOSPITAL_COMMUNITY): Payer: Self-pay

## 2024-06-23 ENCOUNTER — Ambulatory Visit: Admitting: Family Medicine

## 2024-06-23 VITALS — BP 150/62 | HR 75 | Temp 98.6°F | Ht 64.0 in | Wt 110.8 lb

## 2024-06-23 DIAGNOSIS — R296 Repeated falls: Secondary | ICD-10-CM | POA: Diagnosis not present

## 2024-06-23 DIAGNOSIS — E871 Hypo-osmolality and hyponatremia: Secondary | ICD-10-CM | POA: Diagnosis not present

## 2024-06-23 LAB — CBC WITH DIFFERENTIAL/PLATELET
Basophils Absolute: 0 K/uL (ref 0.0–0.1)
Basophils Relative: 0.3 % (ref 0.0–3.0)
Eosinophils Absolute: 0 K/uL (ref 0.0–0.7)
Eosinophils Relative: 0 % (ref 0.0–5.0)
HCT: 33.8 % — ABNORMAL LOW (ref 36.0–46.0)
Hemoglobin: 11.6 g/dL — ABNORMAL LOW (ref 12.0–15.0)
Lymphocytes Relative: 30.5 % (ref 12.0–46.0)
Lymphs Abs: 1.2 K/uL (ref 0.7–4.0)
MCHC: 34.2 g/dL (ref 30.0–36.0)
MCV: 97.5 fl (ref 78.0–100.0)
Monocytes Absolute: 0.4 K/uL (ref 0.1–1.0)
Monocytes Relative: 11 % (ref 3.0–12.0)
Neutro Abs: 2.3 K/uL (ref 1.4–7.7)
Neutrophils Relative %: 58.2 % (ref 43.0–77.0)
Platelets: 265 K/uL (ref 150.0–400.0)
RBC: 3.46 Mil/uL — ABNORMAL LOW (ref 3.87–5.11)
RDW: 12.6 % (ref 11.5–15.5)
WBC: 4 K/uL (ref 4.0–10.5)

## 2024-06-23 LAB — TSH: TSH: 1.8 u[IU]/mL (ref 0.35–5.50)

## 2024-06-23 LAB — BASIC METABOLIC PANEL WITH GFR
BUN: 9 mg/dL (ref 6–23)
CO2: 26 meq/L (ref 19–32)
Calcium: 8.6 mg/dL (ref 8.4–10.5)
Chloride: 89 meq/L — ABNORMAL LOW (ref 96–112)
Creatinine, Ser: 0.42 mg/dL (ref 0.40–1.20)
GFR: 98.88 mL/min (ref >60.00)
Glucose, Bld: 92 mg/dL (ref 70–99)
Potassium: 4.6 meq/L (ref 3.5–5.1)
Sodium: 123 meq/L — ABNORMAL LOW (ref 135–145)

## 2024-06-23 LAB — HEPATIC FUNCTION PANEL
ALT: 25 U/L (ref 3–35)
AST: 37 U/L (ref 5–37)
Albumin: 4 g/dL (ref 3.5–5.2)
Alkaline Phosphatase: 114 U/L (ref 39–117)
Bilirubin, Direct: 0.1 mg/dL (ref 0.1–0.3)
Total Bilirubin: 0.4 mg/dL (ref 0.2–1.2)
Total Protein: 6.9 g/dL (ref 6.0–8.3)

## 2024-06-23 NOTE — Progress Notes (Unsigned)
° °  Subjective:    Patient ID: Brenda Woods, female    DOB: 01-Oct-1955, 68 y.o.   MRN: [id number]  HPI ER f/u- pt went to the ER on 12/10 after falling due to worsening dizziness.  In ER, sodium wsa 121.  She had a facial hematoma and a chest wall hematoma.  CT imaging was unremarkable and bc she was not in distress and hemodynamically stable, she was dc'd home.  Pt reports continued dizziness which seems to worsen as day goes on.  Dizziness is worse w/ movement- particularly turning head.  Sister reports that dizziness improves w/ sodium and fluids.  Pt is currently taking 1g NaCl tabs daily.  Sister reports pt feels much better if drinking Gatorade, V8, taking salt tabs.   Review of Systems For ROS see HPI     Objective:   Physical Exam        Assessment & Plan:

## 2024-06-23 NOTE — Patient Instructions (Addendum)
 Follow up in 4-6 weeks to recheck blood pressure We'll notify you of your lab results and make any changes if needed We'll call you to schedule your Endocrinology appt for the sodium evaluation Make sure you are drinking between 32-40ish ounces of liquid each day CONTINUE the gatorade and V8 in addition to the salt tabs Call with any questions or concerns Stay Safe!  Stay Healthy! Happy Holidays!!!

## 2024-06-23 NOTE — Telephone Encounter (Signed)
 I understand not wanting to go to the ER but if she is having concerning sxs, that is where they will need to go to have them evaluated.  I have not seen pt since early October so I cannot comment on what her labs currently look like or what interventions need to occur.  At the very least, we need to schedule an office visit to get updated labs and discuss plan/next steps

## 2024-06-24 ENCOUNTER — Other Ambulatory Visit (HOSPITAL_COMMUNITY): Payer: Self-pay

## 2024-06-24 ENCOUNTER — Ambulatory Visit: Payer: Self-pay | Admitting: Family Medicine

## 2024-06-24 ENCOUNTER — Other Ambulatory Visit: Payer: Self-pay | Admitting: Family Medicine

## 2024-06-26 DIAGNOSIS — R296 Repeated falls: Secondary | ICD-10-CM | POA: Insufficient documentation

## 2024-06-26 NOTE — Assessment & Plan Note (Signed)
 Deteriorated.  Na was 121 in the ER.  Previously the thought was the low sodium was drom her Carbamazepine  but she continues to struggle w/ low sodium and symptoms consistent w/ hyponatremia- dizziness, falls, confusion.  Sister reports sxs improve w/ increased gatoarade, V8 juice and salt tabs.  Will check Na levels and decide whether she needs 1 or 2 salt tabs daily.  Will also refer to Endocrinology for complete hyponatremia workup.  Pt expressed understanding and is in agreement w/ plan.

## 2024-06-26 NOTE — Assessment & Plan Note (Signed)
 Ongoing issue.  Suspect this is due to ongoing hyponatremia but will check labs to assess for other underlying causes.  May need neuro rehab due to ongoing dizziness.

## 2024-06-27 ENCOUNTER — Telehealth: Payer: Self-pay | Admitting: Family Medicine

## 2024-06-27 NOTE — Telephone Encounter (Signed)
 Copied from CRM #[id number]. Topic: Referral - Request for Referral >> Jun 27, 2024  4:07 PM Ashley R wrote: Did the patient discuss referral with their provider in the last year? Yes  Appointment offered? Yes - already seen and referral was sent to Dr. Trixie, but they are no longer treating patients with sodium issues. Need a new referral sent to a new provider who can.   Type of order/referral and detailed reason for visit: Endocrinology  Preference of office, provider, location: [facility], in [facility] network  If referral order, have you been seen by this specialty before? No (If Yes, this issue or another issue? When? Where?  Can we respond through MyChart? No

## 2024-06-28 ENCOUNTER — Other Ambulatory Visit (HOSPITAL_COMMUNITY): Payer: Self-pay

## 2024-06-28 NOTE — Telephone Encounter (Signed)
 Referral placed for endocrinology Dr.Gherghe is no longer treating patients with sodium issues. Patient is needing referral sent to a different provider/location that will treat sodium issues.

## 2024-06-28 NOTE — Telephone Encounter (Signed)
 Ok to refer to other outside Endo for hyponatremia

## 2024-06-28 NOTE — Telephone Encounter (Signed)
 Patient has been referred to:  [facility] @ [facility] -Dr. Odella Jacobson [address]  [place] [address]  Phone: [phone]  Fax: [phone]  M - F, 7:45am - 5:00pm

## 2024-06-28 NOTE — Telephone Encounter (Signed)
 Patients sister Joann (okay to speak to her per [facility]). I gave her [facility] information, she will call them next week if she does not hear from them

## 2024-06-29 ENCOUNTER — Other Ambulatory Visit (HOSPITAL_COMMUNITY): Payer: Self-pay

## 2024-07-01 ENCOUNTER — Other Ambulatory Visit: Payer: Self-pay | Admitting: Family Medicine

## 2024-07-12 ENCOUNTER — Ambulatory Visit (HOSPITAL_COMMUNITY)

## 2024-07-12 ENCOUNTER — Other Ambulatory Visit (HOSPITAL_COMMUNITY): Payer: Self-pay

## 2024-07-12 ENCOUNTER — Telehealth: Payer: Self-pay

## 2024-07-12 ENCOUNTER — Telehealth: Payer: Self-pay | Admitting: Pharmacy Technician

## 2024-07-12 ENCOUNTER — Ambulatory Visit: Attending: Cardiology

## 2024-07-12 DIAGNOSIS — E785 Hyperlipidemia, unspecified: Secondary | ICD-10-CM

## 2024-07-12 MED ORDER — REPATHA SURECLICK 140 MG/ML ~~LOC~~ SOAJ: 140.0000 mg | SUBCUTANEOUS | 2 refills | Status: AC

## 2024-07-12 NOTE — Telephone Encounter (Signed)
 Please see other encounter.

## 2024-07-12 NOTE — Progress Notes (Signed)
 " Office Note     CC: Asymptomatic bilateral carotid artery stenosis, left greater than right. Requesting Provider:  Mahlon Comer BRAVO, MD  HPI: Brenda Woods is a 69 y.o. (07-27-1955) female presenting in follow-up for discussion for left-sided carotid endarterectomy  Brenda Woods was initially referred after recent hospitalization for what was believed to be axial overdose of carbamazepine .  Levels were elevated on admission.  Symptoms included ataxia, nausea vomiting, nystagmus.  TIA could not be ruled out but was low on the differential.  She underwent CTA head and neck demonstrating critical stenosis of the left ICA, moderate stenosis of the right.    At the time of her last visit, we discussed carotid endarterectomy pending coronary CT to categorize her cardiac risk.  Brenda Woods had difficulty following through with the coronary CT, and there was some confusion regarding its purpose.  Furthermore, when I called to discuss the importance of it with Brenda Woods, she did not remember our conversation, and was very confused about the need for surgery.  I did not feel comfortable moving forward with surgery regardless of the results until I saw her again in the office due to the significant amount of confusion associated.  Coronary artery calcium  score 745 each ear placing the patient in the 95th percentile for age and gender suggesting high risk for future cardiac events.  Extensive but nonobstructive coronary disease confirmed by CT FFR.  On exam today, Brenda Woods was doing well, accompanied by her friend.  Mental status has improved dramatically with treatment of hyponatremia.  Denies any symptoms of TIA, stroke, amaurosis.  A native of [place], she graduated from [employer].   History includes epilepsy-well-controlled, last seizure over a decade ago.  The pt is  on a statin for cholesterol management.  The pt is  on a daily aspirin .   Other AC:  plavix  The pt is  on medication for  hypertension.   The pt is - diabetic.  Tobacco hx:  -  Past Medical History:  Diagnosis Date   Allergy    Arthritis    elbows   Carotid artery occlusion    Cataract    Osteopenia    Seizure (HCC)    last seizure 72, onset age 69   Seizure [facility])     Past Surgical History:  Procedure Laterality Date   ANKLE FRACTURE SURGERY  07-05-08    Social History   Socioeconomic History   Marital status: Married    Spouse name: Brenda Woods   Number of children: 0   Years of education: 12   Highest education level: Not on file  Occupational History    Employer: [employer]  Tobacco Use   Smoking status: Never   Smokeless tobacco: Never  Vaping Use   Vaping status: Never Used  Substance and Sexual Activity   Alcohol use: No   Drug use: Never   Sexual activity: Not Currently  Other Topics Concern   Not on file  Social History Narrative   Patient is married to Brenda Woods. 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 Patient has a high school education.   Patient is right-handed.   Patient drinks about 2 glasses of soda daily.   Social Drivers of Health   Tobacco Use: Low Risk (07/12/2024)   Patient History    Smoking Tobacco Use: Never    Smokeless Tobacco Use: Never    Passive Exposure: Not on file  Financial Resource Strain: Low Risk (05/07/2023)   Overall Financial Resource Strain (CARDIA)    Difficulty of Paying Living Expenses: Not hard at all  Food Insecurity: No Food Insecurity (04/06/2024)   Epic    Worried About Programme Researcher, Broadcasting/film/video in the Last Year: Never true    Ran Out of Food in the Last Year: Never true  Transportation Needs: No Transportation Needs (04/06/2024)   Epic    Lack of Transportation (Medical): No    Lack of Transportation (Non-Medical): No  Physical Activity: Sufficiently Active (05/07/2023)   Exercise Vital Sign    Days of Exercise per Week: 5 days    Minutes of Exercise per  Session: 40 min  Stress: No Stress Concern Present (05/07/2023)   [employer] of Occupational Health - Occupational Stress Questionnaire    Feeling of Stress : Not at all  Social Connections: Socially Isolated (04/06/2024)   Social Connection and Isolation Panel    Frequency of Communication with Friends and Family: Twice a week    Frequency of Social Gatherings with Friends and Family: Three times a week    Attends Religious Services: Never    Active Member of Clubs or Organizations: No    Attends Banker Meetings: Never    Marital Status: Widowed  Intimate Partner Violence: Not At Risk (05/07/2023)   Humiliation, Afraid, Rape, and Kick questionnaire    Fear of Current or Ex-Partner: No    Emotionally Abused: No    Physically Abused: No    Sexually Abused: No  Depression (PHQ2-9): Low Risk (06/23/2024)   Depression (PHQ2-9)    PHQ-2 Score: 0  Alcohol Screen: Low Risk (05/07/2023)   Alcohol Screen    Last Alcohol Screening Score (AUDIT): 0  Housing: Low Risk (04/06/2024)   Epic    Unable to Pay for Housing in the Last Year: No    Number of Times Moved in the Last Year: 0    Homeless in the Last Year: No  Utilities: Not At Risk (04/06/2024)   Epic    Threatened with loss of utilities: No  Health Literacy: Adequate Health Literacy (05/07/2023)   B1300 Health Literacy    Frequency of need for help with medical instructions: Never   Family History  Problem Relation Age of Onset   Alzheimer's disease Father    Breast cancer Maternal Aunt    Colon cancer Neg Hx    Esophageal cancer Neg Hx    Rectal cancer Neg Hx    Stomach cancer Neg Hx     Current Outpatient Medications  Medication Sig Dispense Refill   aspirin  EC 81 MG tablet Take 81 mg by mouth daily. Swallow whole.     atorvastatin  (LIPITOR) 40 MG tablet Take 1 tablet by mouth once daily 30 tablet 0   Calcium  Carb-Cholecalciferol 600-10 MG-MCG TABS Take 1 tablet twice a day by oral route.      carbamazepine  (TEGRETOL  XR) 100 MG 12 hr tablet Take 5 tablets (500 mg total) by mouth 2 (two) times daily. 900 tablet 3   clopidogrel  (PLAVIX ) 75 MG tablet Take 1 tablet (75 mg total) by mouth daily. 30 tablet 6   Multiple Vitamins-Minerals (ONE-A-DAY 50 PLUS PO) Take 1 tablet by mouth daily. gummies     PHENobarbital  (LUMINAL) 64.8 MG tablet Take 2 tablets (129.6 mg total) by mouth at bedtime. 180 tablet 1   sodium chloride  1 g tablet Take 1 tablet by mouth once daily (Patient taking differently: Take 2 g  by mouth daily.) 90 tablet 0   zinc gluconate 50 MG tablet Take 50 mg by mouth daily.     No current facility-administered medications for this visit.    No Known Allergies   REVIEW OF SYSTEMS:  [X]  denotes positive finding, [ ]  denotes negative finding Cardiac  Comments:  Chest pain or chest pressure:    Shortness of breath upon exertion:    Short of breath when lying flat:    Irregular heart rhythm:        Vascular    Pain in calf, thigh, or hip brought on by ambulation:    Pain in feet at night that wakes you up from your sleep:     Blood clot in your veins:    Leg swelling:         Pulmonary    Oxygen at home:    Productive cough:     Wheezing:         Neurologic    Sudden weakness in arms or legs:     Sudden numbness in arms or legs:     Sudden onset of difficulty speaking or slurred speech:    Temporary loss of vision in one eye:     Problems with dizziness:         Gastrointestinal    Blood in stool:     Vomited blood:         Genitourinary    Burning when urinating:     Blood in urine:        Psychiatric    Major depression:         Hematologic    Bleeding problems:    Problems with blood clotting too easily:        Skin    Rashes or ulcers:        Constitutional    Fever or chills:      PHYSICAL EXAMINATION:  There were no vitals filed for this visit.   General:  WDWN in NAD; vital signs documented above Gait: Not observed HENT: WNL,  normocephalic Pulmonary: normal non-labored breathing , without wheezing Cardiac: regular HR Abdomen: soft, NT, no masses Skin: without rashes Vascular Exam/Pulses:  Right Left  Radial 2+ (normal) 2+ (normal)  Ulnar    Femoral    Popliteal    DP    PT     Extremities: without ischemic changes, without Gangrene , without cellulitis; without open wounds;  Musculoskeletal: no muscle wasting or atrophy  Neurologic: A&O X 3;  No focal weakness or paresthesias are detected Psychiatric:  The pt has Normal affect.   Non-Invasive Vascular Imaging:   Ultrasound demonstrated no significant stenosis.  I question the validity due to CTA findings demonstrating high-grade stenosis.    ASSESSMENT/PLAN: Brenda Woods is a 69 y.o. female presenting with asymptomatic bilateral carotid artery stenosis, left greater than right.  While there is some discrepancy between the ultrasound and CT angio, the CT angio demonstrates a 90% stenosis.  I had a long discussion with Sanaiya regarding the above, most notably that with 90% stenosis of her left ICA, she has a 11% risk of stroke over the next 5 years with best medical management.  With surgery, this lowers to 5%.  In evaluating her imaging, I think that she is an excellent candidate for carotid endarterectomy.  I think she is a poor candidate for TCAR.  After discussing risks and benefits of both, she elected to proceed with carotid endarterectomy.  I asked  that she continue aspirin , Plavix , high intensity statin.  Fonda FORBES Rim, MD [facility] [phone]  "

## 2024-07-12 NOTE — Telephone Encounter (Signed)
 PA approval already on file until 02/09/25   Pharmacy Patient Advocate Encounter   Insurance verification completed.   The patient is insured through [employer] test claim for repatha . Currently a quantity of 2 ml is a 28 day supply and the co-pay is $541.90- deductible . Patient may be eligible for a Medicare prescription Payment plan. The patient will need to reach out to their insurance company to enrol in the payment plan to spread out their payments throughout the year, If available.     This test claim was processed through [facility]- copay amounts may vary at other pharmacies due to pharmacy/plan contracts, or as the patient moves through the different stages of their insurance plan.

## 2024-07-12 NOTE — Assessment & Plan Note (Signed)
 Assessment:  LDL goal: < 70 mg/dl; last LDLc 891  mg/dl (89/7974); Lpa (01/2024): 571 nmol/L; Liver enzymes wnl Tolerates atorvastatin  well without any side effects  Discussed next potential options (PCSK-9 inhibitors, bempedoic acid and inclisiran); cost, dosing efficacy, side effects  After an in-depth discussion of PCSK9 inhibitors, including administration technique and benefits, the patient is willing to proceed with PCSK9 therapy Encouraged adherence to a heart-healthy diet and initiation of structured physical activity, such as walking daily or several times per week  Plan: Continue taking atorvastatin  40 mg daily Will apply for PA for PCSK9i; will inform patient upon approval  Lipid lab and Lpa due in 2-3 months after starting PCSK9i

## 2024-07-12 NOTE — Patient Instructions (Signed)
 Your Results:             Your most recent labs Goal  Total Cholesterol 200 < 200  Triglycerides 28 < 150  HDL (happy/good cholesterol) 86 > 40  LDL (lousy/bad cholesterol 108 < 70  Lpa  571 < 75   Medication changes: Continue atorvastatin  40 mg daily We will start the process to get Repatha /Praluent covered by your insurance.  Once the prior authorization is complete, we will call you to let you know and confirm pharmacy information.    Lab orders:We want to repeat labs 3 months after starting PCSK9i.  We will send you a lab order to remind you once we        get closer to that time.    Adah Stoneberg E. Carmel Waddington, Pharm.D, CPP [place] [facility]. [facility] [address], [place], [place] [address] Phone: [phone]; Fax: [phone]     Praluent is a cholesterol medication that improved your body's ability to get rid of bad cholesterol known as LDL. It can lower your LDL up to 60%. It is an injection that is given under the skin every 2 weeks. The most common side effects of Praluent include runny nose, symptoms of the common cold, rarely flu or flu-like symptoms, back/muscle pain in about 3-4% of the patients, and redness, pain, or bruising at the injection site.    Repatha  is a cholesterol medication that improved your body's ability to get rid of bad cholesterol known as LDL. It can lower your LDL up to 60%! It is an injection that is given under the skin every 2 weeks. The medication often requires a prior authorization from your insurance company. We will take care of submitting all the necessary information to your insurance company to get it approved. The most common side effects of Repatha  include runny nose, symptoms of the common cold, rarely flu or flu-like symptoms, back/muscle pain in about 3-4% of the patients, and redness, pain, or bruising at the injection site.    It is also recommended that patients with high cholesterol adhere to a  heart healthy diet, get regular exercise, avoid use of tobacco products, and maintain a healthy weight. Steps that you can take to help in these areas:  Limit consumption of trans fats, saturated fats, and cholesterol in your diet  Increase intake of lean meats such as chicken, turkey, and fish  Increase intake of foods rich in fiber such as fresh fruits, vegetables, beans and oatmeal Exercise as you are able; even 30 minutes of walking daily can aid in increasing heart health      Copay Assistance:  The [employer] foundation offers assistance to help pay for medication copays.  They will cover copays for all cholesterol lowering meds, including statins, fibrates, omega-3 oils, ezetimibe, Repatha , Praluent, Nexletol, Nexlizet.  The cards are usually good for $2,500 or 12 months, whichever comes first. Go to healthwellfoundation.org Click on Apply Now Answer questions as to whom is applying (patient or representative) Your disease fund will be hypercholesterolemia - Medicare access Select the cholesterol medication you need assistance with (Repatha , Praluent, Nexlizet...) They will ask question about qualifying diagnosis - you can mark yes; and do you have insurance coverage.   When they ask what type of assistance you are interested in - copay assistance When you submit, the approval is usually within minutes.  You will need to print the card information from the site You will need  to show this information to your pharmacy, they will bill your Medicare Part D plan first -then bill [employer] --for the copay.   You can also call them at [phone], although the hold times can be quite long.

## 2024-07-12 NOTE — Telephone Encounter (Signed)
 Patient Advocate Encounter   The patient was approved for a [employer] grant that will help cover the cost of Repatha  Total amount awarded, 2500.00.  Effective: 06/12/24 - 06/11/25   APW:[id number] ERW:[id number] Hmnle:[id number] PI:[id number]  [employer] ID: [id number]   Pharmacy provided with approval and processing information. Added in [facility]

## 2024-07-12 NOTE — Addendum Note (Signed)
 Addended by: Arnisha Laffoon E on: 07/12/2024 03:08 PM   Modules accepted: Orders

## 2024-07-12 NOTE — Telephone Encounter (Signed)
 Called patient and discuss grant approval. Provided grant info for pharmacy to process rx. Repatha  rx sent.

## 2024-07-12 NOTE — Progress Notes (Signed)
 Patient ID: Brenda Woods                 DOB: 06-Jul-1956                    MRN: [id number]      HPI: Brenda Woods is a 69 y.o. female patient referred to lipid clinic by Dr. Elmira. PMH is significant for CAD (significant carotid artery stenosis with 90% narrowing on the left and 60% on the right), HLD, HTN, and chronic hyponatremia.   The patient was last evaluated by Dr. Elmira in December 2025, at which time LDL remained above the goal of <70 mg/dL, and she was referred to the PharmD for consideration of PCSK9 inhibitor therapy.  Of note, Repatha  was recommended in August 2025, but the patient expressed hesitation due to safety concerns. She was reassured that it is safe from a cardiology standpoint and beneficial, but she was not interested at that time.  Today, the patient presents in good spirits, accompanied by her sister. She is currently taking atorvastatin  40 mg daily for hyperlipidemia and tolerating it well without adverse effects. She reports a chronically decreased appetite. She has a history of hyponatremia and is currently drinking one bottle of water and one Gatorade daily, along with increasing to two sodium tablets daily. Her sister notes improved energy and speech since increasing to two sodium tablets daily as directed by provider.   Additionally, the patient states she is scheduled to meet with the surgeon on Thursday to discuss carotid endarterectomy and plan a procedure date.  Reviewed options for lowering LDL cholesterol, including ezetimibe, PCSK-9 inhibitors, bempedoic acid and inclisiran.  Discussed mechanisms of action, dosing, side effects and potential decreases in LDL cholesterol.  Also reviewed cost information and potential options for patient assistance.   Current Medications: atorvastatin  40 mg daily Intolerances: none Risk Factors: age, severe carotid artery stenosis, HTN, elevated Lpa LDL goal: < 70 Lipid panel (04/2024): Chol 200, Trig 28,  HDL 86, LDL 108 Lpa (01/2024): 571 nmol/L Liver enzymes (06/2024): AST 37, ALT 25, Alk phos 114  Diet:  Breakfast: cheerios Lunch/Dinner: turkey or ham sandwich or soup for lunch; baked chicken and potatoes for dinner Snacks: potato chips Beverages: 1 -16 oz water and 1 Gatorade   Exercise: no structured exercise; house work or walk if weather permits.  Family History:  Relation Problem Comments  Mother (Alive)   Father (Deceased at age 31) Alzheimer's disease     Sister (Alive)   Maternal Aunt Breast cancer     Neg Hx Colon cancer   Esophageal cancer   Rectal cancer   Stomach cancer      Social History:  Alcohol: none Smoking: none   Labs:  Lipid Panel     Component Value Date/Time   CHOL 200 04/07/2024 0350   TRIG 28 04/07/2024 0350   HDL 86 04/07/2024 0350   CHOLHDL 2.3 04/07/2024 0350   VLDL 6 04/07/2024 0350   LDLCALC 108 (H) 04/07/2024 0350    Past Medical History:  Diagnosis Date   Allergy    Arthritis    elbows   Carotid artery occlusion    Cataract    Osteopenia    Seizure (HCC)    last seizure 1991, onset age 49   Seizure (HCC)     Medications Ordered Prior to Encounter[1]  Allergies[2]  Assessment/Plan:  1. Hyperlipidemia -  Problem  Hyperlipidemia   Hyperlipidemia Assessment:  LDL goal: < 70 mg/dl;  last LDLc 108  mg/dl (89/7974); Lpa (01/2024): 571 nmol/L; Liver enzymes wnl Tolerates atorvastatin  well without any side effects  Discussed next potential options (PCSK-9 inhibitors, bempedoic acid and inclisiran); cost, dosing efficacy, side effects  After an in-depth discussion of PCSK9 inhibitors, including administration technique and benefits, the patient is willing to proceed with PCSK9 therapy Encouraged adherence to a heart-healthy diet and initiation of structured physical activity, such as walking daily or several times per week  Plan: Continue taking atorvastatin  40 mg daily Will apply for PA for PCSK9i; will inform patient  upon approval  Lipid lab and Lpa due in 2-3 months after starting PCSK9i    Thank you,  Kamauri Kathol E. Stephene Alegria, Pharm.D, CPP [place] [facility]. [facility] [address], [place], [place] [address] Phone: [phone]; Fax: [phone]        [1]  Current Outpatient Medications on File Prior to Visit  Medication Sig Dispense Refill   aspirin  EC 81 MG tablet Take 81 mg by mouth daily. Swallow whole.     atorvastatin  (LIPITOR) 40 MG tablet Take 1 tablet by mouth once daily 30 tablet 0   Calcium  Carb-Cholecalciferol 600-10 MG-MCG TABS Take 1 tablet twice a day by oral route.     carbamazepine  (TEGRETOL  XR) 100 MG 12 hr tablet Take 5 tablets (500 mg total) by mouth 2 (two) times daily. 900 tablet 3   clopidogrel  (PLAVIX ) 75 MG tablet Take 1 tablet (75 mg total) by mouth daily. 30 tablet 6   Multiple Vitamins-Minerals (ONE-A-DAY 50 PLUS PO) Take 1 tablet by mouth daily. gummies     PHENobarbital  (LUMINAL) 64.8 MG tablet Take 2 tablets (129.6 mg total) by mouth at bedtime. 180 tablet 1   sodium chloride  1 g tablet Take 1 tablet by mouth once daily (Patient taking differently: Take 2 g by mouth daily.) 90 tablet 0   zinc gluconate 50 MG tablet Take 50 mg by mouth daily.     No current facility-administered medications on file prior to visit.  [2] No Known Allergies

## 2024-07-14 ENCOUNTER — Encounter: Payer: Self-pay | Admitting: Vascular Surgery

## 2024-07-14 ENCOUNTER — Other Ambulatory Visit: Payer: Self-pay

## 2024-07-14 ENCOUNTER — Ambulatory Visit: Attending: Vascular Surgery | Admitting: Vascular Surgery

## 2024-07-14 VITALS — BP 164/73 | HR 70 | Temp 97.7°F | Resp 20 | Ht 64.0 in | Wt 111.6 lb

## 2024-07-14 DIAGNOSIS — I6523 Occlusion and stenosis of bilateral carotid arteries: Secondary | ICD-10-CM | POA: Diagnosis not present

## 2024-07-16 ENCOUNTER — Other Ambulatory Visit (HOSPITAL_COMMUNITY): Payer: Self-pay

## 2024-07-21 ENCOUNTER — Other Ambulatory Visit (HOSPITAL_COMMUNITY): Payer: Self-pay

## 2024-08-03 NOTE — Progress Notes (Signed)
 Surgical Instructions   Your procedure is scheduled on Monday, February 2nd, 2026. Report to [facility] Main Entrance A at 7:45 A.M., then check in with the Admitting office. Any questions or running late day of surgery: call [phone]  Questions prior to your surgery date: call [phone], Monday-Friday, 8am-4pm. If you experience any cold or flu symptoms such as cough, fever, chills, shortness of breath, etc. between now and your scheduled surgery, please notify us  at the above number.     Remember:  Do not eat or drink after midnight the night before your surgery     Take these medicines the morning of surgery with A SIP OF WATER: Aspirin  Atorvastatin  (Lipitor) Carbamazepine  (Tegretol ) Clopidogrel  (Plavix )    May take these medicines IF NEEDED: None.     One week prior to surgery, STOP taking any Aleve, Naproxen, Ibuprofen, Motrin, Advil, Goody's, BC's, all herbal medications, fish oil, and non-prescription vitamins.                     Do NOT Smoke (Tobacco/Vaping) for 24 hours prior to your procedure.  If you use a CPAP at night, you may bring your mask/headgear for your overnight stay.   You will be asked to remove any contacts, glasses, piercing's, hearing aid's, dentures/partials prior to surgery. Please bring cases for these items if needed.    Your surgeon will determine if you are to be admitted or discharged the same day.  Patients discharged the day of surgery will not be allowed to drive home, and someone needs to stay with them for 24 hours.  SURGICAL WAITING ROOM VISITATION Patients may have no more than 2 support people in the waiting area - these visitors may rotate.   Pre-op nurse will coordinate an appropriate time for 2 ADULT support persons, who may not rotate, to accompany patient in pre-op.  Children under the age of 30 must have an adult with them who is not the patient and must remain in the main waiting area with an adult.  If the patient  needs to stay at the hospital during part of their recovery, the visitor guidelines for inpatient rooms apply.  Please refer to the [facility] website for the visitor guidelines for any additional information.   If you received a COVID test during your pre-op visit  it is requested that you wear a mask when out in public, stay away from anyone that may not be feeling well and notify your surgeon if you develop symptoms. If you have been in contact with anyone that has tested positive in the last 10 days please notify you surgeon.      Pre-operative CHG Bathing Instructions   You can play a key role in reducing the risk of infection after surgery. Your skin needs to be as free of germs as possible. You can reduce the number of germs on your skin by washing with CHG (chlorhexidine gluconate) soap before surgery. CHG is an antiseptic soap that kills germs and continues to kill germs even after washing.   DO NOT use if you have an allergy to chlorhexidine/CHG or antibacterial soaps. If your skin becomes reddened or irritated, stop using the CHG and notify one of our RNs at [phone].              TAKE A SHOWER THE NIGHT BEFORE SURGERY   Please keep in mind the following:  DO NOT shave, including legs and underarms, 48 hours prior to surgery.  You may shave your face before/day of surgery.  Place clean sheets on your bed the night before surgery Use a clean washcloth (not used since being washed) for shower. DO NOT sleep with pet's night before surgery.  CHG Shower Instructions:  Wash your face and private area with normal soap. If you choose to wash your hair, wash first with your normal shampoo.  After you use shampoo/soap, rinse your hair and body thoroughly to remove shampoo/soap residue.  Turn the water OFF and apply half the bottle of CHG soap to a CLEAN washcloth.  Apply CHG soap ONLY FROM YOUR NECK DOWN TO YOUR TOES (washing for 3-5 minutes)  DO NOT use CHG soap on face, private  areas, open wounds, or sores.  Pay special attention to the area where your surgery is being performed.  If you are having back surgery, having someone wash your back for you may be helpful. Wait 2 minutes after CHG soap is applied, then you may rinse off the CHG soap.  Pat dry with a clean towel  Put on clean pajamas    Additional instructions for the day of surgery: If you choose, you may shower the morning of surgery with an antibacterial soap.  DO NOT APPLY any lotions, deodorants, cologne, or perfumes.   Do not wear jewelry or makeup Do not wear nail polish, gel polish, artificial nails, or any other type of covering on natural nails (fingers and toes) Do not bring valuables to the hospital. [facility] is not responsible for valuables/personal belongings. Put on clean/comfortable clothes.  Please brush your teeth.  Ask your nurse before applying any prescription medications to the skin.

## 2024-08-04 ENCOUNTER — Encounter (HOSPITAL_COMMUNITY): Payer: Self-pay

## 2024-08-04 ENCOUNTER — Encounter (HOSPITAL_COMMUNITY)
Admission: RE | Admit: 2024-08-04 | Discharge: 2024-08-04 | Disposition: A | Discharge status: Home / Self Care | Source: Ambulatory Visit | Attending: Vascular Surgery | Admitting: Vascular Surgery

## 2024-08-04 ENCOUNTER — Other Ambulatory Visit: Payer: Self-pay

## 2024-08-04 VITALS — BP 170/66 | HR 65 | Temp 98.1°F | Resp 18 | Ht 63.0 in | Wt 111.5 lb

## 2024-08-04 DIAGNOSIS — I6523 Occlusion and stenosis of bilateral carotid arteries: Secondary | ICD-10-CM

## 2024-08-04 DIAGNOSIS — Z01818 Encounter for other preprocedural examination: Secondary | ICD-10-CM | POA: Diagnosis present

## 2024-08-04 HISTORY — DX: Myoneural disorder, unspecified: G70.9

## 2024-08-04 LAB — CBC
HCT: 33.6 % — ABNORMAL LOW (ref 36.0–46.0)
Hemoglobin: 11.9 g/dL — ABNORMAL LOW (ref 12.0–15.0)
MCH: 33.1 pg (ref 26.0–34.0)
MCHC: 35.4 g/dL (ref 30.0–36.0)
MCV: 93.3 fL (ref 80.0–100.0)
Platelets: 276 10*3/uL (ref 150–400)
RBC: 3.6 MIL/uL — ABNORMAL LOW (ref 3.87–5.11)
RDW: 12.8 % (ref 11.5–15.5)
WBC: 3.6 10*3/uL — ABNORMAL LOW (ref 4.0–10.5)
nRBC: 0 % (ref 0.0–0.2)

## 2024-08-04 LAB — URINALYSIS, ROUTINE W REFLEX MICROSCOPIC
Bilirubin Urine: NEGATIVE
Glucose, UA: NEGATIVE mg/dL
Hgb urine dipstick: NEGATIVE
Ketones, ur: NEGATIVE mg/dL
Leukocytes,Ua: NEGATIVE
Nitrite: NEGATIVE
Protein, ur: NEGATIVE mg/dL
Specific Gravity, Urine: 1.008 (ref 1.005–1.030)
pH: 7 (ref 5.0–8.0)

## 2024-08-04 LAB — COMPREHENSIVE METABOLIC PANEL WITH GFR
ALT: 28 U/L (ref 0–44)
AST: 39 U/L (ref 15–41)
Albumin: 4.3 g/dL (ref 3.5–5.0)
Alkaline Phosphatase: 153 U/L — ABNORMAL HIGH (ref 38–126)
Anion gap: 10 (ref 5–15)
BUN: 7 mg/dL — ABNORMAL LOW (ref 8–23)
CO2: 26 mmol/L (ref 22–32)
Calcium: 8.8 mg/dL — ABNORMAL LOW (ref 8.9–10.3)
Chloride: 89 mmol/L — ABNORMAL LOW (ref 98–111)
Creatinine, Ser: 0.4 mg/dL — ABNORMAL LOW (ref 0.44–1.00)
GFR, Estimated: >60 mL/min
Glucose, Bld: 96 mg/dL (ref 70–99)
Potassium: 3.7 mmol/L (ref 3.5–5.1)
Sodium: 124 mmol/L — ABNORMAL LOW (ref 135–145)
Total Bilirubin: 0.3 mg/dL (ref 0.0–1.2)
Total Protein: 7.5 g/dL (ref 6.5–8.1)

## 2024-08-04 LAB — APTT: aPTT: 37 s — ABNORMAL HIGH (ref 24–36)

## 2024-08-04 LAB — SURGICAL PCR SCREEN
MRSA, PCR: NEGATIVE
Staphylococcus aureus: POSITIVE — AB

## 2024-08-04 LAB — TYPE AND SCREEN
ABO/RH(D): A POS
Antibody Screen: NEGATIVE

## 2024-08-04 LAB — PROTIME-INR
INR: 1 (ref 0.8–1.2)
Prothrombin Time: 13.5 s (ref 11.4–15.2)

## 2024-08-04 NOTE — Progress Notes (Signed)
 PCP - Comer Mahlon COME Cardiologist - Mahesh Patwardhan,MD  PPM/ICD - denies Device Orders -  Rep Notified -   Chest x-ray - 06/15/24 EKG - 06/07/24 Stress Test - denies ECHO - 04/07/14 Cardiac Cath -denies   Sleep Study - denies CPAP -   Fasting Blood Sugar - na Checks Blood Sugar _____ times a day  Last dose of GLP1 agonist-  na GLP1 instructions: na  Blood Thinner Instructions:per Dr. Lanis murray Aspirin  and Plavix  Aspirin  Instructions:see above  ERAS Protcol -NPO. PRE-SURGERY Ensure or G2-   COVID TEST- na   Anesthesia review: Sodium 124 at PAT; hx Severe CAD,HTN. Saw Dr. Elmira 06/07/24 for cardiac clearance. Messaged Alan Gretel PEAK in Dr. Lanis office with Na+ result.  Patient denies shortness of breath, fever, cough and chest pain at PAT appointment   All instructions explained to the patient, with a verbal understanding of the material. Patient agrees to go over the instructions while at home for a better understanding.  The opportunity to ask questions was provided.

## 2024-08-04 NOTE — Progress Notes (Signed)
 Notified Dr. Lanis via telephone that patient's Na+ was 124 at PAT appointment today. Patient is having surgery Monday.

## 2024-08-05 ENCOUNTER — Other Ambulatory Visit: Payer: Self-pay | Admitting: Family Medicine

## 2024-08-05 NOTE — Progress Notes (Signed)
 Anesthesia Chart Review:  69 year old female with pertinent history including HTN, HLD, chronic hyponatremia, seizures (remote, none recently), carotid artery disease.  Recently found to have significant carotid artery stenosis with 90% LICA and 60% RICA.  She was seen by vascular surgeon Dr. Lanis who recommended cardiac workup prior to undergoing vascular intervention.  She was seen by Dr. Elmira on 06/07/2024 who recommended coronary CTA for risk stratification.  CTA 06/21/2024 showed extensive but nonobstructive CAD confirmed by FFR.  Prior echo 04/2024 showed LVEF 65 to 70%, normal wall motion, normal RV, no significant valvular abnormalities.  Patient was cleared for surgery and telephone encounter 06/22/2024 by Raphael Bring, PA-C, Chart reviewed as part of pre-operative protocol coverage. Dr. Elmira reviewed chart given recent testing. Per his reply, Severe calcification, but nonobstructive coronary artery disease.  Currently on aspirin , Plavix , Crestor , pending lipid clinic referral for injectable agents.  Okay to proceed with upcoming carotid surgery with moderate perioperative cardiac risk.  Okay to hold aspirin  and Plavix  for up to 5-7 days, further recommendations per vascular services.   Patient has chronic hyponatremia which has been followed by her PCP.  Sodium persistently low despite supplementation. Felt possibly related to tegretol . She has been previously advised to restrict fluid intake. Sodium 124 on preop labs, which appears to be near her recent baseline.   Sodium  Date Value  08/04/2024 124 mmol/L (L)  06/23/2024 123 mEq/L (L)  06/15/2024 122 mmol/L (L)  06/15/2024 121 mmol/L (L)  06/07/2024 121 mmol/L (L)  04/19/2024 125 mmol/L (L)  04/11/2024 126 mEq/L (L)  04/20/2023 129 mmol/L (L)  04/11/2021 127 mmol/L (L)  08/08/2020 129 mmol/L (L)   Preop labs otherwise unremarkable.  EKG 06/07/24: NSR. Rate 66.  Coronary CTA 06/21/2024: IMPRESSION: 1. Coronary  artery calcium  score 745 AU. This places the patient in the 95th percentile for age and gender, suggesting high risk for future cardiac events.   2. Extensive but nonobstructive coronary disease. This is confirmed by CT FFR.  TTE 04/07/2024: 1. Left ventricular ejection fraction, by estimation, is 65 to 70%. Left  ventricular ejection fraction by 2D MOD biplane is 68.4 %. The left  ventricle has normal function. The left ventricle has no regional wall  motion abnormalities. Left ventricular  diastolic parameters were normal.   2. Right ventricular systolic function is normal. The right ventricular  size is normal. There is normal pulmonary artery systolic pressure. The  estimated right ventricular systolic pressure is 24.0 mmHg.   3. The mitral valve is normal in structure. No evidence of mitral valve  regurgitation.   4. The aortic valve is tricuspid. Aortic valve regurgitation is not  visualized. Aortic valve sclerosis/calcification is present, without any  evidence of aortic stenosis. Aortic valve mean gradient measures 4.0 mmHg.   5. The inferior vena cava is normal in size with <50% respiratory  variability, suggesting right atrial pressure of 8 mmHg.     Lynwood Geofm RIGGERS [facility] Short Stay Center/Anesthesiology Phone [phone] 08/05/2024 10:15 AM

## 2024-08-05 NOTE — Anesthesia Preprocedure Evaluation (Signed)
 "                                  Anesthesia Evaluation    Airway        Dental   Pulmonary           Cardiovascular      Neuro/Psych    GI/Hepatic   Endo/Other    Renal/GU      Musculoskeletal   Abdominal   Peds  Hematology   Anesthesia Other Findings   Reproductive/Obstetrics                              Anesthesia Physical Anesthesia Plan  ASA:   Anesthesia Plan:    Post-op Pain Management:    Induction:   PONV Risk Score and Plan:   Airway Management Planned:   Additional Equipment:   Intra-op Plan:   Post-operative Plan:   Informed Consent:   Plan Discussed with:   Anesthesia Plan Comments: (PAT note by Lynwood Hope, PA-C: 69 year old female with pertinent history including HTN, HLD, chronic hyponatremia, seizures (remote, none recently), carotid artery disease.  Recently found to have significant carotid artery stenosis with 90% LICA and 60% RICA.  She was seen by vascular surgeon Dr. Lanis who recommended cardiac workup prior to undergoing vascular intervention.  She was seen by Dr. Elmira on 06/07/2024 who recommended coronary CTA for risk stratification.  CTA 06/21/2024 showed extensive but nonobstructive CAD confirmed by FFR.  Prior echo 04/2024 showed LVEF 65 to 70%, normal wall motion, normal RV, no significant valvular abnormalities.  Patient was cleared for surgery and telephone encounter 06/22/2024 by Raphael Bring, PA-C, Chart reviewed as part of pre-operative protocol coverage. Dr. Elmira reviewed chart given recent testing. Per his reply, Severe calcification, but nonobstructive coronary artery disease.  Currently on aspirin , Plavix , Crestor , pending lipid clinic referral for injectable agents.  Okay to proceed with upcoming carotid surgery with moderate perioperative cardiac risk.  Okay to hold aspirin  and Plavix  for up to 5-7 days, further recommendations per vascular services.   Patient  has chronic hyponatremia which has been followed by her PCP.  Sodium persistently low despite supplementation. Felt possibly related to tegretol . She has been previously advised to restrict fluid intake. Sodium 124 on preop labs, which appears to be near her recent baseline.   Sodium Date Value 08/04/2024 124 mmol/L (L) 06/23/2024 123 mEq/L (L) 06/15/2024 122 mmol/L (L) 06/15/2024 121 mmol/L (L) 06/07/2024 121 mmol/L (L) 04/19/2024 125 mmol/L (L) 04/11/2024 126 mEq/L (L) 04/20/2023 129 mmol/L (L) 04/11/2021 127 mmol/L (L) 08/08/2020 129 mmol/L (L)  Preop labs otherwise unremarkable.  EKG 06/07/24: NSR. Rate 66.  Coronary CTA 06/21/2024: IMPRESSION: 1. Coronary artery calcium  score 745 AU. This places the patient in the 95th percentile for age and gender, suggesting high risk for future cardiac events.   2. Extensive but nonobstructive coronary disease. This is confirmed by CT FFR.  TTE 04/07/2024: 1. Left ventricular ejection fraction, by estimation, is 65 to 70%. Left  ventricular ejection fraction by 2D MOD biplane is 68.4 %. The left  ventricle has normal function. The left ventricle has no regional wall  motion abnormalities. Left ventricular  diastolic parameters were normal.   2. Right ventricular systolic function is normal. The right ventricular  size is normal. There is normal pulmonary artery systolic pressure. The  estimated right ventricular systolic pressure is  24.0 mmHg.   3. The mitral valve is normal in structure. No evidence of mitral valve  regurgitation.   4. The aortic valve is tricuspid. Aortic valve regurgitation is not  visualized. Aortic valve sclerosis/calcification is present, without any  evidence of aortic stenosis. Aortic valve mean gradient measures 4.0 mmHg.   5. The inferior vena cava is normal in size with <50% respiratory  variability, suggesting right atrial pressure of 8 mmHg.   )         Anesthesia Quick Evaluation  "

## 2024-08-07 NOTE — Progress Notes (Signed)
 Pt called to say she is cancelling her surgery for tomorrow. Pt was advised to call the surgeon's office.

## 2024-08-08 ENCOUNTER — Inpatient Hospital Stay (HOSPITAL_COMMUNITY): Admission: RE | Admit: 2024-08-08 | Admitting: Vascular Surgery

## 2024-08-08 ENCOUNTER — Encounter: Admission: RE | Payer: Self-pay | Source: Home / Self Care

## 2024-08-08 ENCOUNTER — Other Ambulatory Visit: Payer: Self-pay

## 2024-08-08 ENCOUNTER — Encounter (HOSPITAL_COMMUNITY): Payer: Self-pay | Admitting: Physician Assistant

## 2024-08-08 DIAGNOSIS — I6523 Occlusion and stenosis of bilateral carotid arteries: Secondary | ICD-10-CM

## 2024-08-08 NOTE — Progress Notes (Signed)
 Spoke to patients sister Joann, updated with new surgery day/time of 08/12/2024, 0530, NPO. No further questions at this time.

## 2024-08-12 ENCOUNTER — Encounter (HOSPITAL_COMMUNITY): Payer: Self-pay

## 2024-08-12 ENCOUNTER — Encounter (HOSPITAL_COMMUNITY): Payer: Self-pay | Admitting: Vascular Surgery

## 2024-08-12 ENCOUNTER — Other Ambulatory Visit: Payer: Self-pay

## 2024-08-12 ENCOUNTER — Encounter (HOSPITAL_COMMUNITY): Admission: RE | Payer: Self-pay | Source: Home / Self Care

## 2024-08-12 ENCOUNTER — Inpatient Hospital Stay (HOSPITAL_COMMUNITY): Admission: RE | Admit: 2024-08-12 | Source: Home / Self Care | Admitting: Vascular Surgery

## 2024-08-12 DIAGNOSIS — I6523 Occlusion and stenosis of bilateral carotid arteries: Principal | ICD-10-CM | POA: Diagnosis present

## 2024-08-12 DIAGNOSIS — I6522 Occlusion and stenosis of left carotid artery: Secondary | ICD-10-CM | POA: Diagnosis present

## 2024-08-12 LAB — COMPREHENSIVE METABOLIC PANEL WITH GFR
ALT: 25 U/L (ref 0–44)
AST: 34 U/L (ref 15–41)
Albumin: 4 g/dL (ref 3.5–5.0)
Alkaline Phosphatase: 154 U/L — ABNORMAL HIGH (ref 38–126)
Anion gap: 10 (ref 5–15)
BUN: 8 mg/dL (ref 8–23)
CO2: 25 mmol/L (ref 22–32)
Calcium: 8.8 mg/dL — ABNORMAL LOW (ref 8.9–10.3)
Chloride: 88 mmol/L — ABNORMAL LOW (ref 98–111)
Creatinine, Ser: 0.38 mg/dL — ABNORMAL LOW (ref 0.44–1.00)
GFR, Estimated: >60 mL/min
Glucose, Bld: 89 mg/dL (ref 70–99)
Potassium: 3.6 mmol/L (ref 3.5–5.1)
Sodium: 123 mmol/L — ABNORMAL LOW (ref 135–145)
Total Bilirubin: 0.3 mg/dL (ref 0.0–1.2)
Total Protein: 6.9 g/dL (ref 6.5–8.1)

## 2024-08-12 LAB — BASIC METABOLIC PANEL WITH GFR
Anion gap: 15 (ref 5–15)
BUN: 6 mg/dL — ABNORMAL LOW (ref 8–23)
CO2: 17 mmol/L — ABNORMAL LOW (ref 22–32)
Calcium: 8.4 mg/dL — ABNORMAL LOW (ref 8.9–10.3)
Chloride: 90 mmol/L — ABNORMAL LOW (ref 98–111)
Creatinine, Ser: 0.42 mg/dL — ABNORMAL LOW (ref 0.44–1.00)
GFR, Estimated: >60 mL/min
Glucose, Bld: 109 mg/dL — ABNORMAL HIGH (ref 70–99)
Potassium: 4.2 mmol/L (ref 3.5–5.1)
Sodium: 122 mmol/L — ABNORMAL LOW (ref 135–145)

## 2024-08-12 LAB — POCT ACTIVATED CLOTTING TIME
Activated Clotting Time: 220 s
Activated Clotting Time: 220 s
Activated Clotting Time: 240 s
Activated Clotting Time: 255 s
Activated Clotting Time: 158 s

## 2024-08-12 LAB — ABO/RH: ABO/RH(D): A POS

## 2024-08-12 MED ORDER — EPHEDRINE SULFATE-NACL 50-0.9 MG/10ML-% IV SOSY
PREFILLED_SYRINGE | INTRAVENOUS | Status: DC | PRN
  Administered 2024-08-12: 5 mg via INTRAVENOUS

## 2024-08-12 MED ORDER — 0.9 % SODIUM CHLORIDE (POUR BTL) OPTIME
TOPICAL | Status: DC | PRN
  Administered 2024-08-12: 2000 mL

## 2024-08-12 MED ORDER — ACETAMINOPHEN 650 MG RE SUPP: 325.0000 mg | RECTAL | Status: AC | PRN

## 2024-08-12 MED ORDER — GLYCOPYRROLATE PF 0.2 MG/ML IJ SOSY
PREFILLED_SYRINGE | INTRAMUSCULAR | Status: AC
  Filled 2024-08-12: qty 1

## 2024-08-12 MED ORDER — HEPARIN SODIUM (PORCINE) 1000 UNIT/ML IJ SOLN
INTRAMUSCULAR | Status: AC
  Filled 2024-08-12: qty 10

## 2024-08-12 MED ORDER — PROTAMINE SULFATE 10 MG/ML IV SOLN
INTRAVENOUS | Status: DC | PRN
  Administered 2024-08-12: 50 mg via INTRAVENOUS

## 2024-08-12 MED ORDER — CARBAMAZEPINE ER 200 MG PO TB12
500.0000 mg | ORAL_TABLET | Freq: Two times a day (BID) | ORAL | Status: AC
  Administered 2024-08-12 (×2): 500 mg via ORAL
  Filled 2024-08-12 (×2): qty 1

## 2024-08-12 MED ORDER — NEOSTIGMINE METHYLSULFATE 3 MG/3ML IV SOSY
PREFILLED_SYRINGE | INTRAVENOUS | Status: AC
  Filled 2024-08-12: qty 3

## 2024-08-12 MED ORDER — SODIUM CHLORIDE 0.9 % IV SOLN
0.1500 ug/kg/min | INTRAVENOUS | Status: AC
  Administered 2024-08-12: .1 ug/kg/min via INTRAVENOUS
  Filled 2024-08-12: qty 2000

## 2024-08-12 MED ORDER — CHLORHEXIDINE GLUCONATE CLOTH 2 % EX PADS: 6.0000 | MEDICATED_PAD | Freq: Once | CUTANEOUS | Status: DC

## 2024-08-12 MED ORDER — LIDOCAINE 2% (20 MG/ML) 5 ML SYRINGE
INTRAMUSCULAR | Status: AC
  Filled 2024-08-12: qty 5

## 2024-08-12 MED ORDER — OXYCODONE HCL 5 MG PO TABS
5.0000 mg | ORAL_TABLET | ORAL | Status: AC | PRN
  Administered 2024-08-12: 10 mg via ORAL
  Filled 2024-08-12: qty 2

## 2024-08-12 MED ORDER — LABETALOL HCL 5 MG/ML IV SOLN: 10.0000 mg | INTRAVENOUS | Status: AC | PRN

## 2024-08-12 MED ORDER — LIDOCAINE HCL (PF) 1 % IJ SOLN
INTRAMUSCULAR | Status: AC
  Filled 2024-08-12: qty 30

## 2024-08-12 MED ORDER — ACETAMINOPHEN 325 MG PO TABS: 325.0000 mg | ORAL_TABLET | ORAL | Status: AC | PRN

## 2024-08-12 MED ORDER — POLYETHYLENE GLYCOL 3350 17 G PO PACK: 17.0000 g | PACK | Freq: Every day | ORAL | Status: AC | PRN

## 2024-08-12 MED ORDER — DEXAMETHASONE SOD PHOSPHATE PF 10 MG/ML IJ SOLN
INTRAMUSCULAR | Status: DC | PRN
  Administered 2024-08-12: 10 mg via INTRAVENOUS

## 2024-08-12 MED ORDER — SODIUM CHLORIDE 0.9 % IV SOLN: 500.0000 mL | Freq: Once | INTRAVENOUS | Status: AC | PRN

## 2024-08-12 MED ORDER — ONDANSETRON HCL 4 MG/2ML IJ SOLN: 4.0000 mg | Freq: Once | INTRAMUSCULAR | Status: DC | PRN

## 2024-08-12 MED ORDER — HYDRALAZINE HCL 20 MG/ML IJ SOLN: 5.0000 mg | INTRAMUSCULAR | Status: AC | PRN

## 2024-08-12 MED ORDER — LABETALOL HCL 5 MG/ML IV SOLN
INTRAVENOUS | Status: AC
  Filled 2024-08-12: qty 4

## 2024-08-12 MED ORDER — AMISULPRIDE (ANTIEMETIC) 5 MG/2ML IV SOLN: 10.0000 mg | Freq: Once | INTRAVENOUS | Status: DC | PRN

## 2024-08-12 MED ORDER — OXYCODONE HCL 5 MG PO TABS: 5.0000 mg | ORAL_TABLET | Freq: Once | ORAL | Status: DC | PRN

## 2024-08-12 MED ORDER — PROPOFOL 10 MG/ML IV BOLUS
INTRAVENOUS | Status: AC
  Filled 2024-08-12: qty 20

## 2024-08-12 MED ORDER — CLOPIDOGREL BISULFATE 75 MG PO TABS: 75.0000 mg | ORAL_TABLET | Freq: Every day | ORAL | Status: AC

## 2024-08-12 MED ORDER — PHENYLEPHRINE HCL-NACL 20-0.9 MG/250ML-% IV SOLN
INTRAVENOUS | Status: AC
  Filled 2024-08-12: qty 250

## 2024-08-12 MED ORDER — HEMOSTATIC AGENTS (NO CHARGE) OPTIME
TOPICAL | Status: DC | PRN
  Administered 2024-08-12: 3 via TOPICAL

## 2024-08-12 MED ORDER — PROPOFOL 10 MG/ML IV BOLUS
INTRAVENOUS | Status: DC | PRN
  Administered 2024-08-12: 60 mg via INTRAVENOUS
  Administered 2024-08-12: 20 mg via INTRAVENOUS
  Administered 2024-08-12: 40 mg via INTRAVENOUS

## 2024-08-12 MED ORDER — STROKE: EARLY STAGES OF RECOVERY BOOK: Freq: Once | Status: AC

## 2024-08-12 MED ORDER — ONDANSETRON HCL 4 MG/2ML IJ SOLN: 4.0000 mg | Freq: Four times a day (QID) | INTRAMUSCULAR | Status: AC | PRN

## 2024-08-12 MED ORDER — FENTANYL CITRATE (PF) 100 MCG/2ML IJ SOLN: 25.0000 ug | INTRAMUSCULAR | Status: DC | PRN

## 2024-08-12 MED ORDER — FENTANYL CITRATE (PF) 100 MCG/2ML IJ SOLN
INTRAMUSCULAR | Status: DC | PRN
  Administered 2024-08-12: 50 ug via INTRAVENOUS

## 2024-08-12 MED ORDER — ROCURONIUM BROMIDE 10 MG/ML (PF) SYRINGE
PREFILLED_SYRINGE | INTRAVENOUS | Status: AC
  Filled 2024-08-12: qty 10

## 2024-08-12 MED ORDER — FENTANYL CITRATE (PF) 100 MCG/2ML IJ SOLN
INTRAMUSCULAR | Status: AC
  Filled 2024-08-12: qty 2

## 2024-08-12 MED ORDER — OXYCODONE HCL 5 MG/5ML PO SOLN: 5.0000 mg | Freq: Once | ORAL | Status: DC | PRN

## 2024-08-12 MED ORDER — HEPARIN 6000 UNIT IRRIGATION SOLUTION
Status: AC
  Filled 2024-08-12: qty 500

## 2024-08-12 MED ORDER — CEFAZOLIN SODIUM-DEXTROSE 2-4 GM/100ML-% IV SOLN
2.0000 g | Freq: Three times a day (TID) | INTRAVENOUS | Status: AC
  Administered 2024-08-12 (×2): 2 g via INTRAVENOUS
  Filled 2024-08-12 (×2): qty 100

## 2024-08-12 MED ORDER — PHENOBARBITAL 32.4 MG PO TABS
129.6000 mg | ORAL_TABLET | Freq: Every day | ORAL | Status: AC
  Administered 2024-08-12: 129.6 mg via ORAL
  Filled 2024-08-12: qty 4

## 2024-08-12 MED ORDER — POTASSIUM CHLORIDE CRYS ER 20 MEQ PO TBCR: 40.0000 meq | EXTENDED_RELEASE_TABLET | Freq: Every day | ORAL | Status: AC | PRN

## 2024-08-12 MED ORDER — HEPARIN 6000 UNIT IRRIGATION SOLUTION
Status: DC | PRN
  Administered 2024-08-12: 1

## 2024-08-12 MED ORDER — ACETAMINOPHEN 10 MG/ML IV SOLN
15.0000 mg/kg | Freq: Once | INTRAVENOUS | Status: AC
  Administered 2024-08-12: 728 mg via INTRAVENOUS

## 2024-08-12 MED ORDER — EPHEDRINE 5 MG/ML INJ
INTRAVENOUS | Status: AC
  Filled 2024-08-12: qty 5

## 2024-08-12 MED ORDER — GLYCOPYRROLATE PF 0.2 MG/ML IJ SOSY
PREFILLED_SYRINGE | INTRAMUSCULAR | Status: DC | PRN
  Administered 2024-08-12: .4 mg via INTRAVENOUS

## 2024-08-12 MED ORDER — LACTATED RINGERS IV SOLN: INTRAVENOUS | Status: DC | PRN

## 2024-08-12 MED ORDER — ATORVASTATIN CALCIUM 40 MG PO TABS
40.0000 mg | ORAL_TABLET | Freq: Every day | ORAL | Status: AC
  Administered 2024-08-12: 40 mg via ORAL
  Filled 2024-08-12: qty 1

## 2024-08-12 MED ORDER — SURGIFLO WITH THROMBIN (HEMOSTATIC MATRIX KIT) OPTIME
TOPICAL | Status: DC | PRN
  Administered 2024-08-12: 1 via TOPICAL

## 2024-08-12 MED ORDER — SODIUM CHLORIDE 0.9 % IV SOLN: INTRAVENOUS | Status: DC

## 2024-08-12 MED ORDER — ASPIRIN 81 MG PO TBEC: 81.0000 mg | DELAYED_RELEASE_TABLET | Freq: Every day | ORAL | Status: AC

## 2024-08-12 MED ORDER — CHLORHEXIDINE GLUCONATE 0.12 % MT SOLN
OROMUCOSAL | Status: AC
  Administered 2024-08-12: 15 mL
  Filled 2024-08-12: qty 15

## 2024-08-12 MED ORDER — CLEVIDIPINE BUTYRATE 0.5 MG/ML IV EMUL
INTRAVENOUS | Status: AC
  Filled 2024-08-12: qty 50

## 2024-08-12 MED ORDER — HEMOSTATIC AGENTS (NO CHARGE) OPTIME
TOPICAL | Status: DC | PRN
  Administered 2024-08-12: 1 via TOPICAL

## 2024-08-12 MED ORDER — MORPHINE SULFATE (PF) 2 MG/ML IV SOLN: 2.0000 mg | INTRAVENOUS | Status: AC | PRN

## 2024-08-12 MED ORDER — ROCURONIUM BROMIDE 10 MG/ML (PF) SYRINGE
PREFILLED_SYRINGE | INTRAVENOUS | Status: DC | PRN
  Administered 2024-08-12: 20 mg via INTRAVENOUS
  Administered 2024-08-12: 50 mg via INTRAVENOUS
  Administered 2024-08-12: 30 mg via INTRAVENOUS

## 2024-08-12 MED ORDER — SODIUM CHLORIDE 0.9 % IV SOLN: INTRAVENOUS | Status: AC

## 2024-08-12 MED ORDER — HEPARIN SODIUM (PORCINE) 1000 UNIT/ML IJ SOLN
INTRAMUSCULAR | Status: DC | PRN
  Administered 2024-08-12 (×3): 2000 [IU] via INTRAVENOUS
  Administered 2024-08-12: 5000 [IU] via INTRAVENOUS

## 2024-08-12 MED ORDER — METOPROLOL TARTRATE 5 MG/5ML IV SOLN: 2.5000 mg | INTRAVENOUS | Status: AC | PRN

## 2024-08-12 MED ORDER — LIDOCAINE 2% (20 MG/ML) 5 ML SYRINGE
INTRAMUSCULAR | Status: DC | PRN
  Administered 2024-08-12: 80 mg via INTRAVENOUS

## 2024-08-12 MED ORDER — ONDANSETRON HCL 4 MG/2ML IJ SOLN
INTRAMUSCULAR | Status: AC
  Filled 2024-08-12: qty 2

## 2024-08-12 MED ORDER — NEOSTIGMINE METHYLSULFATE 10 MG/10ML IV SOLN
INTRAVENOUS | Status: DC | PRN
  Administered 2024-08-12: 2 mg via INTRAVENOUS

## 2024-08-12 MED ORDER — DEXAMETHASONE SOD PHOSPHATE PF 10 MG/ML IJ SOLN
INTRAMUSCULAR | Status: AC
  Filled 2024-08-12: qty 1

## 2024-08-12 MED ORDER — CEFAZOLIN SODIUM-DEXTROSE 2-4 GM/100ML-% IV SOLN
2.0000 g | INTRAVENOUS | Status: AC
  Administered 2024-08-12: 2 g via INTRAVENOUS
  Filled 2024-08-12: qty 100

## 2024-08-12 MED ORDER — PHENYLEPHRINE 80 MCG/ML (10ML) SYRINGE FOR IV PUSH (FOR BLOOD PRESSURE SUPPORT)
PREFILLED_SYRINGE | INTRAVENOUS | Status: DC | PRN
  Administered 2024-08-12 (×5): 40 ug via INTRAVENOUS

## 2024-08-12 MED ORDER — ONDANSETRON HCL 4 MG/2ML IJ SOLN
INTRAMUSCULAR | Status: DC | PRN
  Administered 2024-08-12: 4 mg via INTRAVENOUS

## 2024-08-12 MED ORDER — LIDOCAINE-EPINEPHRINE (PF) 1 %-1:200000 IJ SOLN: INTRAMUSCULAR | Status: DC | PRN

## 2024-08-12 MED ORDER — LIDOCAINE-EPINEPHRINE (PF) 1 %-1:200000 IJ SOLN
INTRAMUSCULAR | Status: AC
  Filled 2024-08-12: qty 30

## 2024-08-12 MED ORDER — ESMOLOL HCL 100 MG/10ML IV SOLN
INTRAVENOUS | Status: DC | PRN
  Administered 2024-08-12: 30 mg via INTRAVENOUS

## 2024-08-12 MED ORDER — PHENYLEPHRINE 80 MCG/ML (10ML) SYRINGE FOR IV PUSH (FOR BLOOD PRESSURE SUPPORT)
PREFILLED_SYRINGE | INTRAVENOUS | Status: AC
  Filled 2024-08-12: qty 10

## 2024-08-12 MED ORDER — SENNA 8.6 MG PO TABS: 1.0000 | ORAL_TABLET | Freq: Every day | ORAL | Status: AC

## 2024-08-12 MED ORDER — PHENOL 1.4 % MT LIQD: 1.0000 | OROMUCOSAL | Status: AC | PRN

## 2024-08-12 MED ORDER — PHENYLEPHRINE HCL-NACL 20-0.9 MG/250ML-% IV SOLN
INTRAVENOUS | Status: DC | PRN
  Administered 2024-08-12: 30 ug/min via INTRAVENOUS

## 2024-08-12 NOTE — Transfer of Care (Signed)
 Immediate Anesthesia Transfer of Care Note  Patient: Brenda Woods  Procedure(s) Performed: ENDARTERECTOMY, CAROTID Left using xenosure patch. (Left: Neck)  Patient Location: PACU  Anesthesia Type:General  Level of Consciousness: awake, alert , oriented, and patient cooperative  Airway & Oxygen Therapy: Patient Spontanous Breathing and Patient connected to face mask oxygen  Post-op Assessment: Report given to RN, Post -op Vital signs reviewed and stable, and Patient moving all extremities X 4  Post vital signs: Reviewed and stable  Last Vitals:  Vitals Value Taken Time  BP 95/81 08/12/24 10:31  Temp    Pulse 71 08/12/24 10:32  Resp 31 08/12/24 10:32  SpO2 100 % 08/12/24 10:32  Vitals shown include unfiled device data.  Last Pain:  Vitals:   08/12/24 0635  TempSrc:   PainSc: 0-No pain         Complications: No notable events documented.

## 2024-08-12 NOTE — Discharge Instructions (Signed)

## 2024-08-12 NOTE — Plan of Care (Signed)
" °  Problem: Education: Goal: Knowledge of the prescribed therapeutic regimen will improve Outcome: Progressing   Problem: Bowel/Gastric: Goal: Gastrointestinal status for postoperative course will improve Outcome: Progressing   Problem: Cardiac: Goal: Ability to maintain an adequate cardiac output Outcome: Progressing Goal: Will show no evidence of cardiac arrhythmias Outcome: Progressing   Problem: Nutritional: Goal: Will attain and maintain optimal nutritional status Outcome: Progressing   Problem: Neurological: Goal: Will regain or maintain usual level of consciousness Outcome: Progressing   Problem: Clinical Measurements: Goal: Ability to maintain clinical measurements within normal limits Outcome: Progressing Goal: Postoperative complications will be avoided or minimized Outcome: Progressing   Problem: Respiratory: Goal: Will regain and/or maintain adequate ventilation Outcome: Progressing Goal: Respiratory status will improve Outcome: Progressing   Problem: Skin Integrity: Goal: Demonstrates signs of wound healing without infection Outcome: Progressing   Problem: Urinary Elimination: Goal: Will remain free from infection Outcome: Progressing Goal: Ability to achieve and maintain adequate urine output Outcome: Progressing   Problem: Education: Goal: Knowledge of General Education information will improve Description: Including pain rating scale, medication(s)/side effects and non-pharmacologic comfort measures Outcome: Progressing   Problem: Clinical Measurements: Goal: Ability to maintain clinical measurements within normal limits will improve Outcome: Progressing Goal: Will remain free from infection Outcome: Progressing Goal: Diagnostic test results will improve Outcome: Progressing Goal: Respiratory complications will improve Outcome: Progressing Goal: Cardiovascular complication will be avoided Outcome: Progressing   Problem: Activity: Goal: Risk  for activity intolerance will decrease Outcome: Progressing   Problem: Nutrition: Goal: Adequate nutrition will be maintained Outcome: Progressing   Problem: Coping: Goal: Level of anxiety will decrease Outcome: Progressing   Problem: Elimination: Goal: Will not experience complications related to bowel motility Outcome: Progressing Goal: Will not experience complications related to urinary retention Outcome: Progressing   Problem: Pain Managment: Goal: General experience of comfort will improve and/or be controlled Outcome: Progressing   Problem: Safety: Goal: Ability to remain free from injury will improve Outcome: Progressing   Problem: Skin Integrity: Goal: Risk for impaired skin integrity will decrease Outcome: Progressing   Problem: Education: Goal: Knowledge of discharge needs will improve Outcome: Progressing   Problem: Clinical Measurements: Goal: Postoperative complications will be avoided or minimized Outcome: Progressing   Problem: Respiratory: Goal: Will achieve and/or maintain a regular respiratory rate, without signs or symptoms of dyspnea Outcome: Progressing   Problem: Skin Integrity: Goal: Demonstration of wound healing without infection will improve Outcome: Progressing   "

## 2024-08-12 NOTE — Progress Notes (Signed)
" °   08/12/24 1407  SDOH Interventions  Social Connections Interventions Inpatient TOC;Other (Comment) ([facility] resources added to AVS)    "

## 2024-08-12 NOTE — Anesthesia Procedure Notes (Signed)
 Procedure Name: Intubation Date/Time: 08/12/2024 7:45 AM  Performed by: Claudene Arlin LABOR, CRNAPre-anesthesia Checklist: Patient identified, Emergency Drugs available, Suction available and Patient being monitored Patient Re-evaluated:Patient Re-evaluated prior to induction Oxygen Delivery Method: Circle system utilized Preoxygenation: Pre-oxygenation with 100% oxygen Induction Type: IV induction Ventilation: Mask ventilation without difficulty Laryngoscope Size: Mac and 3 Grade View: Grade I Tube type: Oral Tube size: 7.0 mm Number of attempts: 1 Airway Equipment and Method: Stylet and Oral airway Placement Confirmation: ETT inserted through vocal cords under direct vision, positive ETCO2 and breath sounds checked- equal and bilateral Secured at: 21 cm Tube secured with: Tape Dental Injury: Teeth and Oropharynx as per pre-operative assessment

## 2024-08-12 NOTE — Op Note (Signed)
 "   NAME: Brenda Woods    MRN: [id number] DOB: 06-17-1956    DATE OF OPERATION: 08/12/2024  PREOP DIAGNOSIS:    Asymptomatic critical left-sided internal carotid artery stenosis  POSTOP DIAGNOSIS:    Same  PROCEDURE:    Left-sided carotid endarterectomy with bovine pericardial patch plasty  SURGEON: Fonda FORBES Rim  ASSIST: Sherrilee Holster, PA  ANESTHESIA: General  EBL: 75 mL  INDICATIONS:    Brenda Woods is a 69 y.o. female with a symptomatic left-sided critical ICA stenosis.  The lesion is focal, and calcific.  After discussing left-sided carotid endarterectomy in an effort to reduce her risk of future stroke, Brenda Woods elected to proceed.  FINDINGS:   Greater than 95% stenosis of the left internal carotid artery due to dense calcific disease at the carotid bifurcation.  TECHNIQUE:   After full informed written consent was obtained from the patient, the patient was brought back to the operating room and placed supine upon the operating table.  Prior to induction, the patient received IV antibiotics.    After obtaining adequate anesthesia, the patient was placed into semi-Fowler position with a shoulder roll in place and the patient's neck slightly hyperextended and rotated away from the surgical site.  The patient was prepped in the standard fashion for a left carotid endarterectomy.  I made an incision anterior to the sternocleidomastoid muscle and dissected down through the subcutaneous tissue.  The platysmas was opened with electrocautery.  Then I dissected down to the internal jugular vein.  This was dissected posteriorly until I obtained visualization of the common carotid artery.  This was dissected out and then an umbilical tape was placed around the common carotid artery and I loosely applied a Rumel tourniquet.  I then dissected in a periadventitial fashion along the common carotid artery up to the bifurcation.  I then identified the external carotid artery and the  superior thyroid  artery.  A 2-0 silk tie was looped around the superior thyroid  artery, and I also dissected out the external carotid artery and placed a vessel loop around it.  In continuing the dissection to the internal carotid artery, I identified the facial vein.  This was ligated and then transected, giving me improved exposure of the internal carotid artery.  In the process of this dissection, the hypoglossal nerve was identified.    I then dissected out the internal carotid artery until I identified an area of soft tissue in the internal carotid artery.  I dissected slightly distal to this area, and placed a vessel loop around the artery.  At this point, we gave the patient a therapeutic bolus of Heparin  intravenously (roughly 100 units/kg).  After waiting 3 minutes, and verifying an ACT greater than 250-which required more heparin , I clamped the internal carotid artery, external carotid artery and then the common carotid artery.  I then made an arteriotomy in the common carotid artery with a 11 blade, and extended the arteriotomy with a Potts scissor down into the common carotid artery, then I carried the arteriotomy through the bifurcation into the internal carotid artery until I reached an area that was not diseased.  At this point, I started the endarterectomy in the common carotid artery with a Cytogeneticist and carried this dissection down into the common carotid artery circumferentially.  Then I transected the plaque at a segment where it was adherent.  I then carried this dissection up into the external carotid artery.  The plaque was extracted by unclamping the  external carotid artery and everting the artery.  The dissection was then carried into the internal carotid artery, extracting the remaining portion of the carotid plaque.  I passed the plaque off the field as a specimen.  I then checked internal carotid artery backbleeding.  This was pulsatile, therefore I elected to forego shunting.   I then spent the next 30 minutes removing intimal flaps and loose debris.  Eventually I reached the point where the residual plaque was densely adherent and any further dissection would compromise the integrity of the wall.  After verifying that there was no more loose intimal flaps or debris, I re-interrogated the entirety of this carotid artery.  At this point, I was satisfied that the minimal remaining disease was densely adherent to the wall and wall integrity was intact.  At this point, I then fashioned a bovine pericardial patch for the geometry of this artery and sewed it in place with two running stitch of 6-0 Prolene, one from each end.  Prior to completing this patch angioplasty, arteries were backbled and the common carotid was filled with heparinized saline. First, I released the clamp on the external carotid artery, then I released it on the common carotid artery.  After waiting a few seconds, I then released it on the internal carotid artery.  I then interrogated this patient's arteries with the continuous Doppler.  The audible waveforms in each artery were consistent with the expected characteristics for each artery.  The Sonosite probe was then sterilely draped and used to interrogate the carotid artery in both longitudinal and transverse views.  At this point, I washed out the wound, and placed thrombin  throughout.  I also gave the patient 50 mg of protamine  to reverse his anticoagulation.   After waiting a few minutes, I removed the thrombin  and Gelfoam and washed out the wound.  The suture line was loose.  Several repair stitches were needed.  Once there was no more active bleeding in the surgical site.  I placed a 50 French JP drain, I then reapproximated the platysma muscle with a running stitch of 3-0 Vicryl.  The skin was then reapproximated with a running subcuticular 4-0 Monocryl stitch.  The skin was then cleaned, dried and Dermabond was used to reinforce the skin closure.  The patient woke  without any problems, neurologically intact.      Fonda FORBES Rim, MD [facility] DATE OF DICTATION:   08/12/2024  "

## 2024-08-12 NOTE — H&P (Signed)
 " Office Note   Patient seen and examined in preop holding.  No complaints. No changes to medication history or physical exam since last seen in clinic. After discussing the risks and benefits of Left CEA, Brenda Woods elected to proceed.   Brenda FORBES Rim MD   CC: Asymptomatic bilateral carotid artery stenosis, left greater than right. Requesting Provider:  No ref. provider found  HPI: Brenda Woods is a 69 y.o. (1955/11/01) female presenting in follow-up for discussion for left-sided carotid endarterectomy  Brenda Woods was initially referred after recent hospitalization for what was believed to be axial overdose of carbamazepine .  Levels were elevated on admission.  Symptoms included ataxia, nausea vomiting, nystagmus.  TIA could not be ruled out but was low on the differential.  She underwent CTA head and neck demonstrating critical stenosis of the left ICA, moderate stenosis of the right.    At the time of her last visit, we discussed carotid endarterectomy pending coronary CT to categorize her cardiac risk.  Brenda Woods had difficulty following through with the coronary CT, and there was some confusion regarding its purpose.  Furthermore, when I called to discuss the importance of it with Brenda Woods, she did not remember our conversation, and was very confused about the need for surgery.  I did not feel comfortable moving forward with surgery regardless of the results until I saw her again in the office due to the significant amount of confusion associated.  Coronary artery calcium  score 745 each ear placing the patient in the 95th percentile for age and gender suggesting high risk for future cardiac events.  Extensive but nonobstructive coronary disease confirmed by CT FFR.  On exam today, Brenda Woods was doing well, accompanied by her friend.  Mental status has improved dramatically with treatment of hyponatremia.  Denies any symptoms of TIA, stroke, amaurosis.  A native of [place], she graduated from  [employer].   History includes epilepsy-well-controlled, last seizure over a decade ago.  The pt is  on a statin for cholesterol management.  The pt is  on a daily aspirin .   Other AC:  plavix  The pt is  on medication for hypertension.   The pt is - diabetic.  Tobacco hx:  -  Past Medical History:  Diagnosis Date   Allergy    Arthritis    elbows   Carotid artery occlusion    Cataract    Neuromuscular disorder (HCC)    Osteopenia    Seizure (HCC)    last seizure 47, onset age 82   Seizure [facility])     Past Surgical History:  Procedure Laterality Date   ANKLE FRACTURE SURGERY Left 07/05/2008    Social History   Socioeconomic History   Marital status: Married    Spouse name: Ozell   Number of children: 0   Years of education: 12   Highest education level: Not on file  Occupational History    Employer: [employer]  Tobacco Use   Smoking status: Never   Smokeless tobacco: Never  Vaping Use   Vaping status: Never Used  Substance and Sexual Activity   Alcohol use: No   Drug use: Never   Sexual activity: Not Currently  Other Topics Concern   Not on file  Social History Narrative   Patient is married to [place]. 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 Patient has a high school education.   Patient is  right-handed.   Patient drinks about 2 glasses of soda daily.   Social Drivers of Health   Tobacco Use: Low Risk (08/12/2024)   Patient History    Smoking Tobacco Use: Never    Smokeless Tobacco Use: Never    Passive Exposure: Not on file  Financial Resource Strain: Low Risk (05/07/2023)   Overall Financial Resource Strain (CARDIA)    Difficulty of Paying Living Expenses: Not hard at all  Food Insecurity: No Food Insecurity (04/06/2024)   Epic    Worried About Programme Researcher, Broadcasting/film/video in the Last Year: Never true    Ran Out of Food in the Last Year: Never true   Transportation Needs: No Transportation Needs (04/06/2024)   Epic    Lack of Transportation (Medical): No    Lack of Transportation (Non-Medical): No  Physical Activity: Sufficiently Active (05/07/2023)   Exercise Vital Sign    Days of Exercise per Week: 5 days    Minutes of Exercise per Session: 40 min  Stress: No Stress Concern Present (05/07/2023)   [employer] of Occupational Health - Occupational Stress Questionnaire    Feeling of Stress : Not at all  Social Connections: Socially Isolated (04/06/2024)   Social Connection and Isolation Panel    Frequency of Communication with Friends and Family: Twice a week    Frequency of Social Gatherings with Friends and Family: Three times a week    Attends Religious Services: Never    Active Member of Clubs or Organizations: No    Attends Banker Meetings: Never    Marital Status: Widowed  Intimate Partner Violence: Not At Risk (05/07/2023)   Humiliation, Afraid, Rape, and Kick questionnaire    Fear of Current or Ex-Partner: No    Emotionally Abused: No    Physically Abused: No    Sexually Abused: No  Depression (PHQ2-9): Low Risk (06/23/2024)   Depression (PHQ2-9)    PHQ-2 Score: 0  Alcohol Screen: Low Risk (05/07/2023)   Alcohol Screen    Last Alcohol Screening Score (AUDIT): 0  Housing: Low Risk (04/06/2024)   Epic    Unable to Pay for Housing in the Last Year: No    Number of Times Moved in the Last Year: 0    Homeless in the Last Year: No  Utilities: Not At Risk (04/06/2024)   Epic    Threatened with loss of utilities: No  Health Literacy: Adequate Health Literacy (05/07/2023)   B1300 Health Literacy    Frequency of need for help with medical instructions: Never   Family History  Problem Relation Age of Onset   Alzheimer's disease Father    Breast cancer Maternal Aunt    Colon cancer Neg Hx    Esophageal cancer Neg Hx    Rectal cancer Neg Hx    Stomach cancer Neg Hx     Current Facility-Administered  Medications  Medication Dose Route Frequency Provider Last Rate Last Admin   0.9 %  sodium chloride  infusion   Intravenous Continuous Mansi Tokar E, MD       ceFAZolin  (ANCEF ) IVPB 2g/100 mL premix  2 g Intravenous 30 min Pre-Op Nishika Parkhurst E, MD       Chlorhexidine  Gluconate Cloth 2 % PADS 6 each  6 each Topical Once Elynor Kallenberger E, MD       And   Chlorhexidine  Gluconate Cloth 2 % PADS 6 each  6 each Topical Once Renezmae Canlas E, MD       remifentanil  (ULTIVA ) 2  mg in 100 mL normal saline (20 mcg/mL) Optime  0.15 mcg/kg/min Intravenous To OR Colhoun, Lauraine DASEN, MD       Facility-Administered Medications Ordered in Other Encounters  Medication Dose Route Frequency Provider Last Rate Last Admin   lactated ringers  infusion   Intravenous Continuous PRN Smith, Alyssia A, CRNA   New Bag at 08/12/24 9354   lactated ringers  infusion   Intravenous Continuous PRN Smith, Alyssia A, CRNA   New Bag at 08/12/24 [phone]    No Known Allergies   REVIEW OF SYSTEMS:  [X]  denotes positive finding, [ ]  denotes negative finding Cardiac  Comments:  Chest pain or chest pressure:    Shortness of breath upon exertion:    Short of breath when lying flat:    Irregular heart rhythm:        Vascular    Pain in calf, thigh, or hip brought on by ambulation:    Pain in feet at night that wakes you up from your sleep:     Blood clot in your veins:    Leg swelling:         Pulmonary    Oxygen at home:    Productive cough:     Wheezing:         Neurologic    Sudden weakness in arms or legs:     Sudden numbness in arms or legs:     Sudden onset of difficulty speaking or slurred speech:    Temporary loss of vision in one eye:     Problems with dizziness:         Gastrointestinal    Blood in stool:     Vomited blood:         Genitourinary    Burning when urinating:     Blood in urine:        Psychiatric    Major depression:         Hematologic    Bleeding problems:    Problems with blood  clotting too easily:        Skin    Rashes or ulcers:        Constitutional    Fever or chills:      PHYSICAL EXAMINATION:  Vitals:   08/12/24 0556  BP: (!) 166/69  Pulse: 64  Resp: 20  Temp: 97.6 F (36.4 C)  TempSrc: Oral  SpO2: 98%  Weight: 48.5 kg  Height: 5' 4 (1.626 m)     General:  WDWN in NAD; vital signs documented above Gait: Not observed HENT: WNL, normocephalic Pulmonary: normal non-labored breathing , without wheezing Cardiac: regular HR Abdomen: soft, NT, no masses Skin: without rashes Vascular Exam/Pulses:  Right Left  Radial 2+ (normal) 2+ (normal)  Ulnar    Femoral    Popliteal    DP    PT     Extremities: without ischemic changes, without Gangrene , without cellulitis; without open wounds;  Musculoskeletal: no muscle wasting or atrophy  Neurologic: A&O X 3;  No focal weakness or paresthesias are detected Psychiatric:  The pt has Normal affect.   Non-Invasive Vascular Imaging:   Ultrasound demonstrated no significant stenosis.  I question the validity due to CTA findings demonstrating high-grade stenosis.    ASSESSMENT/PLAN: Brenda Woods is a 69 y.o. female presenting with asymptomatic bilateral carotid artery stenosis, left greater than right.  While there is some discrepancy between the ultrasound and CT angio, the CT angio demonstrates a 90% stenosis.  I had a  long discussion with Brenda Woods regarding the above, most notably that with 90% stenosis of her left ICA, she has a 11% risk of stroke over the next 5 years with best medical management.  With surgery, this lowers to 5%.  In evaluating her imaging, I think that she is an excellent candidate for carotid endarterectomy.  I think she is a poor candidate for TCAR.  After discussing risks and benefits of both, she elected to proceed with carotid endarterectomy.  I asked that she continue aspirin , Plavix , high intensity statin.  Brenda FORBES Rim, MD [facility] [phone]  "

## 2024-08-12 NOTE — Anesthesia Procedure Notes (Signed)
 Arterial Line Insertion Start/End2/12/2024 6:50 AM, 08/12/2024 7:10 AM Performed by: Dene Lauraine DASEN, MD, anesthesiologist  Patient location: Pre-op. Preanesthetic checklist: patient identified, IV checked, site marked, risks and benefits discussed, surgical consent, monitors and equipment checked, pre-op evaluation, timeout performed and anesthesia consent Lidocaine  1% used for infiltration Left, radial was placed Catheter size: 20 G Hand hygiene performed  and maximum sterile barriers used  Allen's test indicative of satisfactory collateral circulation Attempts: 2 Procedure performed using ultrasound to evaluate access site. Ultrasound Notes:relevant anatomy identified, ultrasound used to visualize needle entry and vessel patent under ultrasound. Following insertion, dressing applied and Biopatch. Post procedure assessment: normal and unchanged  Post procedure complications: second provider assisted. Patient tolerated the procedure well with no immediate complications.

## 2024-08-12 NOTE — Anesthesia Preprocedure Evaluation (Addendum)
"                                    Anesthesia Evaluation  Patient identified by MRN, date of birth, ID band Patient awake    Reviewed: Allergy & Precautions, NPO status , Patient's Chart, lab work & pertinent test results  History of Anesthesia Complications Negative for: history of anesthetic complications  Airway Mallampati: II  TM Distance: >3 FB Neck ROM: Full    Dental  (+) Teeth Intact, Dental Advisory Given   Pulmonary    breath sounds clear to auscultation       Cardiovascular + CAD (CTA 06/21/2024 showed extensive but nonobstructive CAD confirmed by FFR.) and + Peripheral Vascular Disease (Bilateral Carotid Stenosis)   Rhythm:Regular Rate:Normal   EKG (2026): NSR (?Hx of 1st degree AV Block)   TTE (04/2024): 1. Left ventricular ejection fraction, by estimation, is 65 to 70%. Left  ventricular ejection fraction by 2D MOD biplane is 68.4 %. The left  ventricle has normal function. The left ventricle has no regional wall  motion abnormalities. Left ventricular  diastolic parameters were normal.   2. Right ventricular systolic function is normal. The right ventricular  size is normal. There is normal pulmonary artery systolic pressure. The  estimated right ventricular systolic pressure is 24.0 mmHg.   3. The mitral valve is normal in structure. No evidence of mitral valve  regurgitation.   4. The aortic valve is tricuspid. Aortic valve regurgitation is not  visualized. Aortic valve sclerosis/calcification is present, without any  evidence of aortic stenosis. Aortic valve mean gradient measures 4.0 mmHg.   5. The inferior vena cava is normal in size with <50% respiratory  variability, suggesting right atrial pressure of 8 mmHg.      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  (Baseline 122-125)        Musculoskeletal  (+) Arthritis ,    Abdominal   Peds  Hematology  (+) Blood dyscrasia  Medication-induced Leukopenia (WBC 3.6)  Hgb 11.9, Plts 276K (08/04/24)     Anesthesia Other Findings   Reproductive/Obstetrics                              Anesthesia Physical Anesthesia Plan  ASA: 4  Anesthesia Plan: General   Post-op Pain Management:    Induction:   PONV Risk Score and Plan: 2 and Ondansetron  and Dexamethasone   Airway Management Planned: Oral ETT  Additional Equipment: Arterial line  Intra-op Plan:   Post-operative Plan: Extubation in OR  Informed Consent: I have reviewed the patients History and Physical, chart, labs and discussed the procedure including the risks, benefits and alternatives for the proposed anesthesia with the patient or authorized representative who has indicated his/her understanding and acceptance.     Dental advisory given  Plan Discussed with: CRNA  Anesthesia Plan Comments:          Anesthesia Quick Evaluation  "

## 2024-09-06 ENCOUNTER — Ambulatory Visit: Admitting: Cardiology

## 2025-05-16 ENCOUNTER — Ambulatory Visit: Admitting: Family Medicine
